# Patient Record
Sex: Female | Born: 1965
Health system: Southern US, Community
[De-identification: ages and names within clinical notes are randomized; demographics above are authoritative.]

## PROBLEM LIST (undated history)

## (undated) DIAGNOSIS — D649 Anemia, unspecified: Secondary | ICD-10-CM

## (undated) DIAGNOSIS — F419 Anxiety disorder, unspecified: Secondary | ICD-10-CM

## (undated) DIAGNOSIS — M1990 Unspecified osteoarthritis, unspecified site: Secondary | ICD-10-CM

## (undated) DIAGNOSIS — J189 Pneumonia, unspecified organism: Secondary | ICD-10-CM

## (undated) DIAGNOSIS — T7840XA Allergy, unspecified, initial encounter: Secondary | ICD-10-CM

## (undated) DIAGNOSIS — E039 Hypothyroidism, unspecified: Secondary | ICD-10-CM

## (undated) DIAGNOSIS — Z9889 Other specified postprocedural states: Secondary | ICD-10-CM

## (undated) DIAGNOSIS — F329 Major depressive disorder, single episode, unspecified: Secondary | ICD-10-CM

## (undated) DIAGNOSIS — I10 Essential (primary) hypertension: Secondary | ICD-10-CM

## (undated) DIAGNOSIS — R7303 Prediabetes: Secondary | ICD-10-CM

## (undated) DIAGNOSIS — R112 Nausea with vomiting, unspecified: Secondary | ICD-10-CM

## (undated) DIAGNOSIS — O24419 Gestational diabetes mellitus in pregnancy, unspecified control: Secondary | ICD-10-CM

## (undated) DIAGNOSIS — F32A Depression, unspecified: Secondary | ICD-10-CM

## (undated) HISTORY — DX: Gestational diabetes mellitus in pregnancy, unspecified control: O24.419

## (undated) HISTORY — DX: Allergy, unspecified, initial encounter: T78.40XA

## (undated) HISTORY — DX: Anemia, unspecified: D64.9

---

## 2013-02-06 ENCOUNTER — Encounter: Payer: Self-pay | Admitting: Internal Medicine

## 2013-02-12 ENCOUNTER — Ambulatory Visit (INDEPENDENT_AMBULATORY_CARE_PROVIDER_SITE_OTHER): Payer: 59 | Admitting: Internal Medicine

## 2013-02-12 ENCOUNTER — Encounter: Payer: Self-pay | Admitting: Internal Medicine

## 2013-02-12 VITALS — BP 150/90 | HR 83 | Temp 98.2°F | Ht 65.5 in | Wt 193.5 lb

## 2013-02-12 DIAGNOSIS — D649 Anemia, unspecified: Secondary | ICD-10-CM

## 2013-02-12 DIAGNOSIS — R030 Elevated blood-pressure reading, without diagnosis of hypertension: Secondary | ICD-10-CM

## 2013-02-12 DIAGNOSIS — E7800 Pure hypercholesterolemia, unspecified: Secondary | ICD-10-CM

## 2013-02-12 DIAGNOSIS — O24419 Gestational diabetes mellitus in pregnancy, unspecified control: Secondary | ICD-10-CM

## 2013-02-12 DIAGNOSIS — O99810 Abnormal glucose complicating pregnancy: Secondary | ICD-10-CM

## 2013-02-12 DIAGNOSIS — Z1239 Encounter for other screening for malignant neoplasm of breast: Secondary | ICD-10-CM

## 2013-02-15 ENCOUNTER — Telehealth: Payer: Self-pay | Admitting: Internal Medicine

## 2013-02-15 ENCOUNTER — Encounter: Payer: Self-pay | Admitting: Internal Medicine

## 2013-02-15 DIAGNOSIS — D649 Anemia, unspecified: Secondary | ICD-10-CM | POA: Insufficient documentation

## 2013-02-15 DIAGNOSIS — I10 Essential (primary) hypertension: Secondary | ICD-10-CM | POA: Insufficient documentation

## 2013-02-15 DIAGNOSIS — Z8632 Personal history of gestational diabetes: Secondary | ICD-10-CM | POA: Insufficient documentation

## 2013-02-15 NOTE — Assessment & Plan Note (Signed)
Elevated today.  Had issues during pregnancy.  Has not seen a doctor regularly.  Have her spot check her pressures.  Get her back in soon to reassess.  If persistent elevation, will require medication.  Check metabolic panel.

## 2013-02-15 NOTE — Assessment & Plan Note (Signed)
Noted as a child.  Check cbc.

## 2013-02-15 NOTE — Progress Notes (Signed)
Subjective:    Patient ID: Kristen Aguilar, female    DOB: [birth year], 47 y.o.   MRN: [id number]  HPI 47 year old female with past history of allergy problems as well as borderline blood pressure and sugar issues with pregnancy.  She comes in today to follow up on these issues as well as to establish care.  Daughter of Kristen Aguilar and Kristen Aguilar.  States her last physical was 10 years ago.  She has had one previous abnormal mammogram and was told she was drinking too much caffeine.  Has not had mammogram since.  Had borderline blood pressure and sugar during her pregnancy.  Also was told she was anemic - as a child.  Tries to stay active.  No cardiac symptoms with increased activity or exertion.  Breathing stable.  Bowels stable.  She is still having periods.  States she will have spotting approximately every three months.  Has been doing this for two years.  Has had a tubal ligation.  Some increased stress with her sons school issues.  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  Doing better now.  She feels she is handling stress relatively well now.     Past Medical History  Diagnosis Date  . Allergy   . Gestational diabetes   . Anemia     as a child    Outpatient Encounter Prescriptions as of 02/12/2013  Medication Sig Dispense Refill  . fish oil-omega-3 fatty acids 1000 MG capsule Take 2 g by mouth daily.      . Multiple Vitamin (MULTIVITAMIN WITH MINERALS) TABS Take 1 tablet by mouth daily.       No facility-administered encounter medications on file as of 02/12/2013.    Review of Systems Patient denies any headache, lightheadedness or dizziness.  No fever.  No significant sinus or allergy symptoms currently.  No chest pain, tightness or palpitations.  No increased shortness of breath, cough or congestion.  No nausea or vomiting.  No acid reflux. No abdominal pain or cramping.  No bowel change, such as diarrhea, constipation, BRBPR or melana.  No urine change.  No joint aches or rash.  Periods as outlined.   No vaginal problems.  Feels she is handling stress relatively well.       Objective:   Physical Exam Filed Vitals:   02/12/13 0913  BP: 150/90  Pulse: 83  Temp: 98.2 F (36.8 C)   Blood pressure recheck:  148/90, pulse 34  47 year old female in no acute distress.   HEENT:  Nares- clear.  Oropharynx - without lesions. NECK:  Supple.  Nontender.  No audible bruit.  HEART:  Appears to be regular. LUNGS:  No crackles or wheezing audible.  Respirations even and unlabored.  RADIAL PULSE:  Equal bilaterally.  ABDOMEN:  Soft, nontender.  Bowel sounds present and normal.  No audible abdominal bruit.    EXTREMITIES:  No increased edema present.  DP pulses palpable and equal bilaterally.      SKIN:  Tick present posterior right shoulder.  Minimal surrounding erythema.      Assessment & Plan:  GYN.  Periods as outlined.  Overdue pelvic/pap.  Schedule her for a physical next visit.   INCREASED PSYCHOSOCIAL STRESSORS.  Feels she is handling things relatively well.  Follow.   TICK BITE.  Attempted tick removal here in the office. Was able to remove all of the tick except for the head.  Discussed with surgery.  They will see her today for complete removal.  She tolerated procedure well.  Will monitor for fever, headaches, rash, joint aches, etc.    HEALTH MAINTENANCE.  Schedule a physical for next visit.  Schedule a mammogram.  Check labs including cholesterol.

## 2013-02-15 NOTE — Assessment & Plan Note (Signed)
Discussed importance of diet and weight loss.  Check metabolic panel and a1c.

## 2013-02-15 NOTE — Telephone Encounter (Signed)
Please schedule her for a fasting lab appt within the next 1-2 weeks and call her with an appt date and time.  Thanks.

## 2013-02-17 NOTE — Telephone Encounter (Signed)
Patient called back and is scheduled for 02/26/13 at 10:30.

## 2013-02-17 NOTE — Telephone Encounter (Signed)
Left message for pt to call office

## 2013-02-23 ENCOUNTER — Encounter: Payer: Self-pay | Admitting: Internal Medicine

## 2013-02-26 ENCOUNTER — Other Ambulatory Visit (INDEPENDENT_AMBULATORY_CARE_PROVIDER_SITE_OTHER): Payer: 59

## 2013-02-26 DIAGNOSIS — E7800 Pure hypercholesterolemia, unspecified: Secondary | ICD-10-CM

## 2013-02-26 DIAGNOSIS — R030 Elevated blood-pressure reading, without diagnosis of hypertension: Secondary | ICD-10-CM

## 2013-02-26 LAB — COMPREHENSIVE METABOLIC PANEL
ALT: 16 U/L (ref 0–35)
AST: 19 U/L (ref 0–37)
CO2: 26 mEq/L (ref 19–32)
Calcium: 9.3 mg/dL (ref 8.4–10.5)
Chloride: 103 mEq/L (ref 96–112)
Creatinine, Ser: 0.9 mg/dL (ref 0.4–1.2)
Sodium: 138 mEq/L (ref 135–145)
Total Protein: 7.4 g/dL (ref 6.0–8.3)

## 2013-02-26 LAB — LIPID PANEL
HDL: 51.6 mg/dL (ref >39.00)
Total CHOL/HDL Ratio: 4
Triglycerides: 120 mg/dL (ref 0.0–149.0)

## 2013-02-26 LAB — CBC WITH DIFFERENTIAL/PLATELET
Basophils Absolute: 0 10*3/uL (ref 0.0–0.1)
Eosinophils Absolute: 0.3 10*3/uL (ref 0.0–0.7)
Hemoglobin: 14.2 g/dL (ref 12.0–15.0)
Lymphocytes Relative: 26.4 % (ref 12.0–46.0)
MCHC: 34 g/dL (ref 30.0–36.0)
Monocytes Relative: 6 % (ref 3.0–12.0)
Neutro Abs: 6 10*3/uL (ref 1.4–7.7)
Neutrophils Relative %: 64.4 % (ref 43.0–77.0)
Platelets: 306 10*3/uL (ref 150.0–400.0)
RDW: 13.4 % (ref 11.5–14.6)

## 2013-03-02 ENCOUNTER — Encounter: Payer: Self-pay | Admitting: *Deleted

## 2013-03-12 ENCOUNTER — Encounter: Payer: Self-pay | Admitting: Internal Medicine

## 2013-04-02 ENCOUNTER — Ambulatory Visit (INDEPENDENT_AMBULATORY_CARE_PROVIDER_SITE_OTHER): Payer: 59 | Admitting: Adult Health

## 2013-04-02 ENCOUNTER — Encounter: Payer: Self-pay | Admitting: Adult Health

## 2013-04-02 VITALS — BP 158/78 | HR 78 | Temp 98.1°F | Resp 12 | Wt 192.5 lb

## 2013-04-02 DIAGNOSIS — W268XXA Contact with other sharp object(s), not elsewhere classified, initial encounter: Secondary | ICD-10-CM

## 2013-04-02 DIAGNOSIS — Z23 Encounter for immunization: Secondary | ICD-10-CM

## 2013-04-02 DIAGNOSIS — S91309A Unspecified open wound, unspecified foot, initial encounter: Secondary | ICD-10-CM

## 2013-04-02 DIAGNOSIS — S91331A Puncture wound without foreign body, right foot, initial encounter: Secondary | ICD-10-CM

## 2013-04-02 DIAGNOSIS — S91339A Puncture wound without foreign body, unspecified foot, initial encounter: Secondary | ICD-10-CM | POA: Insufficient documentation

## 2013-04-02 NOTE — Patient Instructions (Signed)
  Tdap vaccine given today.  You were given a separate handout related to this vaccine.

## 2013-04-02 NOTE — Assessment & Plan Note (Signed)
Occurred while gardening. Pt stepped on rusty nail and punctured the right heel. Continue with topical antibacterial ointment. Keep area clean. Tdap given. Report any s/s of infection such as fever, redness or swelling around the area, drainage.

## 2013-04-02 NOTE — Progress Notes (Signed)
  Subjective:    Patient ID: Kristen Aguilar, female    DOB: 1966/05/03, 47 y.o.   MRN: [id number]  HPI  Patient is a pleasant 47 year old female who presents to clinic after stepping on a rusty nail while gardening. She had shoes on. The nail went through the shoe and broke the skin on her right heel. She removed the nail and noticed the area was bleeding. She cleaned it up and apply topical antibacterial ointment and covered it with a Band-Aid. Patient has no other complaints. Reports last tetanus shot > 10 years ago.   Current Outpatient Prescriptions on File Prior to Visit  Medication Sig Dispense Refill  . fish oil-omega-3 fatty acids 1000 MG capsule Take 2 g by mouth daily.      . Multiple Vitamin (MULTIVITAMIN WITH MINERALS) TABS Take 1 tablet by mouth daily.       No current facility-administered medications on file prior to visit.     Review of Systems Skin - right heel injury after stepping on a rusty nail yesterday. Area was bleeding at the time. Denies bleeding now. Denies any other drainage.  BP 158/78  Pulse 78  Temp(Src) 98.1 F (36.7 C) (Oral)  Resp 12  Wt 192 lb 8 oz (87.317 kg)  BMI 31.53 kg/m2  SpO2 98%     Objective:   Physical Exam  Skin: Right heel puncture wound w/o evidence of infection. There is no erythema surrounding the area. There is no edema. Pt is afebrile.     Assessment & Plan:

## 2013-04-03 ENCOUNTER — Encounter: Payer: 59 | Admitting: Internal Medicine

## 2013-04-30 ENCOUNTER — Encounter: Payer: Self-pay | Admitting: Internal Medicine

## 2013-05-15 ENCOUNTER — Encounter: Payer: 59 | Admitting: Internal Medicine

## 2013-06-18 ENCOUNTER — Encounter: Payer: 59 | Admitting: Internal Medicine

## 2013-07-07 ENCOUNTER — Encounter: Payer: Self-pay | Admitting: Internal Medicine

## 2013-07-07 ENCOUNTER — Other Ambulatory Visit (HOSPITAL_COMMUNITY)
Admission: RE | Admit: 2013-07-07 | Discharge: 2013-07-07 | Disposition: A | Discharge status: Home / Self Care | Payer: 59 | Source: Ambulatory Visit | Attending: Internal Medicine | Admitting: Internal Medicine

## 2013-07-07 ENCOUNTER — Ambulatory Visit (INDEPENDENT_AMBULATORY_CARE_PROVIDER_SITE_OTHER): Payer: 59 | Admitting: Internal Medicine

## 2013-07-07 VITALS — BP 130/100 | HR 76 | Temp 98.2°F | Ht 65.25 in | Wt 201.5 lb

## 2013-07-07 DIAGNOSIS — Z124 Encounter for screening for malignant neoplasm of cervix: Secondary | ICD-10-CM

## 2013-07-07 DIAGNOSIS — Z1151 Encounter for screening for human papillomavirus (HPV): Secondary | ICD-10-CM | POA: Insufficient documentation

## 2013-07-07 DIAGNOSIS — D649 Anemia, unspecified: Secondary | ICD-10-CM

## 2013-07-07 DIAGNOSIS — O99810 Abnormal glucose complicating pregnancy: Secondary | ICD-10-CM

## 2013-07-07 DIAGNOSIS — Z01419 Encounter for gynecological examination (general) (routine) without abnormal findings: Secondary | ICD-10-CM | POA: Insufficient documentation

## 2013-07-07 DIAGNOSIS — O24419 Gestational diabetes mellitus in pregnancy, unspecified control: Secondary | ICD-10-CM

## 2013-07-07 DIAGNOSIS — I10 Essential (primary) hypertension: Secondary | ICD-10-CM

## 2013-07-07 MED ORDER — LISINOPRIL 10 MG PO TABS: 10.0000 mg | ORAL_TABLET | Freq: Every day | ORAL | Status: DC

## 2013-07-07 NOTE — Progress Notes (Signed)
  Subjective:    Patient ID: Kristen Aguilar, female    DOB: [birth year], 47 y.o.   MRN: [id number]  HPI 47 year old female with past history of allergy problems as well as borderline blood pressure and sugar issues with pregnancy.  She comes in today to follow up on these issues as well as for a complete physical exam.  Had borderline blood pressure and sugar during her pregnancy.  Also was told she was anemic - as a child.  Tries to stay active.  No cardiac symptoms with increased activity or exertion.  Breathing stable.  Bowels stable.  She is still having periods.  LMP 6/14.  Has periods every two to three months.   Has had a tubal ligation.   She feels she is handling stress relatively well now.  States her blood pressure is averaging 120-130/90-95.  LMP 6/14.     Past Medical History  Diagnosis Date  . Allergy   . Gestational diabetes   . Anemia     as a child    Outpatient Encounter Prescriptions as of 07/07/2013  Medication Sig Dispense Refill  . fish oil-omega-3 fatty acids 1000 MG capsule Take 2 g by mouth daily.      . Multiple Vitamin (MULTIVITAMIN WITH MINERALS) TABS Take 1 tablet by mouth daily.       No facility-administered encounter medications on file as of 07/07/2013.    Review of Systems Patient denies any headache, lightheadedness or dizziness.  No fever.  No significant sinus or allergy symptoms currently.  No chest pain, tightness or palpitations.  No increased shortness of breath, cough or congestion.  No nausea or vomiting.  No acid reflux. No abdominal pain or cramping.  No bowel change, such as diarrhea, constipation, BRBPR or melana.  No urine change.  No joint aches or rash.  Periods as outlined.  No vaginal problems.  Feels she is handling stress relatively well.       Objective:   Physical Exam  Filed Vitals:   07/07/13 0950  BP: 130/100  Pulse: 76  Temp: 98.2 F (36.8 C)   Blood pressure recheck:  146/84, pulse 37  46 year old female in no acute  distress.   HEENT:  Nares- clear.  Oropharynx - without lesions. NECK:  Supple.  Nontender.  No audible bruit.  HEART:  Appears to be regular. LUNGS:  No crackles or wheezing audible.  Respirations even and unlabored.  RADIAL PULSE:  Equal bilaterally.    BREASTS:  No nipple discharge or nipple retraction present.  Could not appreciate any distinct nodules or axillary adenopathy.  ABDOMEN:  Soft, nontender.  Bowel sounds present and normal.  No audible abdominal bruit.  GU:  Normal external genitalia.  Vaginal vault without lesions.  Cervix identified.  Pap performed. Could not appreciate any adnexal masses or tenderness.   EXTREMITIES:  No increased edema present.  DP pulses palpable and equal bilaterally.          Assessment & Plan:  GYN.  Periods as outlined.  Pelvic/ pap today.   INCREASED PSYCHOSOCIAL STRESSORS.  Feels she is handling things relatively well.  Follow.     HEALTH MAINTENANCE.  Physical today.  Pap performed.  Mammogram 03/11/13 - Bir[facility] I.

## 2013-07-09 ENCOUNTER — Encounter: Payer: Self-pay | Admitting: *Deleted

## 2013-07-11 ENCOUNTER — Encounter: Payer: Self-pay | Admitting: Internal Medicine

## 2013-07-11 NOTE — Assessment & Plan Note (Signed)
Blood pressure elevated.  Start lisinopril 10mg  q day.  Follow pressures.  Get her back in soon to reassess.  Check met b in 10-14 days.

## 2013-07-11 NOTE — Assessment & Plan Note (Signed)
Discussed importance of diet and weight loss.  Follow metabolic panel and a1c.

## 2013-07-11 NOTE — Assessment & Plan Note (Addendum)
Noted as a child.  Recent hgb 14 (5/14).

## 2013-08-07 ENCOUNTER — Encounter: Payer: Self-pay | Admitting: Internal Medicine

## 2013-08-07 ENCOUNTER — Ambulatory Visit (INDEPENDENT_AMBULATORY_CARE_PROVIDER_SITE_OTHER): Payer: 59 | Admitting: Internal Medicine

## 2013-08-07 VITALS — BP 130/80 | HR 75 | Temp 98.0°F | Ht 65.25 in | Wt 198.0 lb

## 2013-08-07 DIAGNOSIS — D649 Anemia, unspecified: Secondary | ICD-10-CM

## 2013-08-07 DIAGNOSIS — I10 Essential (primary) hypertension: Secondary | ICD-10-CM

## 2013-08-07 DIAGNOSIS — O24419 Gestational diabetes mellitus in pregnancy, unspecified control: Secondary | ICD-10-CM

## 2013-08-07 DIAGNOSIS — O99810 Abnormal glucose complicating pregnancy: Secondary | ICD-10-CM

## 2013-08-07 LAB — BASIC METABOLIC PANEL
BUN: 13 mg/dL (ref 6–23)
Calcium: 9.5 mg/dL (ref 8.4–10.5)
Creatinine, Ser: 0.9 mg/dL (ref 0.4–1.2)
GFR: 74.67 mL/min (ref >60.00)
Glucose, Bld: 100 mg/dL — ABNORMAL HIGH (ref 70–99)

## 2013-08-08 ENCOUNTER — Encounter: Payer: Self-pay | Admitting: Internal Medicine

## 2013-08-10 ENCOUNTER — Encounter: Payer: Self-pay | Admitting: Internal Medicine

## 2013-08-10 NOTE — Telephone Encounter (Signed)
Mailed unread message to pt  

## 2013-08-10 NOTE — Assessment & Plan Note (Signed)
Noted as a child.  Recent hgb 14 (5/14).

## 2013-08-10 NOTE — Progress Notes (Signed)
  Subjective:    Patient ID: Kristen Aguilar, female    DOB: Feb 25, 1966, 47 y.o.   MRN: [id number]  HPI 47 year old female with past history of allergy problems and gestational diabetes.  She was just recently diagnosed with hypertension and started on Lisinopril.  Had borderline blood pressure and sugar during her pregnancy.  Tries to stay active.  No cardiac symptoms with increased activity or exertion.  Breathing stable.  Bowels stable.  She is still having periods.  Has periods every two to three months.   Has had a tubal ligation.   She feels she is handling stress relatively well now.  States her blood pressure is averaging 113-120s/70s.  Tolerating Lisinopril.       Past Medical History  Diagnosis Date  . Allergy   . Gestational diabetes   . Anemia     as a child    Outpatient Encounter Prescriptions as of 08/07/2013  Medication Sig Dispense Refill  . fish oil-omega-3 fatty acids 1000 MG capsule Take 2 g by mouth daily.      Marland Kitchen lisinopril (PRINIVIL,ZESTRIL) 10 MG tablet Take 1 tablet (10 mg total) by mouth daily.  30 tablet  1  . Multiple Vitamin (MULTIVITAMIN WITH MINERALS) TABS Take 1 tablet by mouth daily.       No facility-administered encounter medications on file as of 08/07/2013.    Review of Systems Patient denies any headache, lightheadedness or dizziness.  No fever.  No significant sinus or allergy symptoms currently.  No chest pain, tightness or palpitations.  No increased shortness of breath, cough or congestion.  No nausea or vomiting.  No acid reflux. No abdominal pain or cramping.  No bowel change, such as diarrhea, constipation, BRBPR or melana.  No urine change.  Feels she is handling stress relatively well.       Objective:   Physical Exam  Filed Vitals:   08/07/13 1003  BP: 130/80  Pulse: 75  Temp: 98 F (36.7 C)   Blood pressure recheck:  70/18  47 year old female in no acute distress.   HEENT:  Nares- clear.  Oropharynx - without lesions. NECK:   Supple.  Nontender.  No audible bruit.  HEART:  Appears to be regular. LUNGS:  No crackles or wheezing audible.  Respirations even and unlabored.  RADIAL PULSE:  Equal bilaterally.    ABDOMEN:  Soft, nontender.  Bowel sounds present and normal.  No audible abdominal bruit.   EXTREMITIES:  No increased edema present.  DP pulses palpable and equal bilaterally.          Assessment & Plan:  GYN.  Periods as outlined.  Pap 07/07/13 - negative with negative HPV.    INCREASED PSYCHOSOCIAL STRESSORS.  Feels she is handling things relatively well.  Follow.     HEALTH MAINTENANCE.  Physical 07/07/13.   Mammogram 03/11/13 - Birads I.

## 2013-08-10 NOTE — Assessment & Plan Note (Signed)
Have discussed importance of diet and weight loss.  Follow metabolic panel and a1c.  

## 2013-08-10 NOTE — Assessment & Plan Note (Signed)
On lisinopril 10mg q day.  Doing well.  Pressures look good.  Check metabolic panel today.    

## 2013-09-04 ENCOUNTER — Other Ambulatory Visit: Payer: Self-pay | Admitting: *Deleted

## 2013-09-04 MED ORDER — LISINOPRIL 10 MG PO TABS: 10.0000 mg | ORAL_TABLET | Freq: Every day | ORAL | Status: DC

## 2013-11-13 ENCOUNTER — Encounter: Payer: Self-pay | Admitting: Internal Medicine

## 2013-11-13 ENCOUNTER — Ambulatory Visit (INDEPENDENT_AMBULATORY_CARE_PROVIDER_SITE_OTHER): Payer: 59 | Admitting: Internal Medicine

## 2013-11-13 ENCOUNTER — Other Ambulatory Visit: Payer: Self-pay | Admitting: Internal Medicine

## 2013-11-13 VITALS — BP 130/90 | HR 73 | Temp 98.4°F | Ht 65.25 in | Wt 198.5 lb

## 2013-11-13 DIAGNOSIS — D649 Anemia, unspecified: Secondary | ICD-10-CM

## 2013-11-13 DIAGNOSIS — I10 Essential (primary) hypertension: Secondary | ICD-10-CM

## 2013-11-13 DIAGNOSIS — O99810 Abnormal glucose complicating pregnancy: Secondary | ICD-10-CM

## 2013-11-13 DIAGNOSIS — O24419 Gestational diabetes mellitus in pregnancy, unspecified control: Secondary | ICD-10-CM

## 2013-11-13 NOTE — Assessment & Plan Note (Addendum)
On lisinopril 10mg  q day.  Doing well.  Pressures look good.  Check metabolic panel today.

## 2013-11-13 NOTE — Progress Notes (Signed)
Order placed for labs.

## 2013-11-13 NOTE — Progress Notes (Signed)
Pre-visit discussion using our clinic review tool. No additional management support is needed unless otherwise documented below in the visit note.  

## 2013-11-15 ENCOUNTER — Encounter: Payer: Self-pay | Admitting: Internal Medicine

## 2013-11-15 NOTE — Assessment & Plan Note (Signed)
Noted as a child.  Recent hgb 14 (5/14).  Follow.

## 2013-11-15 NOTE — Progress Notes (Signed)
  Subjective:    Patient ID: Kristen Aguilar, female    DOB: Nov 27, 1965, 48 y.o.   MRN: [id number]  HPI [age] year old female with past history of allergy problems and gestational diabetes.  She was just recently diagnosed with hypertension and started on Lisinopril.  Comes in today for a scheduled follow up.  Had borderline blood pressure and sugar during her pregnancy.  Tries to stay active.  No cardiac symptoms with increased activity or exertion.  Breathing stable.  Bowels stable.   Has had a tubal ligation.   She feels she is handling stress relatively well now.  States her blood pressure is averaging 118-120s/70s.  Tolerating Lisinopril.    Overall she feels she is doing well.     Past Medical History  Diagnosis Date  . Allergy   . Gestational diabetes   . Anemia     as a child    Outpatient Encounter Prescriptions as of 11/13/2013  Medication Sig  . fish oil-omega-3 fatty acids 1000 MG capsule Take 2 g by mouth daily.  Marland Kitchen. lisinopril (PRINIVIL,ZESTRIL) 10 MG tablet Take 1 tablet (10 mg total) by mouth daily.  . Multiple Vitamin (MULTIVITAMIN WITH MINERALS) TABS Take 1 tablet by mouth daily.    Review of Systems Patient denies any headache, lightheadedness or dizziness.  No fever.  No significant sinus or allergy symptoms currently.  No chest pain, tightness or palpitations.  No increased shortness of breath, cough or congestion.  No nausea or vomiting.  No acid reflux. No abdominal pain or cramping.  No bowel change, such as diarrhea, constipation, BRBPR or melana.  No urine change.  Feels she is handling stress relatively well.       Objective:   Physical Exam  Filed Vitals:   11/13/13 0929  BP: 130/90  Pulse: 73  Temp: 98.4 F (36.9 C)   Blood pressure recheck:  73134/3682  [age] year old female in no acute distress.   HEENT:  Nares- clear.  Oropharynx - without lesions. NECK:  Supple.  Nontender.  No audible bruit.  HEART:  Appears to be regular. LUNGS:  No crackles or wheezing  audible.  Respirations even and unlabored.  RADIAL PULSE:  Equal bilaterally.    ABDOMEN:  Soft, nontender.  Bowel sounds present and normal.  No audible abdominal bruit.   EXTREMITIES:  No increased edema present.  DP pulses palpable and equal bilaterally.          Assessment & Plan:  GYN.  Periods as outlined.  Pap 07/07/13 - negative with negative HPV.    INCREASED PSYCHOSOCIAL STRESSORS.  Feels she is handling things relatively well.  Follow.     HEALTH MAINTENANCE.  Physical 07/07/13.   Mammogram 03/11/13 - Birads I.

## 2013-11-15 NOTE — Assessment & Plan Note (Signed)
Have discussed importance of diet and weight loss.  Follow metabolic panel and a1c.

## 2013-11-20 ENCOUNTER — Other Ambulatory Visit (INDEPENDENT_AMBULATORY_CARE_PROVIDER_SITE_OTHER): Payer: 59

## 2013-11-20 DIAGNOSIS — I10 Essential (primary) hypertension: Secondary | ICD-10-CM

## 2013-11-23 LAB — BASIC METABOLIC PANEL
BUN: 13 mg/dL (ref 6–23)
CHLORIDE: 103 meq/L (ref 96–112)
CO2: 25 meq/L (ref 19–32)
Calcium: 9.8 mg/dL (ref 8.4–10.5)
Creatinine, Ser: 0.9 mg/dL (ref 0.4–1.2)
GFR: 71.68 mL/min (ref >60.00)
GLUCOSE: 80 mg/dL (ref 70–99)
POTASSIUM: 4.2 meq/L (ref 3.5–5.1)
SODIUM: 138 meq/L (ref 135–145)

## 2013-11-23 LAB — LIPID PANEL
Cholesterol: 247 mg/dL — ABNORMAL HIGH (ref 0–200)
HDL: 51.7 mg/dL (ref >39.00)
TRIGLYCERIDES: 147 mg/dL (ref 0.0–149.0)
Total CHOL/HDL Ratio: 5
VLDL: 29.4 mg/dL (ref 0.0–40.0)

## 2013-11-24 LAB — LDL CHOLESTEROL, DIRECT: LDL DIRECT: 182.3 mg/dL

## 2013-11-25 ENCOUNTER — Encounter: Payer: Self-pay | Admitting: Internal Medicine

## 2013-11-30 NOTE — Telephone Encounter (Signed)
Mailed a copy of MyChart email to pt, and requested for pt to contact office or reply to [employer] email message

## 2014-01-05 ENCOUNTER — Ambulatory Visit (INDEPENDENT_AMBULATORY_CARE_PROVIDER_SITE_OTHER): Payer: 59 | Admitting: Internal Medicine

## 2014-01-05 ENCOUNTER — Encounter: Payer: Self-pay | Admitting: Internal Medicine

## 2014-01-05 VITALS — BP 130/80 | HR 93 | Temp 98.8°F | Ht 65.25 in | Wt 196.8 lb

## 2014-01-05 DIAGNOSIS — T7840XA Allergy, unspecified, initial encounter: Secondary | ICD-10-CM

## 2014-01-05 DIAGNOSIS — R21 Rash and other nonspecific skin eruption: Secondary | ICD-10-CM

## 2014-01-05 NOTE — Patient Instructions (Signed)
Take zyrtec - one daily. Steroid taper as directed.

## 2014-01-05 NOTE — Progress Notes (Signed)
Pre-visit discussion using our clinic review tool. No additional management support is needed unless otherwise documented below in the visit note.  

## 2014-01-09 ENCOUNTER — Encounter: Payer: Self-pay | Admitting: Internal Medicine

## 2014-01-09 DIAGNOSIS — R21 Rash and other nonspecific skin eruption: Secondary | ICD-10-CM | POA: Insufficient documentation

## 2014-01-09 NOTE — Assessment & Plan Note (Signed)
Rash as outlined.  Appears to be allergic.  Unclear as to the trigger.  Medrol dose pack as directed.  Zyrtec as directed.  Follow.  Needs allergy testing.  This has been a reoccurring issue for her - just not as severe.

## 2014-01-09 NOTE — Progress Notes (Signed)
  Subjective:    Patient ID: Kristen Aguilar, female    DOB: [birth year], 48 y.o.   MRN: [id number]  Rash  48 year old female with past history of allergy problems and gestational diabetes.  She was just recently diagnosed with hypertension and started on Lisinopril.  Comes in today as a work in with concerns regarding a rash.  States she noticed some irritation on her back and eck.  Some itching.  Then spread to her upper legs and upper abdomen.  Rash persist.  Now involves multiple areas of her body.  Had some diarrhea this am.  Not persistent.  Eating and drinking well.  No headache.  No new exposures.  Has not ingested anything different.     Past Medical History  Diagnosis Date  . Allergy   . Gestational diabetes   . Anemia     as a child    Outpatient Encounter Prescriptions as of 01/05/2014  Medication Sig  . fish oil-omega-3 fatty acids 1000 MG capsule Take 2 g by mouth daily.  Marland Kitchen. lisinopril (PRINIVIL,ZESTRIL) 10 MG tablet Take 1 tablet (10 mg total) by mouth daily.  . Multiple Vitamin (MULTIVITAMIN WITH MINERALS) TABS Take 1 tablet by mouth daily.  Marland Kitchen. OVER THE COUNTER MEDICATION [employer]- Heart health supplement    Review of Systems  Skin: Positive for rash.  Patient denies any headache.   No fever.  No significant sinus or allergy symptoms currently.  No chest pain, tightness or palpitations.  No increased shortness of breath or congestion.  Some cough previously.   No nausea or vomiting.  No acid reflux. No abdominal pain or cramping.  No bowel change, such as constipation, BRBPR or melana.  Some diarrhea this am.   Rash as outlined.        Objective:   Physical Exam  Filed Vitals:   01/05/14 1458  BP: 130/80  Pulse: 93  Temp: 98.8 F (7037.681 C)   48 year old female in no acute distress.   HEENT:  Nares- clear.  Oropharynx - without lesions. NECK:  Supple.  Nontender.  No audible bruit.  HEART:  Appears to be regular. LUNGS:  No crackles or wheezing audible.   Respirations even and unlabored.  RADIAL PULSE:  Equal bilaterally.    ABDOMEN:  Soft, nontender.  Bowel sounds present and normal.  No audible abdominal bruit.   EXTREMITIES:  No increased edema present.  DP pulses palpable and equal bilaterally.   SKIN:  Appears to have hives multiple areas of her body.  No facial involement.          Assessment & Plan:  HEALTH MAINTENANCE.  Physical 07/07/13.   Mammogram 03/11/13 - Birads I.

## 2014-01-18 ENCOUNTER — Ambulatory Visit (INDEPENDENT_AMBULATORY_CARE_PROVIDER_SITE_OTHER): Payer: 59 | Admitting: Internal Medicine

## 2014-01-18 ENCOUNTER — Encounter: Payer: Self-pay | Admitting: Internal Medicine

## 2014-01-18 VITALS — BP 140/80 | HR 84 | Temp 98.3°F | Ht 65.25 in | Wt 198.0 lb

## 2014-01-18 DIAGNOSIS — O24419 Gestational diabetes mellitus in pregnancy, unspecified control: Secondary | ICD-10-CM

## 2014-01-18 DIAGNOSIS — O99810 Abnormal glucose complicating pregnancy: Secondary | ICD-10-CM

## 2014-01-18 DIAGNOSIS — D649 Anemia, unspecified: Secondary | ICD-10-CM

## 2014-01-18 DIAGNOSIS — R21 Rash and other nonspecific skin eruption: Secondary | ICD-10-CM

## 2014-01-18 DIAGNOSIS — E7800 Pure hypercholesterolemia, unspecified: Secondary | ICD-10-CM

## 2014-01-18 DIAGNOSIS — I10 Essential (primary) hypertension: Secondary | ICD-10-CM

## 2014-01-18 NOTE — Progress Notes (Signed)
Pre-visit discussion using our clinic review tool. No additional management support is needed unless otherwise documented below in the visit note.  

## 2014-01-20 ENCOUNTER — Encounter: Payer: Self-pay | Admitting: Internal Medicine

## 2014-01-20 NOTE — Assessment & Plan Note (Signed)
Have discussed importance of diet and weight loss.  Follow metabolic panel and a1c.

## 2014-01-20 NOTE — Assessment & Plan Note (Signed)
On lisinopril 10mg  q day.  Doing well.  Pressures look good.  Follow metabolic panel.

## 2014-01-20 NOTE — Assessment & Plan Note (Signed)
Noted as a child.  Recent hgb 14 (5/14).  Follow.

## 2014-01-20 NOTE — Assessment & Plan Note (Signed)
Rash resolved.  Appears to be allergy trigger.  Unclear as to the trigger.  Taking Zyrtec daily.  Follow.  Needs allergy testing.  This has been a reoccurring issue for her - just not as severe.  Has and appt with an allergist.

## 2014-01-20 NOTE — Progress Notes (Signed)
  Subjective:    Patient ID: Janeece AgeeChristine Kos, female    DOB: Jan 01, 1966, 48 y.o.   MRN: [id number]  HPI 48 year old female with past history of allergy problems and gestational diabetes.  She was just recently diagnosed with hypertension and started on Lisinopril.  Comes in today for a scheduled follow up.  Had borderline blood pressure and sugar during her pregnancy.  Tries to stay active.  No cardiac symptoms with increased activity or exertion.  Breathing stable.  Bowels stable.   Has had a tubal ligation.   She feels she is handling stress relatively well now.  States her blood pressure is averaging in the 120s systolic readings.  Tolerating Lisinopril.    Overall she feels she is doing well.  Previous rash resolved.  Planning to see the allergist for evaluation.     Past Medical History  Diagnosis Date  . Allergy   . Gestational diabetes   . Anemia     as a child    Outpatient Encounter Prescriptions as of 01/18/2014  Medication Sig  . cetirizine (ZYRTEC) 10 MG tablet Take 10 mg by mouth daily.  . fish oil-omega-3 fatty acids 1000 MG capsule Take 2 g by mouth daily.  Marland Kitchen. lisinopril (PRINIVIL,ZESTRIL) 10 MG tablet Take 1 tablet (10 mg total) by mouth daily.  . Multiple Vitamin (MULTIVITAMIN WITH MINERALS) TABS Take 1 tablet by mouth daily.  Marland Kitchen. OVER THE COUNTER MEDICATION [employer]- Heart health supplement    Review of Systems Patient denies any headache, lightheadedness or dizziness.  No significant sinus or allergy symptoms currently.  No chest pain, tightness or palpitations.  No increased shortness of breath, cough or congestion.  No nausea or vomiting.  No acid reflux. No abdominal pain or cramping.  No bowel change, such as diarrhea, constipation, BRBPR or melana.  No urine change.  Feels she is handling stress relatively well.       Objective:   Physical Exam  Filed Vitals:   01/18/14 1101  BP: 140/80  Pulse: 84  Temp: 98.3 F (36.8 C)   Blood pressure recheck:   43134/3478  48 year old female in no acute distress.   HEENT:  Nares- clear.  Oropharynx - without lesions. NECK:  Supple.  Nontender.  No audible bruit.  HEART:  Appears to be regular. LUNGS:  No crackles or wheezing audible.  Respirations even and unlabored.  RADIAL PULSE:  Equal bilaterally.    ABDOMEN:  Soft, nontender.  Bowel sounds present and normal.  No audible abdominal bruit.   EXTREMITIES:  No increased edema present.  DP pulses palpable and equal bilaterally.          Assessment & Plan:  GYN.  Periods as outlined.  Pap 07/07/13 - negative with negative HPV.    INCREASED PSYCHOSOCIAL STRESSORS.  Feels she is handling things relatively well.  Follow.     HEALTH MAINTENANCE.  Physical 07/07/13.   Mammogram 03/11/13 - Birads I.

## 2014-02-27 ENCOUNTER — Encounter: Payer: Self-pay | Admitting: Internal Medicine

## 2014-03-01 MED ORDER — LISINOPRIL 10 MG PO TABS: 10.0000 mg | ORAL_TABLET | Freq: Every day | ORAL | Status: DC

## 2014-04-20 ENCOUNTER — Other Ambulatory Visit (INDEPENDENT_AMBULATORY_CARE_PROVIDER_SITE_OTHER): Payer: 59

## 2014-04-20 DIAGNOSIS — O24419 Gestational diabetes mellitus in pregnancy, unspecified control: Secondary | ICD-10-CM

## 2014-04-20 DIAGNOSIS — O99810 Abnormal glucose complicating pregnancy: Secondary | ICD-10-CM

## 2014-04-20 DIAGNOSIS — I10 Essential (primary) hypertension: Secondary | ICD-10-CM

## 2014-04-20 DIAGNOSIS — D649 Anemia, unspecified: Secondary | ICD-10-CM

## 2014-04-20 DIAGNOSIS — E7800 Pure hypercholesterolemia, unspecified: Secondary | ICD-10-CM

## 2014-04-20 LAB — LIPID PANEL
Cholesterol: 222 mg/dL — ABNORMAL HIGH (ref 0–200)
HDL: 47.7 mg/dL (ref >39.00)
LDL CALC: 131 mg/dL — AB (ref 0–99)
NONHDL: 174.3
Total CHOL/HDL Ratio: 5
Triglycerides: 215 mg/dL — ABNORMAL HIGH (ref 0.0–149.0)
VLDL: 43 mg/dL — AB (ref 0.0–40.0)

## 2014-04-20 LAB — COMPREHENSIVE METABOLIC PANEL
ALBUMIN: 4.1 g/dL (ref 3.5–5.2)
ALK PHOS: 72 U/L (ref 39–117)
ALT: 19 U/L (ref 0–35)
AST: 17 U/L (ref 0–37)
BUN: 13 mg/dL (ref 6–23)
CALCIUM: 9.8 mg/dL (ref 8.4–10.5)
CHLORIDE: 101 meq/L (ref 96–112)
CO2: 26 mEq/L (ref 19–32)
Creatinine, Ser: 0.9 mg/dL (ref 0.4–1.2)
GFR: 72.5 mL/min (ref >60.00)
Glucose, Bld: 90 mg/dL (ref 70–99)
POTASSIUM: 5 meq/L (ref 3.5–5.1)
SODIUM: 138 meq/L (ref 135–145)
TOTAL PROTEIN: 7.6 g/dL (ref 6.0–8.3)
Total Bilirubin: 0.5 mg/dL (ref 0.2–1.2)

## 2014-04-20 LAB — CBC WITH DIFFERENTIAL/PLATELET
Basophils Absolute: 0.1 10*3/uL (ref 0.0–0.1)
Basophils Relative: 0.6 % (ref 0.0–3.0)
EOS PCT: 2.8 % (ref 0.0–5.0)
Eosinophils Absolute: 0.2 10*3/uL (ref 0.0–0.7)
HCT: 42.8 % (ref 36.0–46.0)
HEMOGLOBIN: 14.1 g/dL (ref 12.0–15.0)
Lymphocytes Relative: 30.4 % (ref 12.0–46.0)
Lymphs Abs: 2.6 10*3/uL (ref 0.7–4.0)
MCHC: 33 g/dL (ref 30.0–36.0)
MCV: 90.1 fl (ref 78.0–100.0)
MONOS PCT: 6.6 % (ref 3.0–12.0)
Monocytes Absolute: 0.6 10*3/uL (ref 0.1–1.0)
NEUTROS ABS: 5.1 10*3/uL (ref 1.4–7.7)
Neutrophils Relative %: 59.6 % (ref 43.0–77.0)
Platelets: 315 10*3/uL (ref 150.0–400.0)
RBC: 4.75 Mil/uL (ref 3.87–5.11)
RDW: 13.3 % (ref 11.5–15.5)
WBC: 8.6 10*3/uL (ref 4.0–10.5)

## 2014-04-21 LAB — TSH: TSH: 4.21 u[IU]/mL (ref 0.35–4.50)

## 2014-04-22 ENCOUNTER — Encounter: Payer: Self-pay | Admitting: Internal Medicine

## 2014-04-22 ENCOUNTER — Ambulatory Visit (INDEPENDENT_AMBULATORY_CARE_PROVIDER_SITE_OTHER): Payer: 59 | Admitting: Internal Medicine

## 2014-04-22 VITALS — BP 130/78 | HR 78 | Temp 98.3°F | Ht 65.25 in | Wt 198.0 lb

## 2014-04-22 DIAGNOSIS — Z1239 Encounter for other screening for malignant neoplasm of breast: Secondary | ICD-10-CM

## 2014-04-22 DIAGNOSIS — E7800 Pure hypercholesterolemia, unspecified: Secondary | ICD-10-CM

## 2014-04-22 DIAGNOSIS — R21 Rash and other nonspecific skin eruption: Secondary | ICD-10-CM

## 2014-04-22 DIAGNOSIS — I10 Essential (primary) hypertension: Secondary | ICD-10-CM

## 2014-04-22 DIAGNOSIS — D649 Anemia, unspecified: Secondary | ICD-10-CM

## 2014-04-22 NOTE — Progress Notes (Signed)
Pre visit review using our clinic review tool, if applicable. No additional management support is needed unless otherwise documented below in the visit note. 

## 2014-04-25 ENCOUNTER — Encounter: Payer: Self-pay | Admitting: Internal Medicine

## 2014-04-25 DIAGNOSIS — E7849 Other hyperlipidemia: Secondary | ICD-10-CM | POA: Insufficient documentation

## 2014-04-25 DIAGNOSIS — E7800 Pure hypercholesterolemia, unspecified: Secondary | ICD-10-CM | POA: Insufficient documentation

## 2014-04-25 HISTORY — DX: Other hyperlipidemia: E78.49

## 2014-04-25 NOTE — Assessment & Plan Note (Signed)
Resolved.  Seeing an allergist.

## 2014-04-25 NOTE — Assessment & Plan Note (Signed)
On lisinopril 10mg  q day.  Doing well.  Pressures look good.  Recheck pressure wnl.  Follow metabolic panel.

## 2014-04-25 NOTE — Assessment & Plan Note (Signed)
Low cholesterol diet and exercise.  Follow.   

## 2014-04-25 NOTE — Progress Notes (Signed)
  Subjective:    Patient ID: Kristen Aguilar, female    DOB: 1966/07/14, 48 y.o.   MRN: [id number]  HPI 48 year old female with past history of allergy problems and gestational diabetes.  She was just recently diagnosed with hypertension and started on Lisinopril.  Comes in today for a scheduled follow up.  Had borderline blood pressure and sugar during her pregnancy.  Tries to stay active.  No cardiac symptoms with increased activity or exertion.  Breathing stable.  Bowels stable.   Has had a tubal ligation.   She feels she is handling stress relatively well now.  Overall she feels she is doing well.  Previous rash resolved.  Saw an allergist.  Overall she feels she is doing well.     Past Medical History  Diagnosis Date  . Allergy   . Gestational diabetes   . Anemia     as a child    Outpatient Encounter Prescriptions as of 04/22/2014  Medication Sig  . cetirizine (ZYRTEC) 10 MG tablet Take 10 mg by mouth daily.  . fish oil-omega-3 fatty acids 1000 MG capsule Take 2 g by mouth daily.  . fluticasone (FLONASE) 50 MCG/ACT nasal spray Place 2 sprays into both nostrils daily.  Marland Kitchen. lisinopril (PRINIVIL,ZESTRIL) 10 MG tablet Take 1 tablet (10 mg total) by mouth daily.  . Multiple Vitamin (MULTIVITAMIN WITH MINERALS) TABS Take 1 tablet by mouth daily.  Marland Kitchen. OVER THE COUNTER MEDICATION [employer]- Heart health supplement    Review of Systems Patient denies any headache, lightheadedness or dizziness.  No significant sinus or allergy symptoms currently.  No chest pain, tightness or palpitations.  No increased shortness of breath, cough or congestion.  No nausea or vomiting.  No acid reflux. No abdominal pain or cramping.  No bowel change, such as diarrhea, constipation, BRBPR or melana.  No urine change.  Feels she is handling stress relatively well.       Objective:   Physical Exam  Filed Vitals:   04/25/14 2132  BP: 130/78  Pulse:   Temp:    Blood pressure recheck:  23130/2378  48 year old  female in no acute distress.   HEENT:  Nares- clear.  Oropharynx - without lesions. NECK:  Supple.  Nontender.  No audible bruit.  HEART:  Appears to be regular. LUNGS:  No crackles or wheezing audible.  Respirations even and unlabored.  RADIAL PULSE:  Equal bilaterally.    ABDOMEN:  Soft, nontender.  Bowel sounds present and normal.  No audible abdominal bruit.   EXTREMITIES:  No increased edema present.  DP pulses palpable and equal bilaterally.          Assessment & Plan:  GYN.  Periods as outlined.  Pap 07/07/13 - negative with negative HPV.    INCREASED PSYCHOSOCIAL STRESSORS.  Feels she is handling things relatively well.  Follow.     HEALTH MAINTENANCE.  Physical 07/07/13.   Mammogram 03/11/13 - Birads I.  Schedule f/u mammogram.  PAP 07/07/13 negative with negative HPV.

## 2014-04-25 NOTE — Assessment & Plan Note (Signed)
Noted as a child.  Follow hgb.

## 2014-04-25 NOTE — Assessment & Plan Note (Signed)
Have discussed importance of diet and weight loss.  Follow metabolic panel and a1c.

## 2014-07-20 ENCOUNTER — Other Ambulatory Visit (INDEPENDENT_AMBULATORY_CARE_PROVIDER_SITE_OTHER): Payer: 59

## 2014-07-20 DIAGNOSIS — E7800 Pure hypercholesterolemia, unspecified: Secondary | ICD-10-CM

## 2014-07-20 DIAGNOSIS — I10 Essential (primary) hypertension: Secondary | ICD-10-CM

## 2014-07-20 LAB — LIPID PANEL
CHOLESTEROL: 225 mg/dL — AB (ref 0–200)
HDL: 44.9 mg/dL (ref >39.00)
NonHDL: 180.1
Total CHOL/HDL Ratio: 5
Triglycerides: 259 mg/dL — ABNORMAL HIGH (ref 0.0–149.0)
VLDL: 51.8 mg/dL — AB (ref 0.0–40.0)

## 2014-07-20 LAB — COMPREHENSIVE METABOLIC PANEL
ALT: 23 U/L (ref 0–35)
AST: 23 U/L (ref 0–37)
Albumin: 4.3 g/dL (ref 3.5–5.2)
Alkaline Phosphatase: 77 U/L (ref 39–117)
BILIRUBIN TOTAL: 0.9 mg/dL (ref 0.2–1.2)
BUN: 13 mg/dL (ref 6–23)
CO2: 30 mEq/L (ref 19–32)
Calcium: 9.5 mg/dL (ref 8.4–10.5)
Chloride: 101 mEq/L (ref 96–112)
Creatinine, Ser: 0.9 mg/dL (ref 0.4–1.2)
GFR: 70.57 mL/min (ref >60.00)
Glucose, Bld: 94 mg/dL (ref 70–99)
POTASSIUM: 4.1 meq/L (ref 3.5–5.1)
SODIUM: 137 meq/L (ref 135–145)
TOTAL PROTEIN: 8.1 g/dL (ref 6.0–8.3)

## 2014-07-20 LAB — LDL CHOLESTEROL, DIRECT: LDL DIRECT: 135.3 mg/dL

## 2014-07-21 ENCOUNTER — Encounter: Payer: Self-pay | Admitting: Internal Medicine

## 2014-07-23 ENCOUNTER — Ambulatory Visit (INDEPENDENT_AMBULATORY_CARE_PROVIDER_SITE_OTHER): Payer: 59 | Admitting: Internal Medicine

## 2014-07-23 ENCOUNTER — Encounter: Payer: Self-pay | Admitting: Internal Medicine

## 2014-07-23 VITALS — BP 138/80 | HR 78 | Temp 98.2°F | Ht 65.0 in | Wt 202.5 lb

## 2014-07-23 DIAGNOSIS — Z8632 Personal history of gestational diabetes: Secondary | ICD-10-CM

## 2014-07-23 DIAGNOSIS — I10 Essential (primary) hypertension: Secondary | ICD-10-CM

## 2014-07-23 DIAGNOSIS — E7800 Pure hypercholesterolemia, unspecified: Secondary | ICD-10-CM

## 2014-07-23 DIAGNOSIS — D649 Anemia, unspecified: Secondary | ICD-10-CM

## 2014-07-23 DIAGNOSIS — N939 Abnormal uterine and vaginal bleeding, unspecified: Secondary | ICD-10-CM

## 2014-07-23 DIAGNOSIS — R946 Abnormal results of thyroid function studies: Secondary | ICD-10-CM

## 2014-07-23 DIAGNOSIS — R7989 Other specified abnormal findings of blood chemistry: Secondary | ICD-10-CM

## 2014-07-23 DIAGNOSIS — N898 Other specified noninflammatory disorders of vagina: Secondary | ICD-10-CM

## 2014-07-23 DIAGNOSIS — E669 Obesity, unspecified: Secondary | ICD-10-CM

## 2014-07-23 NOTE — Progress Notes (Signed)
Pre visit review using our clinic review tool, if applicable. No additional management support is needed unless otherwise documented below in the visit note. 

## 2014-07-25 ENCOUNTER — Encounter: Payer: Self-pay | Admitting: Internal Medicine

## 2014-07-25 DIAGNOSIS — Z6837 Body mass index (BMI) 37.0-37.9, adult: Secondary | ICD-10-CM | POA: Insufficient documentation

## 2014-07-25 DIAGNOSIS — N939 Abnormal uterine and vaginal bleeding, unspecified: Secondary | ICD-10-CM | POA: Insufficient documentation

## 2014-07-25 NOTE — Progress Notes (Signed)
Subjective:    Patient ID: Kristen Aguilar, female    DOB: May 05, 1966, 48 y.o.   MRN: [id number]  HPI 48 year old female with past history of allergy problems and gestational diabetes.  She was just recently diagnosed with hypertension and started on Lisinopril.  Comes in today to follow up on these issues as well as for a complete physical exam.   Had borderline blood pressure and sugar during her pregnancy.  Tries to stay active.  No cardiac symptoms with increased activity or exertion.  Breathing stable.  Bowels stable.   Has had a tubal ligation.   States stopped having regular periods three years ago.  Since that time she states that 2-3x/year, she will notice some spotting for a day or two and then it stops.  She has never gone a full year without the spotting.  States used to be more days.  Now is down to 1-2 days.   No abdominal pain or cramping.  No abdominal fullness.  No vaginal symptoms.  She feels she is handling stress relatively well.  Discussed her weight.  Discussed diet and exercise.  Want to avoid diet pills.     Past Medical History  Diagnosis Date  . Allergy   . Gestational diabetes   . Anemia     as a child    Outpatient Encounter Prescriptions as of 07/23/2014  Medication Sig  . cetirizine (ZYRTEC) 10 MG tablet Take 10 mg by mouth daily.  . fish oil-omega-3 fatty acids 1000 MG capsule Take 2 g by mouth daily.  . fluticasone (FLONASE) 50 MCG/ACT nasal spray Place 2 sprays into both nostrils daily.  Marland Kitchen. lisinopril (PRINIVIL,ZESTRIL) 10 MG tablet Take 1 tablet (10 mg total) by mouth daily.  . Multiple Vitamin (MULTIVITAMIN WITH MINERALS) TABS Take 1 tablet by mouth daily.  Marland Kitchen. OVER THE COUNTER MEDICATION [employer]- Heart health supplement    Review of Systems Patient denies any headache, lightheadedness or dizziness.  No significant sinus or allergy symptoms currently.  No chest pain, tightness or palpitations.  No increased shortness of breath, cough or congestion.  No  nausea or vomiting.  No acid reflux. No abdominal pain or cramping.  No bowel change, such as diarrhea, constipation, BRBPR or melana.  No urine change.  Feels she is handling stress relatively well.  Discussed diet and exercise.  Discussed weight loss.  Spotting as outlined.       Objective:   Physical Exam  Filed Vitals:   07/23/14 0829  BP: 138/80  Pulse: 78  Temp: 98.2 F (36.8 C)   Blood pressure recheck:  31132/1380  48 year old female in no acute distress.   HEENT:  Nares- clear.  Oropharynx - without lesions. NECK:  Supple.  Nontender.  No audible bruit.  HEART:  Appears to be regular. LUNGS:  No crackles or wheezing audible.  Respirations even and unlabored.  RADIAL PULSE:  Equal bilaterally.    BREASTS:  No nipple discharge or nipple retraction present.  Could not appreciate any distinct nodules or axillary adenopathy.  ABDOMEN:  Soft, nontender.  Bowel sounds present and normal.  No audible abdominal bruit.  GU:  Not performed.     EXTREMITIES:  No increased edema present.  DP pulses palpable and equal bilaterally.      FEET:  No lesions.        Assessment & Plan:  GYN.  Pap 07/07/13 - negative with negative HPV.  Spotting as outlined.  Discussed my  desire for further w/up including pelvic ultrasound and/or gyn referral.  She declines.    INCREASED PSYCHOSOCIAL STRESSORS.  Feels she is handling things relatively well.  Follow.   Anemia, unspecified anemia type Noted as a child.  Follow hgb.    Essential hypertension, benign Blood pressure doing well on lisinopril.  Follow.  Follow metabolic panel.    History of gestational diabetes Will follow fasting glucose.    Hypercholesterolemia Last cholesterol panel revealed total cholesterol 225, triglycerides 259, HDL 45 and LDL 135.  Declines cholesterol medication.  Follow.  Low cholesterol diet and exercise.    Vaginal spotting Discussed with her today.  She declines further w/up.  See above.  Follow.    Obesity (BMI  30.0-34.9) Discussed diet, exercise and weight loss.  Plan formed.  Form completed.      HEALTH MAINTENANCE.  Physical today.   Mammogram 03/11/13 - Birads I.  Follow up mammogram ordered.  PAP 07/07/13 negative with negative HPV.

## 2014-08-30 ENCOUNTER — Other Ambulatory Visit: Payer: Self-pay | Admitting: *Deleted

## 2014-08-30 MED ORDER — LISINOPRIL 10 MG PO TABS: 10.0000 mg | ORAL_TABLET | Freq: Every day | ORAL | Status: DC

## 2014-11-23 ENCOUNTER — Other Ambulatory Visit: Payer: 59

## 2014-11-25 ENCOUNTER — Ambulatory Visit: Payer: 59 | Admitting: Internal Medicine

## 2015-01-14 ENCOUNTER — Other Ambulatory Visit (INDEPENDENT_AMBULATORY_CARE_PROVIDER_SITE_OTHER): Payer: 59

## 2015-01-14 DIAGNOSIS — R946 Abnormal results of thyroid function studies: Secondary | ICD-10-CM | POA: Diagnosis not present

## 2015-01-14 DIAGNOSIS — E7800 Pure hypercholesterolemia, unspecified: Secondary | ICD-10-CM

## 2015-01-14 DIAGNOSIS — I10 Essential (primary) hypertension: Secondary | ICD-10-CM | POA: Diagnosis not present

## 2015-01-14 DIAGNOSIS — R7989 Other specified abnormal findings of blood chemistry: Secondary | ICD-10-CM

## 2015-01-14 LAB — LIPID PANEL
Cholesterol: 234 mg/dL — ABNORMAL HIGH (ref 0–200)
HDL: 47.7 mg/dL (ref >39.00)
LDL CALC: 148 mg/dL — AB (ref 0–99)
NONHDL: 186.3
Total CHOL/HDL Ratio: 5
Triglycerides: 190 mg/dL — ABNORMAL HIGH (ref 0.0–149.0)
VLDL: 38 mg/dL (ref 0.0–40.0)

## 2015-01-14 LAB — COMPREHENSIVE METABOLIC PANEL
ALK PHOS: 84 U/L (ref 39–117)
ALT: 19 U/L (ref 0–35)
AST: 19 U/L (ref 0–37)
Albumin: 4.2 g/dL (ref 3.5–5.2)
BUN: 14 mg/dL (ref 6–23)
CO2: 29 mEq/L (ref 19–32)
Calcium: 9.6 mg/dL (ref 8.4–10.5)
Chloride: 102 mEq/L (ref 96–112)
Creatinine, Ser: 0.88 mg/dL (ref 0.40–1.20)
GFR: 72.28 mL/min (ref >60.00)
GLUCOSE: 101 mg/dL — AB (ref 70–99)
Potassium: 4.1 mEq/L (ref 3.5–5.1)
SODIUM: 137 meq/L (ref 135–145)
TOTAL PROTEIN: 7.3 g/dL (ref 6.0–8.3)
Total Bilirubin: 0.6 mg/dL (ref 0.2–1.2)

## 2015-01-14 LAB — TSH: TSH: 5.48 u[IU]/mL — ABNORMAL HIGH (ref 0.35–4.50)

## 2015-01-15 ENCOUNTER — Encounter: Payer: Self-pay | Admitting: Internal Medicine

## 2015-01-17 ENCOUNTER — Encounter: Payer: Self-pay | Admitting: Internal Medicine

## 2015-01-17 ENCOUNTER — Ambulatory Visit (INDEPENDENT_AMBULATORY_CARE_PROVIDER_SITE_OTHER): Payer: 59 | Admitting: Internal Medicine

## 2015-01-17 VITALS — BP 130/80 | HR 73 | Temp 98.2°F | Ht 65.0 in | Wt 203.4 lb

## 2015-01-17 DIAGNOSIS — E669 Obesity, unspecified: Secondary | ICD-10-CM

## 2015-01-17 DIAGNOSIS — R946 Abnormal results of thyroid function studies: Secondary | ICD-10-CM | POA: Diagnosis not present

## 2015-01-17 DIAGNOSIS — Z0000 Encounter for general adult medical examination without abnormal findings: Secondary | ICD-10-CM

## 2015-01-17 DIAGNOSIS — I10 Essential (primary) hypertension: Secondary | ICD-10-CM | POA: Diagnosis not present

## 2015-01-17 DIAGNOSIS — N898 Other specified noninflammatory disorders of vagina: Secondary | ICD-10-CM

## 2015-01-17 DIAGNOSIS — N939 Abnormal uterine and vaginal bleeding, unspecified: Secondary | ICD-10-CM

## 2015-01-17 DIAGNOSIS — E7800 Pure hypercholesterolemia, unspecified: Secondary | ICD-10-CM

## 2015-01-17 DIAGNOSIS — R7989 Other specified abnormal findings of blood chemistry: Secondary | ICD-10-CM

## 2015-01-17 MED ORDER — LISINOPRIL 10 MG PO TABS: 10.0000 mg | ORAL_TABLET | Freq: Every day | ORAL | Status: DC

## 2015-01-17 MED ORDER — PRAVASTATIN SODIUM 10 MG PO TABS: 10.0000 mg | ORAL_TABLET | Freq: Every day | ORAL | Status: DC

## 2015-01-17 NOTE — Telephone Encounter (Signed)
Unread mychart message mailed to patient 

## 2015-01-17 NOTE — Progress Notes (Signed)
Patient ID: Kristen Aguilar, female   DOB: 08-23-1966, 49 y.o.   MRN: [id number]   Subjective:    Patient ID: Kristen Aguilar, female    DOB: [birth year], 49 y.o.   MRN: [id number]  HPI  Patient here for a scheduled follow up.  She states she is doing relatively well.  Has been exercising - 20 minutes 6 days/week.  No cardiac symptoms with increased activity or exertion.  Breathing stable.  No increased cough or congestion.  Bowels stable.  Discussed her cholesterol.  Still elevated above goal.  Discussed starting cholesterol medication.  She is in agreement.     Past Medical History  Diagnosis Date  . Allergy   . Gestational diabetes   . Anemia     as a child    Current Outpatient Prescriptions on File Prior to Visit  Medication Sig Dispense Refill  . cetirizine (ZYRTEC) 10 MG tablet Take 10 mg by mouth daily.    . fish oil-omega-3 fatty acids 1000 MG capsule Take 2 g by mouth daily.    . fluticasone (FLONASE) 50 MCG/ACT nasal spray Place 2 sprays into both nostrils daily.    . Multiple Vitamin (MULTIVITAMIN WITH MINERALS) TABS Take 1 tablet by mouth daily.    Marland Kitchen OVER THE COUNTER MEDICATION [employer]- Heart health supplement     No current facility-administered medications on file prior to visit.    Review of Systems  Constitutional: Negative for appetite change and unexpected weight change.  HENT: Negative for congestion and sinus pressure.   Respiratory: Negative for cough, chest tightness and shortness of breath.   Cardiovascular: Negative for chest pain, palpitations and leg swelling.  Gastrointestinal: Negative for nausea, vomiting, abdominal pain and diarrhea.  Neurological: Negative for dizziness, light-headedness and headaches.       Objective:     Blood pressure recheck:  134/78-80  Physical Exam  HENT:  Nose: Nose normal.  Mouth/Throat: Oropharynx is clear and moist.  Neck: Neck supple. No thyromegaly present.  Cardiovascular: Normal rate and regular  rhythm.   Pulmonary/Chest: Breath sounds normal. No respiratory distress. She has no wheezes.  Abdominal: Soft. Bowel sounds are normal. There is no tenderness.  Musculoskeletal: She exhibits no edema or tenderness.  Lymphadenopathy:    She has no cervical adenopathy.  Psychiatric: She has a normal mood and affect. Her behavior is normal.    BP 130/80 mmHg  Pulse 73  Temp(Src) 98.2 F (36.8 C) (Oral)  Ht  (1.651 m)  Wt 203 lb 6 oz (92.25 kg)  BMI 33.84 kg/m2  SpO2 97% Wt Readings from Last 3 Encounters:  01/17/15 203 lb 6 oz (92.25 kg)  07/23/14 202 lb 8 oz (91.853 kg)  04/22/14 198 lb (89.812 kg)     Lab Results  Component Value Date   WBC 8.6 04/20/2014   HGB 14.1 04/20/2014   HCT 42.8 04/20/2014   PLT 315.0 04/20/2014   GLUCOSE 101* 01/14/2015   CHOL 234* 01/14/2015   TRIG 190.0* 01/14/2015   HDL 47.70 01/14/2015   LDLDIRECT 135.3 07/20/2014   LDLCALC 148* 01/14/2015   ALT 19 01/14/2015   AST 19 01/14/2015   NA 137 01/14/2015   K 4.1 01/14/2015   CL 102 01/14/2015   CREATININE 0.88 01/14/2015   BUN 14 01/14/2015   CO2 29 01/14/2015   TSH 5.48* 01/14/2015       Assessment & Plan:   Problem List Items Addressed This Visit    Essential hypertension, benign  Blood pressure doing well.  Same medication regimen.  Follow pressures.  Follow metabolic panel.       Relevant Medications   lisinopril (PRINIVIL,ZESTRIL) tablet   pravastatin (PRAVACHOL) tablet   Health care maintenance    Physical 07/23/14.  PAP 07/07/13 negative with negative HPV.        Hypercholesterolemia    Low cholesterol diet and exercise.  Start pravastatin 10mg  q day.  Check liver panel in 6 weeks.        Relevant Medications   lisinopril (PRINIVIL,ZESTRIL) tablet   pravastatin (PRAVACHOL) tablet   Other Relevant Orders   Hepatic function panel   Obesity (BMI 30.0-34.9)    Discussed diet and exercise.  Follow.       Vaginal spotting    States she has had no spotting or  bleeding.  Previously declined gyn evaluation.         Other Visit Diagnoses    Elevated TSH    -  Primary    Relevant Orders    TSH      I spent 25 minutes with the patient and more than 50% of the time was spent in consultation regarding the above.     Dale DurhamSCOTT, Pate Aylward, MD

## 2015-01-17 NOTE — Progress Notes (Signed)
Pre visit review using our clinic review tool, if applicable. No additional management support is needed unless otherwise documented below in the visit note. 

## 2015-01-22 ENCOUNTER — Encounter: Payer: Self-pay | Admitting: Internal Medicine

## 2015-01-22 DIAGNOSIS — Z0000 Encounter for general adult medical examination without abnormal findings: Secondary | ICD-10-CM | POA: Insufficient documentation

## 2015-01-22 NOTE — Assessment & Plan Note (Signed)
Discussed diet and exercise.  Follow.  

## 2015-01-22 NOTE — Assessment & Plan Note (Signed)
States she has had no spotting or bleeding.  Previously declined gyn evaluation.

## 2015-01-22 NOTE — Assessment & Plan Note (Signed)
Physical 07/23/14.  PAP 07/07/13 negative with negative HPV.

## 2015-01-22 NOTE — Assessment & Plan Note (Signed)
Blood pressure doing well.  Same medication regimen.  Follow pressures.  Follow metabolic panel.   

## 2015-01-22 NOTE — Assessment & Plan Note (Signed)
Low cholesterol diet and exercise.  Start pravastatin 10mg  q day.  Check liver panel in 6 weeks.

## 2015-03-02 ENCOUNTER — Other Ambulatory Visit (INDEPENDENT_AMBULATORY_CARE_PROVIDER_SITE_OTHER): Payer: 59

## 2015-03-02 ENCOUNTER — Encounter: Payer: Self-pay | Admitting: Internal Medicine

## 2015-03-02 DIAGNOSIS — E7800 Pure hypercholesterolemia, unspecified: Secondary | ICD-10-CM

## 2015-03-02 DIAGNOSIS — R7989 Other specified abnormal findings of blood chemistry: Secondary | ICD-10-CM

## 2015-03-02 DIAGNOSIS — R946 Abnormal results of thyroid function studies: Secondary | ICD-10-CM

## 2015-03-02 LAB — HEPATIC FUNCTION PANEL
ALK PHOS: 82 U/L (ref 39–117)
ALT: 16 U/L (ref 0–35)
AST: 17 U/L (ref 0–37)
Albumin: 4.3 g/dL (ref 3.5–5.2)
BILIRUBIN DIRECT: 0.1 mg/dL (ref 0.0–0.3)
Total Bilirubin: 0.4 mg/dL (ref 0.2–1.2)
Total Protein: 7.5 g/dL (ref 6.0–8.3)

## 2015-03-02 LAB — TSH: TSH: 5.23 u[IU]/mL — ABNORMAL HIGH (ref 0.35–4.50)

## 2015-03-02 NOTE — Telephone Encounter (Signed)
Pt was notified of labs via my chart.  Needs a f/u non fasting lab appt in 4-6 weeks.  Please notify the patient of the appointment date and time.  Thanks.

## 2015-03-03 NOTE — Telephone Encounter (Signed)
Unread mychart message & lab appt mailed to patient 

## 2015-04-14 ENCOUNTER — Encounter: Payer: Self-pay | Admitting: Internal Medicine

## 2015-04-14 ENCOUNTER — Other Ambulatory Visit (INDEPENDENT_AMBULATORY_CARE_PROVIDER_SITE_OTHER): Payer: 59

## 2015-04-14 DIAGNOSIS — R7989 Other specified abnormal findings of blood chemistry: Secondary | ICD-10-CM

## 2015-04-14 DIAGNOSIS — R946 Abnormal results of thyroid function studies: Secondary | ICD-10-CM | POA: Diagnosis not present

## 2015-04-14 LAB — TSH: TSH: 4.13 u[IU]/mL (ref 0.35–4.50)

## 2015-04-26 ENCOUNTER — Encounter: Payer: Self-pay | Admitting: Internal Medicine

## 2015-04-27 ENCOUNTER — Other Ambulatory Visit: Payer: Self-pay | Admitting: *Deleted

## 2015-04-27 MED ORDER — PRAVASTATIN SODIUM 10 MG PO TABS: 10.0000 mg | ORAL_TABLET | Freq: Every day | ORAL | Status: DC

## 2015-05-24 ENCOUNTER — Ambulatory Visit: Payer: 59 | Admitting: Internal Medicine

## 2015-05-24 LAB — LIPID PANEL
CHOLESTEROL: 187 mg/dL (ref 0–200)
HDL: 49 mg/dL (ref 35–70)
LDL Cholesterol: 102 mg/dL
Triglycerides: 178 mg/dL — AB (ref 40–160)

## 2015-05-24 LAB — BASIC METABOLIC PANEL: Glucose: 93 mg/dL

## 2015-05-24 LAB — HEMOGLOBIN A1C: Hgb A1c MFr Bld: 6.2 % — AB (ref 4.0–6.0)

## 2015-05-26 ENCOUNTER — Encounter: Payer: Self-pay | Admitting: Internal Medicine

## 2015-05-26 ENCOUNTER — Ambulatory Visit (INDEPENDENT_AMBULATORY_CARE_PROVIDER_SITE_OTHER): Payer: 59 | Admitting: Internal Medicine

## 2015-05-26 VITALS — BP 126/74 | HR 74 | Temp 98.2°F | Ht 65.0 in | Wt 201.6 lb

## 2015-05-26 DIAGNOSIS — E7800 Pure hypercholesterolemia, unspecified: Secondary | ICD-10-CM

## 2015-05-26 DIAGNOSIS — D649 Anemia, unspecified: Secondary | ICD-10-CM

## 2015-05-26 DIAGNOSIS — Z1239 Encounter for other screening for malignant neoplasm of breast: Secondary | ICD-10-CM

## 2015-05-26 DIAGNOSIS — I10 Essential (primary) hypertension: Secondary | ICD-10-CM | POA: Diagnosis not present

## 2015-05-26 DIAGNOSIS — E669 Obesity, unspecified: Secondary | ICD-10-CM

## 2015-05-26 MED ORDER — FLUTICASONE PROPIONATE 50 MCG/ACT NA SUSP: 2.0000 | Freq: Every day | NASAL | Status: DC

## 2015-05-26 NOTE — Progress Notes (Signed)
Pre visit review using our clinic review tool, if applicable. No additional management support is needed unless otherwise documented below in the visit note. 

## 2015-05-26 NOTE — Progress Notes (Signed)
Patient ID: Kristen Aguilar, female   DOB: 01-Mar-1966, 49 y.o.   MRN: [id number]   Subjective:    Patient ID: Kristen Aguilar, female    DOB: 06/06/1966, 49 y.o.   MRN: [id number]  HPI  Patient here for a scheduled follow up.  Staying active.  Is exercising.  No cardiac symptoms with increased activity or exertion.  No sob.  Occasional diarrhea, but nothing persistent.  Just an occasional flare.  No abdominal pain or cramping.  Stress controlled.     Past Medical History  Diagnosis Date  . Allergy   . Gestational diabetes   . Anemia     as a child    Family history and social history reviewed.     Outpatient Encounter Prescriptions as of 05/26/2015  Medication Sig  . cetirizine (ZYRTEC) 10 MG tablet Take 10 mg by mouth daily.  . fish oil-omega-3 fatty acids 1000 MG capsule Take 2 g by mouth daily.  . fluticasone (FLONASE) 50 MCG/ACT nasal spray Place 2 sprays into both nostrils daily.  Marland Kitchen lisinopril (PRINIVIL,ZESTRIL) 10 MG tablet Take 1 tablet (10 mg total) by mouth daily.  . Multiple Vitamin (MULTIVITAMIN WITH MINERALS) TABS Take 1 tablet by mouth daily.  Marland Kitchen OVER THE COUNTER MEDICATION [employer]- Heart health supplement  . pravastatin (PRAVACHOL) 10 MG tablet Take 1 tablet (10 mg total) by mouth daily.  . [DISCONTINUED] fluticasone (FLONASE) 50 MCG/ACT nasal spray Place 2 sprays into both nostrils daily.   No facility-administered encounter medications on file as of 05/26/2015.    Review of Systems  Constitutional: Negative for appetite change and unexpected weight change.  HENT: Negative for congestion and sinus pressure.   Respiratory: Negative for cough, chest tightness and shortness of breath.   Cardiovascular: Negative for chest pain, palpitations and leg swelling.  Gastrointestinal: Negative for nausea, vomiting and abdominal pain.       Occasional diarrhea.  Only occasional flare.    Genitourinary: Negative for dysuria and difficulty urinating.  Skin: Negative for  color change and rash.  Neurological: Negative for dizziness, light-headedness and headaches.  Psychiatric/Behavioral: Negative for dysphoric mood and agitation.       Objective:    Physical Exam  Constitutional: She appears well-developed and well-nourished. No distress.  HENT:  Nose: Nose normal.  Mouth/Throat: Oropharynx is clear and moist.  Neck: Neck supple. No thyromegaly present.  Cardiovascular: Normal rate and regular rhythm.   Pulmonary/Chest: Breath sounds normal. No respiratory distress. She has no wheezes.  Abdominal: Soft. Bowel sounds are normal. There is no tenderness.  Musculoskeletal: She exhibits no edema or tenderness.  Lymphadenopathy:    She has no cervical adenopathy.  Skin: No rash noted. No erythema.  Psychiatric: She has a normal mood and affect. Her behavior is normal.    BP 126/74 mmHg  Pulse 74  Temp(Src) 98.2 F (36.8 C) (Oral)  Ht 5\' 5"  (1.651 m)  Wt 201 lb 9.6 oz (91.445 kg)  BMI 33.55 kg/m2  SpO2 97% Wt Readings from Last 3 Encounters:  05/26/15 201 lb 9.6 oz (91.445 kg)  01/17/15 203 lb 6 oz (92.25 kg)  07/23/14 202 lb 8 oz (91.853 kg)     Lab Results  Component Value Date   WBC 8.6 04/20/2014   HGB 14.1 04/20/2014   HCT 42.8 04/20/2014   PLT 315.0 04/20/2014   GLUCOSE 101* 01/14/2015   CHOL 234* 01/14/2015   TRIG 190.0* 01/14/2015   HDL 47.70 01/14/2015   LDLDIRECT 135.3 07/20/2014  LDLCALC 148* 01/14/2015   ALT 16 03/02/2015   AST 17 03/02/2015   NA 137 01/14/2015   K 4.1 01/14/2015   CL 102 01/14/2015   CREATININE 0.88 01/14/2015   BUN 14 01/14/2015   CO2 29 01/14/2015   TSH 4.13 04/14/2015       Assessment & Plan:   Problem List Items Addressed This Visit    Anemia    Previous history of anemia.  Follow cbc.        Essential hypertension, benign    Blood pressure under good control.  Continue same medication regimen.  Follow pressures.  Follow metabolic panel.        Hypercholesterolemia    On  pravastatin.  Low cholesterol diet and exercise.  Follow lipid panel and liver function tests.        Obesity (BMI 30.0-34.9)    Diet and exercise.  Follow.        Other Visit Diagnoses    Screening breast examination    -  Primary    Relevant Orders    MM DIGITAL SCREENING BILATERAL        Dale [place], MD

## 2015-05-28 ENCOUNTER — Encounter: Payer: Self-pay | Admitting: Internal Medicine

## 2015-05-28 NOTE — Assessment & Plan Note (Signed)
Blood pressure under good control.  Continue same medication regimen.  Follow pressures.  Follow metabolic panel.   

## 2015-05-28 NOTE — Assessment & Plan Note (Signed)
Previous history of anemia.  Follow cbc.

## 2015-05-28 NOTE — Assessment & Plan Note (Signed)
Diet and exercise.  Follow.  

## 2015-05-28 NOTE — Assessment & Plan Note (Signed)
On pravastatin.  Low cholesterol diet and exercise.  Follow lipid panel and liver function tests.   

## 2015-06-02 LAB — HM MAMMOGRAPHY: HM Mammogram: NEGATIVE

## 2015-06-08 ENCOUNTER — Encounter: Payer: Self-pay | Admitting: Internal Medicine

## 2015-07-13 ENCOUNTER — Telehealth: Payer: Self-pay | Admitting: Internal Medicine

## 2015-07-13 NOTE — Telephone Encounter (Signed)
Patient dropped off envelope with labs from [facility] . The envelope has been placed in the physician's box.

## 2015-07-13 NOTE — Telephone Encounter (Signed)
Form completed.  In your basket.  Hold labs for f/u appt in 08/2015.

## 2015-07-13 NOTE — Telephone Encounter (Signed)
Placed in red folder  

## 2015-07-13 NOTE — Telephone Encounter (Signed)
Pt notified that form has been completed & placed up front for pickup. 

## 2015-07-25 ENCOUNTER — Encounter: Payer: Self-pay | Admitting: Internal Medicine

## 2015-07-25 ENCOUNTER — Other Ambulatory Visit: Payer: Self-pay

## 2015-07-25 MED ORDER — LISINOPRIL 10 MG PO TABS: 10.0000 mg | ORAL_TABLET | Freq: Every day | ORAL | Status: DC

## 2015-08-26 ENCOUNTER — Encounter: Payer: 59 | Admitting: Internal Medicine

## 2015-09-06 ENCOUNTER — Encounter: Payer: Self-pay | Admitting: Internal Medicine

## 2015-09-06 ENCOUNTER — Ambulatory Visit (INDEPENDENT_AMBULATORY_CARE_PROVIDER_SITE_OTHER): Payer: 59 | Admitting: Internal Medicine

## 2015-09-06 VITALS — BP 138/80 | HR 80 | Temp 98.3°F | Resp 18 | Ht 64.0 in | Wt 204.0 lb

## 2015-09-06 DIAGNOSIS — Z0000 Encounter for general adult medical examination without abnormal findings: Secondary | ICD-10-CM

## 2015-09-06 DIAGNOSIS — E7800 Pure hypercholesterolemia, unspecified: Secondary | ICD-10-CM

## 2015-09-06 DIAGNOSIS — D649 Anemia, unspecified: Secondary | ICD-10-CM

## 2015-09-06 DIAGNOSIS — E669 Obesity, unspecified: Secondary | ICD-10-CM

## 2015-09-06 DIAGNOSIS — Z8632 Personal history of gestational diabetes: Secondary | ICD-10-CM

## 2015-09-06 DIAGNOSIS — I10 Essential (primary) hypertension: Secondary | ICD-10-CM

## 2015-09-06 DIAGNOSIS — R7989 Other specified abnormal findings of blood chemistry: Secondary | ICD-10-CM

## 2015-09-06 NOTE — Progress Notes (Signed)
Patient ID: Kristen Aguilar, female   DOB: [birth year], 49 y.o.   MRN: [id number]   Subjective:    Patient ID: Kristen Aguilar, female    DOB: [birth year], 49 y.o.   MRN: [id number]  HPI  Patient with past history of anemia and gestational diabetes and hypercholesterolemia.  She comes in today to follow up on these issues as well as for a complete physical exam.  We discussed diet and exercise.  She states she is walking - 20 minutes when she walks.  Tries to do this several times per week.  No cardiac symptoms with increased activity or exertion.  Breathing stable.  No acid reflux reported.  No abdominal pain or cramping.  Bowels stable.  Discussed diet.  Not watching her diet as well.  Handling stress.  Does not feel needs any further intervention.     Past Medical History  Diagnosis Date  . Allergy   . Gestational diabetes   . Anemia     as a child   Past Surgical History  Procedure Laterality Date  . Cesarean section  2001 & 2004   Family History  Problem Relation Age of Onset  . Hypertension Mother   . Hypertension Father   . Hypertension Maternal Grandmother   . Diabetes Maternal Grandmother   . Breast cancer Paternal Grandmother    Social History   Social History  . Marital Status: Married    Spouse Name: N/A  . Number of Children: 2  . Years of Education: N/A   Social History Main Topics  . Smoking status: Never Smoker   . Smokeless tobacco: Never Used  . Alcohol Use: No  . Drug Use: No  . Sexual Activity: Not Asked   Other Topics Concern  . None   Social History Narrative    Outpatient Encounter Prescriptions as of 09/06/2015  Medication Sig  . cetirizine (ZYRTEC) 10 MG tablet Take 10 mg by mouth daily.  . Cholecalciferol (VITAMIN D-3 PO) Take by mouth.  . Coenzyme Q10 (CO Q 10) 100 MG CAPS Take by mouth.  . fish oil-omega-3 fatty acids 1000 MG capsule Take 2 g by mouth daily.  . fluticasone (FLONASE) 50 MCG/ACT nasal spray Place 2 sprays into both  nostrils daily.  Marland Kitchen lisinopril (PRINIVIL,ZESTRIL) 10 MG tablet Take 1 tablet (10 mg total) by mouth daily.  . Multiple Vitamin (MULTIVITAMIN WITH MINERALS) TABS Take 1 tablet by mouth daily.  . pravastatin (PRAVACHOL) 10 MG tablet Take 1 tablet (10 mg total) by mouth daily.  . [place] [employer]- Heart health supplement   No facility-administered encounter medications on file as of 09/06/2015.    Review of Systems  Constitutional: Negative for appetite change and unexpected weight change.  HENT: Negative for congestion and sinus pressure.   Eyes: Negative for pain and visual disturbance.  Respiratory: Negative for cough, chest tightness and shortness of breath.   Cardiovascular: Negative for chest pain, palpitations and leg swelling.  Gastrointestinal: Negative for nausea, vomiting, abdominal pain and diarrhea.  Genitourinary: Negative for dysuria and difficulty urinating.  Musculoskeletal: Negative for back pain and joint swelling.  Skin: Negative for color change and rash.  Neurological: Negative for dizziness, light-headedness and headaches.  Hematological: Negative for adenopathy. Does not bruise/bleed easily.  Psychiatric/Behavioral: Negative for dysphoric mood and agitation.       Objective:    Physical Exam  Constitutional: She is oriented to person, place, and time. She appears well-developed and well-nourished. No  distress.  HENT:  Nose: Nose normal.  Mouth/Throat: Oropharynx is clear and moist.  Eyes: Right eye exhibits no discharge. Left eye exhibits no discharge. No scleral icterus.  Neck: Neck supple. No thyromegaly present.  Cardiovascular: Normal rate and regular rhythm.   Pulmonary/Chest: Breath sounds normal. No accessory muscle usage. No tachypnea. No respiratory distress. She has no decreased breath sounds. She has no wheezes. She has no rhonchi. Right breast exhibits no inverted nipple, no mass, no nipple discharge and no  tenderness (no axillary adenopathy). Left breast exhibits no inverted nipple, no mass, no nipple discharge and no tenderness (no axilarry adenopathy).  Abdominal: Soft. Bowel sounds are normal. There is no tenderness.  Musculoskeletal: She exhibits no edema or tenderness.  Lymphadenopathy:    She has no cervical adenopathy.  Neurological: She is alert and oriented to person, place, and time.  Skin: Skin is warm. No rash noted. No erythema.  Psychiatric: She has a normal mood and affect. Her behavior is normal.    BP 138/80 mmHg  Pulse 80  Temp(Src) 98.3 F (36.8 C) (Oral)  Resp 18  Ht _0  (1.626 m)  Wt 204 lb (92.534 kg)  BMI 35.00 kg/m2  SpO2 97% Wt Readings from Last 3 Encounters:  09/06/15 204 lb (92.534 kg)  05/26/15 201 lb 9.6 oz (91.445 kg)  01/17/15 203 lb 6 oz (92.25 kg)     Lab Results  Component Value Date   WBC 8.6 04/20/2014   HGB 14.1 04/20/2014   HCT 42.8 04/20/2014   PLT 315.0 04/20/2014   GLUCOSE 101* 01/14/2015   CHOL 187 05/24/2015   TRIG 178* 05/24/2015   HDL 49 05/24/2015   LDLDIRECT 135.3 07/20/2014   LDLCALC 102 05/24/2015   ALT 16 03/02/2015   AST 17 03/02/2015   NA 137 01/14/2015   K 4.1 01/14/2015   CL 102 01/14/2015   CREATININE 0.88 01/14/2015   BUN 14 01/14/2015   CO2 29 01/14/2015   TSH 4.13 04/14/2015   HGBA1C 6.2* 05/24/2015       Assessment & Plan:   Problem List Items Addressed This Visit    Anemia    Has a history of anemia.  Follow cbc.        Relevant Orders   CBC with Differential/Platelet   Essential hypertension, benign    Blood pressure under good control.  Continue same medication regimen.  Follow pressures.  Follow metabolic panel.        Relevant Orders   CBC with Differential/Platelet   Basic metabolic panel   Health care maintenance    Physical today 09/06/15.  PAP 07/07/13 - negative with negative HPV.  Mammogram 06/02/15 - birads I.  Discussed the need for GI referral for colonoscopy.  Will notify me  when agreeable.        History of gestational diabetes    Discussed diet and weight loss.  Follow met b and a1c.        Relevant Orders   Hemoglobin A1c   Hypercholesterolemia    Low cholesterol diet and exercise.  Follow lipid panel and liver function tests.  On pravastatin.        Relevant Orders   Lipid panel   Hepatic function panel   Obesity (BMI 30.0-34.9)    Diet and exercise.  Follow.         Other Visit Diagnoses    Routine general medical examination at a health care facility    -  Primary  Elevated TSH        Relevant Orders    TSH        Einar Pheasant, MD

## 2015-09-06 NOTE — Progress Notes (Signed)
Pre-visit discussion using our clinic review tool. No additional management support is needed unless otherwise documented below in the visit note.  

## 2015-09-10 ENCOUNTER — Encounter: Payer: Self-pay | Admitting: Internal Medicine

## 2015-09-10 NOTE — Assessment & Plan Note (Signed)
Has a history of anemia.  Follow cbc.  

## 2015-09-10 NOTE — Assessment & Plan Note (Signed)
Low cholesterol diet and exercise.  Follow lipid panel and liver function tests.  On pravastatin.   

## 2015-09-10 NOTE — Assessment & Plan Note (Signed)
Blood pressure under good control.  Continue same medication regimen.  Follow pressures.  Follow metabolic panel.   

## 2015-09-10 NOTE — Assessment & Plan Note (Signed)
Diet and exercise.  Follow.  

## 2015-09-10 NOTE — Assessment & Plan Note (Signed)
Physical today 09/06/15.  PAP 07/07/13 - negative with negative HPV.  Mammogram 06/02/15 - birads I.  Discussed the need for GI referral for colonoscopy.  Will notify me when agreeable.

## 2015-09-10 NOTE — Assessment & Plan Note (Signed)
Discussed diet and weight loss.  Follow met b and a1c.

## 2015-10-28 ENCOUNTER — Encounter: Payer: Self-pay | Admitting: Internal Medicine

## 2015-11-02 ENCOUNTER — Other Ambulatory Visit: Payer: Self-pay | Admitting: *Deleted

## 2015-11-02 ENCOUNTER — Encounter: Payer: Self-pay | Admitting: Internal Medicine

## 2015-11-02 MED ORDER — PRAVASTATIN SODIUM 10 MG PO TABS: 10.0000 mg | ORAL_TABLET | Freq: Every day | ORAL | Status: DC

## 2015-12-08 ENCOUNTER — Other Ambulatory Visit (INDEPENDENT_AMBULATORY_CARE_PROVIDER_SITE_OTHER): Payer: 59

## 2015-12-08 DIAGNOSIS — R946 Abnormal results of thyroid function studies: Secondary | ICD-10-CM | POA: Diagnosis not present

## 2015-12-08 DIAGNOSIS — I10 Essential (primary) hypertension: Secondary | ICD-10-CM

## 2015-12-08 DIAGNOSIS — E7800 Pure hypercholesterolemia, unspecified: Secondary | ICD-10-CM | POA: Diagnosis not present

## 2015-12-08 DIAGNOSIS — D649 Anemia, unspecified: Secondary | ICD-10-CM | POA: Diagnosis not present

## 2015-12-08 DIAGNOSIS — Z8632 Personal history of gestational diabetes: Secondary | ICD-10-CM | POA: Diagnosis not present

## 2015-12-08 DIAGNOSIS — R7989 Other specified abnormal findings of blood chemistry: Secondary | ICD-10-CM

## 2015-12-08 LAB — CBC WITH DIFFERENTIAL/PLATELET
BASOS PCT: 0.3 % (ref 0.0–3.0)
Basophils Absolute: 0 10*3/uL (ref 0.0–0.1)
EOS ABS: 0.2 10*3/uL (ref 0.0–0.7)
Eosinophils Relative: 2.7 % (ref 0.0–5.0)
HCT: 41.2 % (ref 36.0–46.0)
HEMOGLOBIN: 14 g/dL (ref 12.0–15.0)
Lymphocytes Relative: 33.6 % (ref 12.0–46.0)
Lymphs Abs: 2.7 10*3/uL (ref 0.7–4.0)
MCHC: 33.9 g/dL (ref 30.0–36.0)
MCV: 86.6 fl (ref 78.0–100.0)
MONO ABS: 0.5 10*3/uL (ref 0.1–1.0)
Monocytes Relative: 5.7 % (ref 3.0–12.0)
NEUTROS PCT: 57.7 % (ref 43.0–77.0)
Neutro Abs: 4.6 10*3/uL (ref 1.4–7.7)
Platelets: 354 10*3/uL (ref 150.0–400.0)
RBC: 4.76 Mil/uL (ref 3.87–5.11)
RDW: 13.1 % (ref 11.5–15.5)
WBC: 7.9 10*3/uL (ref 4.0–10.5)

## 2015-12-08 LAB — LIPID PANEL
CHOL/HDL RATIO: 4
Cholesterol: 225 mg/dL — ABNORMAL HIGH (ref 0–200)
HDL: 53.8 mg/dL (ref >39.00)
LDL CALC: 137 mg/dL — AB (ref 0–99)
NONHDL: 171.45
Triglycerides: 173 mg/dL — ABNORMAL HIGH (ref 0.0–149.0)
VLDL: 34.6 mg/dL (ref 0.0–40.0)

## 2015-12-08 LAB — BASIC METABOLIC PANEL
BUN: 13 mg/dL (ref 6–23)
CHLORIDE: 103 meq/L (ref 96–112)
CO2: 29 meq/L (ref 19–32)
CREATININE: 0.93 mg/dL (ref 0.40–1.20)
Calcium: 9.6 mg/dL (ref 8.4–10.5)
GFR: 67.57 mL/min (ref >60.00)
Glucose, Bld: 109 mg/dL — ABNORMAL HIGH (ref 70–99)
POTASSIUM: 4.3 meq/L (ref 3.5–5.1)
SODIUM: 140 meq/L (ref 135–145)

## 2015-12-08 LAB — HEPATIC FUNCTION PANEL
ALBUMIN: 4.4 g/dL (ref 3.5–5.2)
ALT: 21 U/L (ref 0–35)
AST: 20 U/L (ref 0–37)
Alkaline Phosphatase: 82 U/L (ref 39–117)
BILIRUBIN TOTAL: 0.6 mg/dL (ref 0.2–1.2)
Bilirubin, Direct: 0.1 mg/dL (ref 0.0–0.3)
Total Protein: 7.4 g/dL (ref 6.0–8.3)

## 2015-12-08 LAB — TSH: TSH: 4.57 u[IU]/mL — AB (ref 0.35–4.50)

## 2015-12-08 LAB — HEMOGLOBIN A1C: HEMOGLOBIN A1C: 6.3 % (ref 4.6–6.5)

## 2015-12-09 ENCOUNTER — Other Ambulatory Visit: Payer: Self-pay | Admitting: Internal Medicine

## 2015-12-09 ENCOUNTER — Other Ambulatory Visit: Payer: Self-pay

## 2015-12-09 ENCOUNTER — Telehealth: Payer: Self-pay | Admitting: Internal Medicine

## 2015-12-09 DIAGNOSIS — R7989 Other specified abnormal findings of blood chemistry: Secondary | ICD-10-CM

## 2015-12-09 MED ORDER — PRAVASTATIN SODIUM 20 MG PO TABS: 20.0000 mg | ORAL_TABLET | Freq: Every day | ORAL | Status: DC

## 2015-12-09 NOTE — Telephone Encounter (Signed)
Spoke with the patient, see result note for details.  

## 2015-12-09 NOTE — Progress Notes (Signed)
Order placed for f/u tsh.  

## 2015-12-09 NOTE — Telephone Encounter (Signed)
Pt is returning office phone to get your lab results. Please call her at home, [phone].

## 2016-01-04 ENCOUNTER — Encounter: Payer: Self-pay | Admitting: Internal Medicine

## 2016-01-04 ENCOUNTER — Other Ambulatory Visit (INDEPENDENT_AMBULATORY_CARE_PROVIDER_SITE_OTHER): Payer: 59

## 2016-01-04 DIAGNOSIS — R946 Abnormal results of thyroid function studies: Secondary | ICD-10-CM | POA: Diagnosis not present

## 2016-01-04 DIAGNOSIS — R7989 Other specified abnormal findings of blood chemistry: Secondary | ICD-10-CM

## 2016-01-04 LAB — TSH: TSH: 5.55 u[IU]/mL — ABNORMAL HIGH (ref 0.35–4.50)

## 2016-01-18 ENCOUNTER — Other Ambulatory Visit: Payer: Self-pay | Admitting: Internal Medicine

## 2016-03-05 ENCOUNTER — Ambulatory Visit: Payer: 59 | Admitting: Internal Medicine

## 2016-03-06 ENCOUNTER — Ambulatory Visit (INDEPENDENT_AMBULATORY_CARE_PROVIDER_SITE_OTHER): Payer: 59 | Admitting: Internal Medicine

## 2016-03-06 ENCOUNTER — Encounter: Payer: Self-pay | Admitting: Internal Medicine

## 2016-03-06 VITALS — BP 138/80 | HR 86 | Temp 98.2°F | Resp 18 | Ht 64.0 in | Wt 214.0 lb

## 2016-03-06 DIAGNOSIS — E7800 Pure hypercholesterolemia, unspecified: Secondary | ICD-10-CM

## 2016-03-06 DIAGNOSIS — Z8632 Personal history of gestational diabetes: Secondary | ICD-10-CM

## 2016-03-06 DIAGNOSIS — T148 Other injury of unspecified body region: Secondary | ICD-10-CM

## 2016-03-06 DIAGNOSIS — W57XXXA Bitten or stung by nonvenomous insect and other nonvenomous arthropods, initial encounter: Secondary | ICD-10-CM | POA: Insufficient documentation

## 2016-03-06 DIAGNOSIS — E669 Obesity, unspecified: Secondary | ICD-10-CM

## 2016-03-06 DIAGNOSIS — D649 Anemia, unspecified: Secondary | ICD-10-CM

## 2016-03-06 DIAGNOSIS — I10 Essential (primary) hypertension: Secondary | ICD-10-CM

## 2016-03-06 NOTE — Assessment & Plan Note (Signed)
Low carb diet.  Discussed decreased bread intake.  Follow met b and a1c.

## 2016-03-06 NOTE — Assessment & Plan Note (Signed)
Last cholesterol check, pravastatin was increased to 20mg  q day.  Follow lipid panel and liver function tests.

## 2016-03-06 NOTE — Progress Notes (Signed)
Pre-visit discussion using our clinic review tool. No additional management support is needed unless otherwise documented below in the visit note.  

## 2016-03-06 NOTE — Assessment & Plan Note (Signed)
Blood pressure doing well.  Same medication regimen.  Follow pressures.  Follow metabolic panel.   

## 2016-03-06 NOTE — Assessment & Plan Note (Signed)
Discussed diet and exercise.  Follow.  

## 2016-03-06 NOTE — Progress Notes (Signed)
Patient ID: Kristen Aguilar, female   DOB: 1966/09/13, 50 y.o.   MRN: [id number]   Subjective:    Patient ID: Kristen Aguilar, female    DOB: 1966-10-03, 50 y.o.   MRN: [id number]  HPI  Patient here for a scheduled follow up.  She is doing well.  Has not been watching her diet as well.  Not exercising.  Just started back on her stationary bike.  No chest pain.  No sob.  No abdominal pain or cramping.  Bowels stable.  No vaginal spotting or discharge.  Handling stress.  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     Past Medical History  Diagnosis Date  . Allergy   . Gestational diabetes   . Anemia     as a child   Past Surgical History  Procedure Laterality Date  . Cesarean section  2001 & 2004   Family History  Problem Relation Age of Onset  . Hypertension Mother   . Hypertension Father   . Hypertension Maternal Grandmother   . Diabetes Maternal Grandmother   . Breast cancer Paternal Grandmother    Social History   Social History  . Marital Status: Married    Spouse Name: N/A  . Number of Children: 2  . Years of Education: N/A   Social History Main Topics  . Smoking status: Never Smoker   . Smokeless tobacco: Never Used  . Alcohol Use: No  . Drug Use: No  . Sexual Activity: Not Asked   Other Topics Concern  . None   Social History Narrative    Outpatient Encounter Prescriptions as of 03/06/2016  Medication Sig  . cetirizine (ZYRTEC) 10 MG tablet Take 10 mg by mouth daily.  . Cholecalciferol (VITAMIN D-3 PO) Take by mouth.  . Coenzyme Q10 (CO Q 10) 100 MG CAPS Take 50 mg by mouth daily.   . fish oil-omega-3 fatty acids 1000 MG capsule Take 2 g by mouth daily.  . fluticasone (FLONASE) 50 MCG/ACT nasal spray Place 2 sprays into both nostrils daily.  Marland Kitchen lisinopril (PRINIVIL,ZESTRIL) 10 MG tablet take 1 tablet by mouth once daily  . Multiple Vitamin (MULTIVITAMIN WITH MINERALS) TABS Take 1 tablet by mouth daily.  . pravastatin (PRAVACHOL) 20 MG  tablet Take 1 tablet (20 mg total) by mouth daily.   No facility-administered encounter medications on file as of 03/06/2016.    Review of Systems  Constitutional: Negative for appetite change.       Has gained weight.  Eating more breads.    HENT: Negative for congestion and sinus pressure.   Respiratory: Negative for cough, chest tightness and shortness of breath.   Cardiovascular: Negative for chest pain, palpitations and leg swelling.  Gastrointestinal: Negative for nausea, vomiting, abdominal pain and diarrhea.  Genitourinary: Negative for dysuria and difficulty urinating.  Musculoskeletal: Negative for back pain and joint swelling.  Skin: Negative for color change and rash.  Neurological: Negative for dizziness, light-headedness and headaches.  Psychiatric/Behavioral: Negative for dysphoric mood and agitation.       Objective:     Blood pressure rechecked by me:  130/72  Physical Exam  Constitutional: She appears well-developed and well-nourished. No distress.  HENT:  Nose: Nose normal.  Mouth/Throat: Oropharynx is clear and moist.  Neck: Neck supple. No thyromegaly present.  Cardiovascular: Normal rate and regular rhythm.   Pulmonary/Chest: Breath sounds normal. No respiratory distress. She has no wheezes.  Abdominal: Soft. Bowel sounds are normal. There is  no tenderness.  Musculoskeletal: She exhibits no edema or tenderness.  Lymphadenopathy:    She has no cervical adenopathy.  Skin: No rash noted. No erythema.  Tick removed from right mid abdomen without complications.  Removed intact.    Psychiatric: She has a normal mood and affect. Her behavior is normal.    BP 138/80 mmHg  Pulse 86  Temp(Src) 98.2 F (36.8 C) (Oral)  Resp 18  Ht '5\' 4"'  (1.626 m)  Wt 214 lb (97.07 kg)  BMI 36.72 kg/m2  SpO2 96% Wt Readings from Last 3 Encounters:  03/06/16 214 lb (97.07 kg)  09/06/15 204 lb (92.534 kg)  05/26/15 201 lb 9.6 oz (91.445 kg)     Lab Results  Component  Value Date   WBC 7.9 12/08/2015   HGB 14.0 12/08/2015   HCT 41.2 12/08/2015   PLT 354.0 12/08/2015   GLUCOSE 109* 12/08/2015   CHOL 225* 12/08/2015   TRIG 173.0* 12/08/2015   HDL 53.80 12/08/2015   LDLDIRECT 135.3 07/20/2014   LDLCALC 137* 12/08/2015   ALT 21 12/08/2015   AST 20 12/08/2015   NA 140 12/08/2015   K 4.3 12/08/2015   CL 103 12/08/2015   CREATININE 0.93 12/08/2015   BUN 13 12/08/2015   CO2 29 12/08/2015   TSH 5.55* 01/04/2016   HGBA1C 6.3 12/08/2015       Assessment & Plan:   Problem List Items Addressed This Visit    Anemia   Essential hypertension, benign    Blood pressure doing well.  Same medication regimen.  Follow pressures.  Follow metabolic panel.        Relevant Orders   TSH   Basic metabolic panel   History of gestational diabetes    Low carb diet.  Discussed decreased bread intake.  Follow met b and a1c.        Relevant Orders   Hemoglobin A1c   Hypercholesterolemia    Last cholesterol check, pravastatin was increased to 78m q day.  Follow lipid panel and liver function tests.        Relevant Orders   Lipid panel   Hepatic function panel   Obesity (BMI 30.0-34.9)    Discussed diet and exercise.  Follow.       Tick bite with subsequent removal of tick - Primary    Removed without problems.  Monitor for any symptoms.            SEinar Pheasant MD

## 2016-03-06 NOTE — Assessment & Plan Note (Signed)
Removed without problems.  Monitor for any symptoms.

## 2016-04-10 ENCOUNTER — Other Ambulatory Visit (INDEPENDENT_AMBULATORY_CARE_PROVIDER_SITE_OTHER): Payer: 59

## 2016-04-10 DIAGNOSIS — I10 Essential (primary) hypertension: Secondary | ICD-10-CM | POA: Diagnosis not present

## 2016-04-10 DIAGNOSIS — Z8632 Personal history of gestational diabetes: Secondary | ICD-10-CM

## 2016-04-10 DIAGNOSIS — E7800 Pure hypercholesterolemia, unspecified: Secondary | ICD-10-CM | POA: Diagnosis not present

## 2016-04-10 LAB — LIPID PANEL
CHOLESTEROL: 183 mg/dL (ref 0–200)
HDL: 47.3 mg/dL (ref >39.00)
LDL CALC: 101 mg/dL — AB (ref 0–99)
NonHDL: 135.62
TRIGLYCERIDES: 172 mg/dL — AB (ref 0.0–149.0)
Total CHOL/HDL Ratio: 4
VLDL: 34.4 mg/dL (ref 0.0–40.0)

## 2016-04-10 LAB — HEPATIC FUNCTION PANEL
ALK PHOS: 82 U/L (ref 39–117)
ALT: 15 U/L (ref 0–35)
AST: 15 U/L (ref 0–37)
Albumin: 4.3 g/dL (ref 3.5–5.2)
BILIRUBIN DIRECT: 0.1 mg/dL (ref 0.0–0.3)
BILIRUBIN TOTAL: 0.6 mg/dL (ref 0.2–1.2)
TOTAL PROTEIN: 7.5 g/dL (ref 6.0–8.3)

## 2016-04-10 LAB — BASIC METABOLIC PANEL
BUN: 12 mg/dL (ref 6–23)
CHLORIDE: 103 meq/L (ref 96–112)
CO2: 29 meq/L (ref 19–32)
Calcium: 9.5 mg/dL (ref 8.4–10.5)
Creatinine, Ser: 0.94 mg/dL (ref 0.40–1.20)
GFR: 66.65 mL/min (ref >60.00)
GLUCOSE: 108 mg/dL — AB (ref 70–99)
POTASSIUM: 4.4 meq/L (ref 3.5–5.1)
Sodium: 137 mEq/L (ref 135–145)

## 2016-04-10 LAB — HEMOGLOBIN A1C: Hgb A1c MFr Bld: 6 % (ref 4.6–6.5)

## 2016-04-10 LAB — TSH: TSH: 5.8 u[IU]/mL — AB (ref 0.35–4.50)

## 2016-04-11 ENCOUNTER — Encounter: Payer: Self-pay | Admitting: *Deleted

## 2016-04-11 ENCOUNTER — Other Ambulatory Visit: Payer: Self-pay | Admitting: Internal Medicine

## 2016-04-11 DIAGNOSIS — R7989 Other specified abnormal findings of blood chemistry: Secondary | ICD-10-CM

## 2016-04-11 NOTE — Progress Notes (Signed)
Order placed for f/u lab.   

## 2016-04-25 ENCOUNTER — Other Ambulatory Visit: Payer: Self-pay | Admitting: Internal Medicine

## 2016-06-06 ENCOUNTER — Other Ambulatory Visit (INDEPENDENT_AMBULATORY_CARE_PROVIDER_SITE_OTHER): Payer: 59

## 2016-06-06 DIAGNOSIS — R946 Abnormal results of thyroid function studies: Secondary | ICD-10-CM

## 2016-06-06 DIAGNOSIS — R7989 Other specified abnormal findings of blood chemistry: Secondary | ICD-10-CM

## 2016-06-06 LAB — TSH: TSH: 4.13 u[IU]/mL (ref 0.35–4.50)

## 2016-06-07 ENCOUNTER — Encounter: Payer: Self-pay | Admitting: Internal Medicine

## 2016-07-05 ENCOUNTER — Other Ambulatory Visit: Payer: Self-pay | Admitting: Internal Medicine

## 2016-07-20 ENCOUNTER — Telehealth: Payer: Self-pay | Admitting: Internal Medicine

## 2016-07-20 NOTE — Telephone Encounter (Signed)
appt scheduled

## 2016-07-20 NOTE — Telephone Encounter (Signed)
Pt dropped off insurance appeal form and labwork from [facility]. Papers are in a large white envelope located in Dr. Roby LoftsScott's color folder up front.

## 2016-07-20 NOTE — Telephone Encounter (Signed)
Patient needs appointment - please schedule

## 2016-07-24 ENCOUNTER — Encounter: Payer: Self-pay | Admitting: Internal Medicine

## 2016-07-24 ENCOUNTER — Ambulatory Visit (INDEPENDENT_AMBULATORY_CARE_PROVIDER_SITE_OTHER): Payer: 59 | Admitting: Internal Medicine

## 2016-07-24 VITALS — BP 160/98 | HR 81 | Temp 98.6°F | Ht 64.0 in | Wt 203.4 lb

## 2016-07-24 DIAGNOSIS — E7800 Pure hypercholesterolemia, unspecified: Secondary | ICD-10-CM | POA: Diagnosis not present

## 2016-07-24 DIAGNOSIS — Z8632 Personal history of gestational diabetes: Secondary | ICD-10-CM | POA: Diagnosis not present

## 2016-07-24 DIAGNOSIS — E669 Obesity, unspecified: Secondary | ICD-10-CM | POA: Diagnosis not present

## 2016-07-24 DIAGNOSIS — D649 Anemia, unspecified: Secondary | ICD-10-CM

## 2016-07-24 DIAGNOSIS — I10 Essential (primary) hypertension: Secondary | ICD-10-CM

## 2016-07-24 DIAGNOSIS — E66811 Obesity, class 1: Secondary | ICD-10-CM

## 2016-07-24 LAB — BASIC METABOLIC PANEL
BUN: 16 mg/dL (ref 6–23)
CALCIUM: 9.7 mg/dL (ref 8.4–10.5)
CHLORIDE: 103 meq/L (ref 96–112)
CO2: 29 meq/L (ref 19–32)
Creatinine, Ser: 1.02 mg/dL (ref 0.40–1.20)
GFR: 60.59 mL/min (ref >60.00)
Glucose, Bld: 120 mg/dL — ABNORMAL HIGH (ref 70–99)
Potassium: 4.2 mEq/L (ref 3.5–5.1)
SODIUM: 140 meq/L (ref 135–145)

## 2016-07-24 LAB — HEPATIC FUNCTION PANEL
ALBUMIN: 4.2 g/dL (ref 3.5–5.2)
ALT: 18 U/L (ref 0–35)
AST: 16 U/L (ref 0–37)
Alkaline Phosphatase: 74 U/L (ref 39–117)
Bilirubin, Direct: 0 mg/dL (ref 0.0–0.3)
Total Bilirubin: 0.4 mg/dL (ref 0.2–1.2)
Total Protein: 7.5 g/dL (ref 6.0–8.3)

## 2016-07-24 NOTE — Progress Notes (Signed)
Patient ID: Kristen Aguilar, female   DOB: [birth year], 50 y.o.   MRN: [id number]   Subjective:    Patient ID: Kristen Aguilar, female    DOB: [birth year], 50 y.o.   MRN: [id number]  HPI  Patient here for a scheduled follow up.  Also needs her health screening form completed.  She is drinking protein shakes for breakfast.  She is watching her portions.  Trying to adjust her diet.  Has lost weight.  Was noted to have elevated blood pressure on the screening.  Has been well controlled here.  Overall she feels she is doing well.  Some increased stress recently. She feels she is handling things relatively well.  Allergies controlled on zyrtec.  No chest pain.  No sob.  No acid reflux.  No abdominal pain or cramping.  Bowels stable.    Past Medical History:  Diagnosis Date  . Allergy   . Anemia    as a child  . Gestational diabetes    Past Surgical History:  Procedure Laterality Date  . CESAREAN SECTION  2001 & 2004   Family History  Problem Relation Age of Onset  . Hypertension Mother   . Hypertension Father   . Hypertension Maternal Grandmother   . Diabetes Maternal Grandmother   . Breast cancer Paternal Grandmother    Social History   Social History  . Marital status: Married    Spouse name: N/A  . Number of children: 2  . Years of education: N/A   Social History Main Topics  . Smoking status: Never Smoker  . Smokeless tobacco: Never Used  . Alcohol use No  . Drug use: No  . Sexual activity: Not Asked   Other Topics Concern  . None   Social History Narrative  . None    Outpatient Encounter Prescriptions as of 07/24/2016  Medication Sig  . cetirizine (ZYRTEC) 10 MG tablet Take 10 mg by mouth daily.  . Cholecalciferol (VITAMIN D-3 PO) Take by mouth.  . Coenzyme Q10 (CO Q 10) 100 MG CAPS Take 50 mg by mouth daily.   . fish oil-omega-3 fatty acids 1000 MG capsule Take 2 g by mouth daily.  . fluticasone (FLONASE) 50 MCG/ACT nasal spray instill 2 sprays into each  nostril once daily  . lisinopril (PRINIVIL,ZESTRIL) 10 MG tablet take 1 tablet by mouth once daily  . Multiple Vitamin (MULTIVITAMIN WITH MINERALS) TABS Take 1 tablet by mouth daily.  . pravastatin (PRAVACHOL) 20 MG tablet take 1 tablet by mouth once daily   No facility-administered encounter medications on file as of 07/24/2016.     Review of Systems  Constitutional: Negative for appetite change and unexpected weight change.       Has adjusted her diet.  Losing weight.   HENT: Negative for congestion and sinus pressure.   Respiratory: Negative for cough, chest tightness and shortness of breath.   Cardiovascular: Negative for chest pain, palpitations and leg swelling.  Gastrointestinal: Negative for abdominal pain, diarrhea, nausea and vomiting.  Genitourinary: Negative for difficulty urinating and dysuria.  Musculoskeletal: Negative for back pain and joint swelling.  Skin: Negative for color change and rash.  Neurological: Negative for dizziness, light-headedness and headaches.  Psychiatric/Behavioral: Negative for agitation and dysphoric mood.       Objective:     Blood pressure rechecked by me:  134/82  Physical Exam  Constitutional: She appears well-developed and well-nourished. No distress.  HENT:  Nose: Nose normal.  Mouth/Throat: Oropharynx is clear and moist.  Neck: Neck supple. No thyromegaly present.  Cardiovascular: Normal rate and regular rhythm.   Pulmonary/Chest: Breath sounds normal. No respiratory distress. She has no wheezes.  Abdominal: Soft. Bowel sounds are normal. There is no tenderness.  Musculoskeletal: She exhibits no edema or tenderness.  Lymphadenopathy:    She has no cervical adenopathy.  Skin: No rash noted. No erythema.  Psychiatric: She has a normal mood and affect. Her behavior is normal.    BP (!) 160/98   Pulse 81   Temp 98.6 F (37 C) (Oral)   Ht 5\' 4"  (1.626 m)   Wt 203 lb 6.4 oz (92.3 kg)   SpO2 98%   BMI 34.91 kg/m  Wt Readings  from Last 3 Encounters:  07/24/16 203 lb 6.4 oz (92.3 kg)  03/06/16 214 lb (97.1 kg)  09/06/15 204 lb (92.5 kg)     Lab Results  Component Value Date   WBC 7.9 12/08/2015   HGB 14.0 12/08/2015   HCT 41.2 12/08/2015   PLT 354.0 12/08/2015   GLUCOSE 120 (H) 07/24/2016   CHOL 183 04/10/2016   TRIG 172.0 (H) 04/10/2016   HDL 47.30 04/10/2016   LDLDIRECT 135.3 07/20/2014   LDLCALC 101 (H) 04/10/2016   ALT 18 07/24/2016   AST 16 07/24/2016   NA 140 07/24/2016   K 4.2 07/24/2016   CL 103 07/24/2016   CREATININE 1.02 07/24/2016   BUN 16 07/24/2016   CO2 29 07/24/2016   TSH 4.13 06/06/2016   HGBA1C 6.0 04/10/2016       Assessment & Plan:   Problem List Items Addressed This Visit    Anemia    Follow cbc.       Essential hypertension, benign    Blood pressure elevated on check today.  Recheck ok.  Follow pressures.  Follow metabolic panel.  Continue lisinopril.        Relevant Orders   Basic metabolic panel (Completed)   History of gestational diabetes    Low carb diet.  Follow sugar.       Hypercholesterolemia - Primary    Low cholesterol diet and exercise.  Follow lipid panel and liver function tests.  On pravastatin.        Relevant Orders   Hepatic function panel (Completed)   Obesity (BMI 30.0-34.9)    Discussed diet and exercise.  She has adjusted her diet.  Has lost some weight.  Follow.         Other Visit Diagnoses   None.      Dale DurhamSCOTT, Kristen Fohl, MD

## 2016-07-24 NOTE — Progress Notes (Signed)
Pre visit review using our clinic review tool, if applicable. No additional management support is needed unless otherwise documented below in the visit note. 

## 2016-07-29 ENCOUNTER — Encounter: Payer: Self-pay | Admitting: Internal Medicine

## 2016-07-29 NOTE — Assessment & Plan Note (Signed)
Low carb diet.  Follow sugar.

## 2016-07-29 NOTE — Assessment & Plan Note (Signed)
Blood pressure elevated on check today.  Recheck ok.  Follow pressures.  Follow metabolic panel.  Continue lisinopril.

## 2016-07-29 NOTE — Assessment & Plan Note (Signed)
Follow cbc.  

## 2016-07-29 NOTE — Assessment & Plan Note (Signed)
Discussed diet and exercise.  She has adjusted her diet.  Has lost some weight.  Follow.

## 2016-07-29 NOTE — Assessment & Plan Note (Signed)
Low cholesterol diet and exercise.  Follow lipid panel and liver function tests.  On pravastatin.   

## 2016-09-11 ENCOUNTER — Encounter: Payer: Self-pay | Admitting: Internal Medicine

## 2016-09-11 ENCOUNTER — Ambulatory Visit (INDEPENDENT_AMBULATORY_CARE_PROVIDER_SITE_OTHER): Payer: 59 | Admitting: Internal Medicine

## 2016-09-11 ENCOUNTER — Other Ambulatory Visit (HOSPITAL_COMMUNITY)
Admission: RE | Admit: 2016-09-11 | Discharge: 2016-09-11 | Disposition: A | Discharge status: Home / Self Care | Payer: 59 | Source: Ambulatory Visit | Attending: Internal Medicine | Admitting: Internal Medicine

## 2016-09-11 VITALS — BP 162/88 | HR 92 | Temp 98.1°F | Ht 64.0 in | Wt 200.4 lb

## 2016-09-11 DIAGNOSIS — Z124 Encounter for screening for malignant neoplasm of cervix: Secondary | ICD-10-CM

## 2016-09-11 DIAGNOSIS — Z01419 Encounter for gynecological examination (general) (routine) without abnormal findings: Secondary | ICD-10-CM | POA: Diagnosis present

## 2016-09-11 DIAGNOSIS — E7800 Pure hypercholesterolemia, unspecified: Secondary | ICD-10-CM | POA: Diagnosis not present

## 2016-09-11 DIAGNOSIS — Z8632 Personal history of gestational diabetes: Secondary | ICD-10-CM

## 2016-09-11 DIAGNOSIS — Z1239 Encounter for other screening for malignant neoplasm of breast: Secondary | ICD-10-CM

## 2016-09-11 DIAGNOSIS — I10 Essential (primary) hypertension: Secondary | ICD-10-CM

## 2016-09-11 DIAGNOSIS — D649 Anemia, unspecified: Secondary | ICD-10-CM

## 2016-09-11 DIAGNOSIS — Z0000 Encounter for general adult medical examination without abnormal findings: Secondary | ICD-10-CM | POA: Diagnosis not present

## 2016-09-11 DIAGNOSIS — E669 Obesity, unspecified: Secondary | ICD-10-CM

## 2016-09-11 DIAGNOSIS — Z1151 Encounter for screening for human papillomavirus (HPV): Secondary | ICD-10-CM | POA: Insufficient documentation

## 2016-09-11 DIAGNOSIS — Z1231 Encounter for screening mammogram for malignant neoplasm of breast: Secondary | ICD-10-CM

## 2016-09-11 MED ORDER — LISINOPRIL 20 MG PO TABS: 20.0000 mg | ORAL_TABLET | Freq: Every day | ORAL | 2 refills | Status: DC

## 2016-09-11 NOTE — Progress Notes (Signed)
Patient ID: Kristen Aguilar, female   DOB: 09-19-1966, 50 y.o.   MRN: [id number]   Subjective:    Patient ID: Kristen Aguilar, female    DOB: 11-02-1965, 50 y.o.   MRN: [id number]  HPI  Patient here for her physical exam.   She states she is doing well.  Feels good.  Tries to stay active.  No chest pain.  No sob.  No acid reflux.  No abdominal pain or cramping.  Bowels stable.  Increased stress with her relationship, but feels she is handling stress well.  Stress is some better.  Discussed diet and exercise.     Past Medical History:  Diagnosis Date  . Allergy   . Anemia    as a child  . Gestational diabetes    Past Surgical History:  Procedure Laterality Date  . CESAREAN SECTION  2001 & 2004   Family History  Problem Relation Age of Onset  . Hypertension Mother   . Hypertension Father   . Hypertension Maternal Grandmother   . Diabetes Maternal Grandmother   . Breast cancer Paternal Grandmother    Social History   Social History  . Marital status: Married    Spouse name: N/A  . Number of children: 2  . Years of education: N/A   Social History Main Topics  . Smoking status: Never Smoker  . Smokeless tobacco: Never Used  . Alcohol use No  . Drug use: No  . Sexual activity: Not Asked   Other Topics Concern  . None   Social History Narrative  . None    Outpatient Encounter Prescriptions as of 09/11/2016  Medication Sig  . cetirizine (ZYRTEC) 10 MG tablet Take 10 mg by mouth daily.  . Cholecalciferol (VITAMIN D-3 PO) Take by mouth.  . Coenzyme Q10 (CO Q 10) 100 MG CAPS Take 50 mg by mouth daily.   . fish oil-omega-3 fatty acids 1000 MG capsule Take 2 g by mouth daily.  . fluticasone (FLONASE) 50 MCG/ACT nasal spray instill 2 sprays into each nostril once daily  . Multiple Vitamin (MULTIVITAMIN WITH MINERALS) TABS Take 1 tablet by mouth daily.  . pravastatin (PRAVACHOL) 20 MG tablet take 1 tablet by mouth once daily  . [DISCONTINUED] lisinopril  (PRINIVIL,ZESTRIL) 10 MG tablet take 1 tablet by mouth once daily  . lisinopril (PRINIVIL,ZESTRIL) 20 MG tablet Take 1 tablet (20 mg total) by mouth daily.   No facility-administered encounter medications on file as of 09/11/2016.     Review of Systems  Constitutional: Negative for appetite change and unexpected weight change.  HENT: Negative for congestion and sinus pressure.   Eyes: Negative for pain and visual disturbance.  Respiratory: Negative for cough, chest tightness and shortness of breath.   Cardiovascular: Negative for chest pain, palpitations and leg swelling.  Gastrointestinal: Negative for abdominal pain, diarrhea, nausea and vomiting.  Genitourinary: Negative for difficulty urinating and dysuria.  Musculoskeletal: Negative for back pain and joint swelling.  Skin: Negative for color change and rash.  Neurological: Negative for dizziness, light-headedness and headaches.  Hematological: Negative for adenopathy. Does not bruise/bleed easily.  Psychiatric/Behavioral: Negative for agitation and dysphoric mood.       Objective:     Blood pressure rechecked by me:  148/84  Physical Exam  Constitutional: She is oriented to person, place, and time. She appears well-developed and well-nourished. No distress.  HENT:  Nose: Nose normal.  Mouth/Throat: Oropharynx is clear and moist.  Eyes: Right eye exhibits no discharge. Left  eye exhibits no discharge. No scleral icterus.  Neck: Neck supple. No thyromegaly present.  Cardiovascular: Normal rate and regular rhythm.   Pulmonary/Chest: Breath sounds normal. No accessory muscle usage. No tachypnea. No respiratory distress. She has no decreased breath sounds. She has no wheezes. She has no rhonchi. Right breast exhibits no inverted nipple, no mass, no nipple discharge and no tenderness (no axillary adenopathy). Left breast exhibits no inverted nipple, no mass, no nipple discharge and no tenderness (no axilarry adenopathy).  Abdominal:  Soft. Bowel sounds are normal. There is no tenderness.  Genitourinary:  Genitourinary Comments: Normal external genitalia.  Vaginal vault without lesions.  Cervix identified.  Pap smear performed.  Could not appreciate any adnexal masses or tenderness.    Musculoskeletal: She exhibits no edema or tenderness.  Lymphadenopathy:    She has no cervical adenopathy.  Neurological: She is alert and oriented to person, place, and time.  Skin: Skin is warm. No rash noted. No erythema.  Psychiatric: She has a normal mood and affect. Her behavior is normal.    BP (!) 162/88   Pulse 92   Temp 98.1 F (36.7 C) (Oral)   Ht 5\' 4"  (1.626 m)   Wt 200 lb 6.4 oz (90.9 kg)   SpO2 98%   BMI 34.40 kg/m  Wt Readings from Last 3 Encounters:  09/11/16 200 lb 6.4 oz (90.9 kg)  07/24/16 203 lb 6.4 oz (92.3 kg)  03/06/16 214 lb (97.1 kg)     Lab Results  Component Value Date   WBC 7.9 12/08/2015   HGB 14.0 12/08/2015   HCT 41.2 12/08/2015   PLT 354.0 12/08/2015   GLUCOSE 120 (H) 07/24/2016   CHOL 183 04/10/2016   TRIG 172.0 (H) 04/10/2016   HDL 47.30 04/10/2016   LDLDIRECT 135.3 07/20/2014   LDLCALC 101 (H) 04/10/2016   ALT 18 07/24/2016   AST 16 07/24/2016   NA 140 07/24/2016   K 4.2 07/24/2016   CL 103 07/24/2016   CREATININE 1.02 07/24/2016   BUN 16 07/24/2016   CO2 29 07/24/2016   TSH 4.13 06/06/2016   HGBA1C 6.0 04/10/2016       Assessment & Plan:   Problem List Items Addressed This Visit    Anemia    Follow cbc.       Essential hypertension, benign    Blood pressure rechecked and improved, but still elevated.  Will increase lisinopril to 20mg  q day.  Follow pressures.  Follow metabolic panel.        Relevant Medications   lisinopril (PRINIVIL,ZESTRIL) 20 MG tablet   Health care maintenance    Physical today 09/11/16.  PAP 09/11/16.  Mammogram last 05/2015.  Schedule f/u mammogram.  Discussed GI evaluation - colonoscopy.  She declines.  Discussed cologuard.  She will check  with her insurance.        History of gestational diabetes    Low carb diet and exercise.  Follow fasting glucose.        Hypercholesterolemia    Low cholesterol diet and exercise.  On pravastatin.  Follow lipid panel and liver function tests.        Relevant Medications   lisinopril (PRINIVIL,ZESTRIL) 20 MG tablet   Obesity (BMI 30.0-34.9)    Diet and exercise.  Follow.         Other Visit Diagnoses    Breast cancer screening    -  Primary   Relevant Orders   MM DIGITAL SCREENING BILATERAL   Routine cervical  smear       Relevant Orders   Cytology - PAP (Completed)       Dale DurhamSCOTT, Meshach Perry, MD

## 2016-09-11 NOTE — Progress Notes (Signed)
Pre visit review using our clinic review tool, if applicable. No additional management support is needed unless otherwise documented below in the visit note. 

## 2016-09-11 NOTE — Assessment & Plan Note (Signed)
Physical today 09/11/16.  PAP 09/11/16.  Mammogram last 05/2015.  Schedule f/u mammogram.  Discussed GI evaluation - colonoscopy.  She declines.  Discussed cologuard.  She will check with her insurance.

## 2016-09-13 LAB — CYTOLOGY - PAP
Diagnosis: NEGATIVE
HPV: NOT DETECTED

## 2016-09-14 ENCOUNTER — Encounter: Payer: Self-pay | Admitting: Internal Medicine

## 2016-09-16 ENCOUNTER — Encounter: Payer: Self-pay | Admitting: Internal Medicine

## 2016-09-16 NOTE — Assessment & Plan Note (Signed)
Low cholesterol diet and exercise.  On pravastatin.  Follow lipid panel and liver function tests.   

## 2016-09-16 NOTE — Assessment & Plan Note (Signed)
Diet and exercise.  Follow.  

## 2016-09-16 NOTE — Assessment & Plan Note (Signed)
Low carb diet and exercise.  Follow fasting glucose.

## 2016-09-16 NOTE — Assessment & Plan Note (Signed)
Blood pressure rechecked and improved, but still elevated.  Will increase lisinopril to 20mg  q day.  Follow pressures.  Follow metabolic panel.

## 2016-09-16 NOTE — Assessment & Plan Note (Signed)
Follow cbc.  

## 2016-10-17 ENCOUNTER — Ambulatory Visit
Admission: RE | Admit: 2016-10-17 | Discharge: 2016-10-17 | Disposition: A | Discharge status: Home / Self Care | Payer: 59 | Source: Ambulatory Visit | Attending: Internal Medicine | Admitting: Internal Medicine

## 2016-10-17 DIAGNOSIS — Z1239 Encounter for other screening for malignant neoplasm of breast: Secondary | ICD-10-CM

## 2016-10-19 ENCOUNTER — Telehealth: Payer: Self-pay

## 2016-10-19 DIAGNOSIS — E7800 Pure hypercholesterolemia, unspecified: Secondary | ICD-10-CM

## 2016-10-19 DIAGNOSIS — D649 Anemia, unspecified: Secondary | ICD-10-CM

## 2016-10-19 DIAGNOSIS — I10 Essential (primary) hypertension: Secondary | ICD-10-CM

## 2016-10-19 DIAGNOSIS — Z8632 Personal history of gestational diabetes: Secondary | ICD-10-CM

## 2016-10-19 NOTE — Telephone Encounter (Signed)
Pt coming for fasting labs 10/22/16. Please place future orders. Thank you.

## 2016-10-19 NOTE — Telephone Encounter (Signed)
Orders placed for labs

## 2016-10-22 ENCOUNTER — Other Ambulatory Visit (INDEPENDENT_AMBULATORY_CARE_PROVIDER_SITE_OTHER): Payer: 59

## 2016-10-22 DIAGNOSIS — Z8632 Personal history of gestational diabetes: Secondary | ICD-10-CM | POA: Diagnosis not present

## 2016-10-22 DIAGNOSIS — D649 Anemia, unspecified: Secondary | ICD-10-CM

## 2016-10-22 DIAGNOSIS — E7800 Pure hypercholesterolemia, unspecified: Secondary | ICD-10-CM | POA: Diagnosis not present

## 2016-10-22 DIAGNOSIS — I10 Essential (primary) hypertension: Secondary | ICD-10-CM | POA: Diagnosis not present

## 2016-10-22 LAB — BASIC METABOLIC PANEL
BUN: 16 mg/dL (ref 6–23)
CALCIUM: 9.8 mg/dL (ref 8.4–10.5)
CO2: 27 mEq/L (ref 19–32)
Chloride: 103 mEq/L (ref 96–112)
Creatinine, Ser: 0.86 mg/dL (ref 0.40–1.20)
GFR: 73.7 mL/min (ref >60.00)
GLUCOSE: 102 mg/dL — AB (ref 70–99)
POTASSIUM: 5.4 meq/L — AB (ref 3.5–5.1)
Sodium: 141 mEq/L (ref 135–145)

## 2016-10-22 LAB — HEPATIC FUNCTION PANEL
ALBUMIN: 4.4 g/dL (ref 3.5–5.2)
ALK PHOS: 83 U/L (ref 39–117)
ALT: 16 U/L (ref 0–35)
AST: 25 U/L (ref 0–37)
BILIRUBIN DIRECT: 0.1 mg/dL (ref 0.0–0.3)
Total Bilirubin: 0.6 mg/dL (ref 0.2–1.2)
Total Protein: 7.6 g/dL (ref 6.0–8.3)

## 2016-10-22 LAB — HEMOGLOBIN A1C: Hgb A1c MFr Bld: 6.1 % (ref 4.6–6.5)

## 2016-10-22 LAB — CBC WITH DIFFERENTIAL/PLATELET
BASOS ABS: 0.2 10*3/uL — AB (ref 0.0–0.1)
Basophils Relative: 2.5 % (ref 0.0–3.0)
EOS ABS: 0.1 10*3/uL (ref 0.0–0.7)
Eosinophils Relative: 1.4 % (ref 0.0–5.0)
HEMATOCRIT: 42.6 % (ref 36.0–46.0)
HEMOGLOBIN: 14.3 g/dL (ref 12.0–15.0)
LYMPHS PCT: 27.2 % (ref 12.0–46.0)
Lymphs Abs: 2.6 10*3/uL (ref 0.7–4.0)
MCHC: 33.7 g/dL (ref 30.0–36.0)
MCV: 87.5 fl (ref 78.0–100.0)
MONOS PCT: 4.9 % (ref 3.0–12.0)
Monocytes Absolute: 0.5 10*3/uL (ref 0.1–1.0)
NEUTROS ABS: 6.1 10*3/uL (ref 1.4–7.7)
Neutrophils Relative %: 64 % (ref 43.0–77.0)
Platelets: 330 10*3/uL (ref 150.0–400.0)
RBC: 4.87 Mil/uL (ref 3.87–5.11)
RDW: 13.1 % (ref 11.5–15.5)
WBC: 9.6 10*3/uL (ref 4.0–10.5)

## 2016-10-22 LAB — LIPID PANEL
CHOL/HDL RATIO: 4
Cholesterol: 222 mg/dL — ABNORMAL HIGH (ref 0–200)
HDL: 54.5 mg/dL (ref >39.00)
LDL CALC: 136 mg/dL — AB (ref 0–99)
NONHDL: 167.22
Triglycerides: 157 mg/dL — ABNORMAL HIGH (ref 0.0–149.0)
VLDL: 31.4 mg/dL (ref 0.0–40.0)

## 2016-10-22 LAB — TSH: TSH: 3.57 u[IU]/mL (ref 0.35–4.50)

## 2016-10-23 ENCOUNTER — Other Ambulatory Visit (INDEPENDENT_AMBULATORY_CARE_PROVIDER_SITE_OTHER): Payer: 59

## 2016-10-23 ENCOUNTER — Other Ambulatory Visit: Payer: Self-pay | Admitting: Internal Medicine

## 2016-10-23 DIAGNOSIS — E875 Hyperkalemia: Secondary | ICD-10-CM

## 2016-10-23 LAB — POTASSIUM: POTASSIUM: 4.3 meq/L (ref 3.5–5.1)

## 2016-10-23 NOTE — Progress Notes (Signed)
Order placed for f/u potassium.  

## 2016-10-24 ENCOUNTER — Encounter: Payer: Self-pay | Admitting: Internal Medicine

## 2016-10-26 ENCOUNTER — Ambulatory Visit (INDEPENDENT_AMBULATORY_CARE_PROVIDER_SITE_OTHER): Payer: 59 | Admitting: Internal Medicine

## 2016-10-26 ENCOUNTER — Encounter: Payer: Self-pay | Admitting: Internal Medicine

## 2016-10-26 VITALS — BP 138/76 | HR 83 | Temp 98.2°F | Ht 64.0 in | Wt 200.8 lb

## 2016-10-26 DIAGNOSIS — E669 Obesity, unspecified: Secondary | ICD-10-CM

## 2016-10-26 DIAGNOSIS — Z1239 Encounter for other screening for malignant neoplasm of breast: Secondary | ICD-10-CM

## 2016-10-26 DIAGNOSIS — I10 Essential (primary) hypertension: Secondary | ICD-10-CM

## 2016-10-26 DIAGNOSIS — E7800 Pure hypercholesterolemia, unspecified: Secondary | ICD-10-CM | POA: Diagnosis not present

## 2016-10-26 DIAGNOSIS — Z1231 Encounter for screening mammogram for malignant neoplasm of breast: Secondary | ICD-10-CM | POA: Diagnosis not present

## 2016-10-26 DIAGNOSIS — Z8632 Personal history of gestational diabetes: Secondary | ICD-10-CM | POA: Diagnosis not present

## 2016-10-26 NOTE — Progress Notes (Signed)
Patient ID: Kristen Aguilar, female   DOB: [birth year], 51 y.o.   MRN: [id number]   Subjective:    Patient ID: Kristen Aguilar, female    DOB: 05-19-1966, 51 y.o.   MRN: [id number]  HPI  Patient here for a scheduled follow up.  States she is doing relatively well.  Still has the relationship stress.  Overall she feels she is handling things relatively well.  Does not feel needs anything more at this time.  No chest pain.  No sob.  No acid reflux.  No abdominal pain or cramping.  Discussed diet and exercise.     Past Medical History:  Diagnosis Date  . Allergy   . Anemia    as a child  . Gestational diabetes    Past Surgical History:  Procedure Laterality Date  . CESAREAN SECTION  2001 & 2004   Family History  Problem Relation Age of Onset  . Hypertension Mother   . Hypertension Father   . Hypertension Maternal Grandmother   . Diabetes Maternal Grandmother   . Breast cancer Paternal Grandmother    Social History   Social History  . Marital status: Married    Spouse name: N/A  . Number of children: 2  . Years of education: N/A   Social History Main Topics  . Smoking status: Never Smoker  . Smokeless tobacco: Never Used  . Alcohol use No  . Drug use: No  . Sexual activity: Not Asked   Other Topics Concern  . None   Social History Narrative  . None    Outpatient Encounter Prescriptions as of 10/26/2016  Medication Sig  . cetirizine (ZYRTEC) 10 MG tablet Take 10 mg by mouth daily.  . Cholecalciferol (VITAMIN D-3 PO) Take by mouth.  . Coenzyme Q10 (CO Q 10) 100 MG CAPS Take 50 mg by mouth daily.   . fish oil-omega-3 fatty acids 1000 MG capsule Take 2 g by mouth daily.  . fluticasone (FLONASE) 50 MCG/ACT nasal spray instill 2 sprays into each nostril once daily  . lisinopril (PRINIVIL,ZESTRIL) 20 MG tablet Take 1 tablet (20 mg total) by mouth daily.  . Multiple Vitamin (MULTIVITAMIN WITH MINERALS) TABS Take 1 tablet by mouth daily.  . pravastatin (PRAVACHOL) 20 MG  tablet take 1 tablet by mouth once daily   No facility-administered encounter medications on file as of 10/26/2016.     Review of Systems  Constitutional: Negative for appetite change and unexpected weight change.  HENT: Negative for congestion and sinus pressure.   Respiratory: Negative for cough, chest tightness and shortness of breath.   Cardiovascular: Negative for chest pain, palpitations and leg swelling.  Gastrointestinal: Negative for abdominal pain, diarrhea, nausea and vomiting.  Genitourinary: Negative for difficulty urinating and dysuria.  Musculoskeletal: Negative for back pain and joint swelling.  Skin: Negative for color change and rash.  Neurological: Negative for dizziness, light-headedness and headaches.  Psychiatric/Behavioral: Negative for agitation and dysphoric mood.       Objective:    Physical Exam  Constitutional: She appears well-developed and well-nourished. No distress.  HENT:  Nose: Nose normal.  Mouth/Throat: Oropharynx is clear and moist.  Neck: Neck supple. No thyromegaly present.  Cardiovascular: Normal rate and regular rhythm.   Pulmonary/Chest: Breath sounds normal. No respiratory distress. She has no wheezes.  Abdominal: Soft. Bowel sounds are normal. There is no tenderness.  Musculoskeletal: She exhibits no edema or tenderness.  Lymphadenopathy:    She has no cervical adenopathy.  Skin: No rash  noted. No erythema.  Psychiatric: She has a normal mood and affect. Her behavior is normal.    BP 138/76   Pulse 83   Temp 98.2 F (36.8 C) (Oral)   Ht '5\' 4"'  (1.626 m)   Wt 200 lb 12.8 oz (91.1 kg)   SpO2 98%   BMI 34.47 kg/m  Wt Readings from Last 3 Encounters:  10/26/16 200 lb 12.8 oz (91.1 kg)  09/11/16 200 lb 6.4 oz (90.9 kg)  07/24/16 203 lb 6.4 oz (92.3 kg)     Lab Results  Component Value Date   WBC 9.6 10/22/2016   HGB 14.3 10/22/2016   HCT 42.6 10/22/2016   PLT 330.0 10/22/2016   GLUCOSE 102 (H) 10/22/2016   CHOL 222 (H)  10/22/2016   TRIG 157.0 (H) 10/22/2016   HDL 54.50 10/22/2016   LDLDIRECT 135.3 07/20/2014   LDLCALC 136 (H) 10/22/2016   ALT 16 10/22/2016   AST 25 10/22/2016   NA 141 10/22/2016   K 4.3 10/23/2016   CL 103 10/22/2016   CREATININE 0.86 10/22/2016   BUN 16 10/22/2016   CO2 27 10/22/2016   TSH 3.57 10/22/2016   HGBA1C 6.1 10/22/2016       Assessment & Plan:   Problem List Items Addressed This Visit    Essential hypertension, benign    Blood pressure under good control.  Continue same medication regimen.  Follow pressures.  Follow metabolic panel.        Relevant Orders   Basic metabolic panel   History of gestational diabetes    Low carb diet and exercise.  Follow met b and a1c.        Relevant Orders   Hemoglobin A1c   Hypercholesterolemia    On pravastatin.  Low cholesterol diet and exercise.  Follow lipid panel and liver function tests.        Relevant Orders   Hepatic function panel   Lipid panel   Obesity (BMI 30.0-34.9)    Diet and exercise.  Follow.         Other Visit Diagnoses    Breast cancer screening    -  Primary   Relevant Orders   MM DIGITAL SCREENING BILATERAL       Einar Pheasant, MD

## 2016-10-26 NOTE — Progress Notes (Signed)
Pre visit review using our clinic review tool, if applicable. No additional management support is needed unless otherwise documented below in the visit note. 

## 2016-10-28 ENCOUNTER — Encounter: Payer: Self-pay | Admitting: Internal Medicine

## 2016-10-28 NOTE — Assessment & Plan Note (Signed)
Diet and exercise.  Follow.  

## 2016-10-28 NOTE — Assessment & Plan Note (Signed)
Low carb diet and exercise.  Follow met b and a1c.   

## 2016-10-28 NOTE — Assessment & Plan Note (Signed)
Blood pressure under good control.  Continue same medication regimen.  Follow pressures.  Follow metabolic panel.   

## 2016-10-28 NOTE — Assessment & Plan Note (Signed)
On pravastatin.  Low cholesterol diet and exercise.  Follow lipid panel and liver function tests.   

## 2016-12-18 ENCOUNTER — Other Ambulatory Visit: Payer: Self-pay | Admitting: Internal Medicine

## 2016-12-26 DIAGNOSIS — Z9289 Personal history of other medical treatment: Secondary | ICD-10-CM | POA: Diagnosis not present

## 2016-12-26 DIAGNOSIS — Z1231 Encounter for screening mammogram for malignant neoplasm of breast: Secondary | ICD-10-CM | POA: Diagnosis not present

## 2016-12-26 LAB — HM MAMMOGRAPHY

## 2017-01-02 ENCOUNTER — Other Ambulatory Visit: Payer: Self-pay | Admitting: Internal Medicine

## 2017-01-23 ENCOUNTER — Other Ambulatory Visit: Payer: Self-pay

## 2017-01-23 MED ORDER — LISINOPRIL 20 MG PO TABS: 20.0000 mg | ORAL_TABLET | Freq: Every day | ORAL | 0 refills | Status: DC

## 2017-02-26 ENCOUNTER — Other Ambulatory Visit (INDEPENDENT_AMBULATORY_CARE_PROVIDER_SITE_OTHER): Payer: 59

## 2017-02-26 DIAGNOSIS — Z8632 Personal history of gestational diabetes: Secondary | ICD-10-CM

## 2017-02-26 DIAGNOSIS — E7800 Pure hypercholesterolemia, unspecified: Secondary | ICD-10-CM

## 2017-02-26 DIAGNOSIS — I10 Essential (primary) hypertension: Secondary | ICD-10-CM | POA: Diagnosis not present

## 2017-02-26 LAB — LIPID PANEL
CHOLESTEROL: 186 mg/dL (ref 0–200)
HDL: 54.4 mg/dL (ref >39.00)
LDL Cholesterol: 102 mg/dL — ABNORMAL HIGH (ref 0–99)
NonHDL: 131.99
Total CHOL/HDL Ratio: 3
Triglycerides: 152 mg/dL — ABNORMAL HIGH (ref 0.0–149.0)
VLDL: 30.4 mg/dL (ref 0.0–40.0)

## 2017-02-26 LAB — BASIC METABOLIC PANEL
BUN: 16 mg/dL (ref 6–23)
CALCIUM: 9.8 mg/dL (ref 8.4–10.5)
CHLORIDE: 103 meq/L (ref 96–112)
CO2: 28 meq/L (ref 19–32)
Creatinine, Ser: 0.97 mg/dL (ref 0.40–1.20)
GFR: 64.06 mL/min (ref >60.00)
GLUCOSE: 106 mg/dL — AB (ref 70–99)
POTASSIUM: 4.5 meq/L (ref 3.5–5.1)
SODIUM: 138 meq/L (ref 135–145)

## 2017-02-26 LAB — HEPATIC FUNCTION PANEL
ALT: 14 U/L (ref 0–35)
AST: 14 U/L (ref 0–37)
Albumin: 4.4 g/dL (ref 3.5–5.2)
Alkaline Phosphatase: 77 U/L (ref 39–117)
Bilirubin, Direct: 0.1 mg/dL (ref 0.0–0.3)
TOTAL PROTEIN: 7.5 g/dL (ref 6.0–8.3)
Total Bilirubin: 0.7 mg/dL (ref 0.2–1.2)

## 2017-02-26 LAB — HEMOGLOBIN A1C: Hgb A1c MFr Bld: 6.3 % (ref 4.6–6.5)

## 2017-02-27 ENCOUNTER — Ambulatory Visit (INDEPENDENT_AMBULATORY_CARE_PROVIDER_SITE_OTHER): Payer: 59 | Admitting: Internal Medicine

## 2017-02-27 ENCOUNTER — Encounter: Payer: Self-pay | Admitting: Internal Medicine

## 2017-02-27 DIAGNOSIS — I10 Essential (primary) hypertension: Secondary | ICD-10-CM

## 2017-02-27 DIAGNOSIS — Z8632 Personal history of gestational diabetes: Secondary | ICD-10-CM

## 2017-02-27 DIAGNOSIS — E669 Obesity, unspecified: Secondary | ICD-10-CM

## 2017-02-27 DIAGNOSIS — E7800 Pure hypercholesterolemia, unspecified: Secondary | ICD-10-CM | POA: Diagnosis not present

## 2017-02-27 DIAGNOSIS — F439 Reaction to severe stress, unspecified: Secondary | ICD-10-CM | POA: Diagnosis not present

## 2017-02-27 MED ORDER — SERTRALINE HCL 50 MG PO TABS: 50.0000 mg | ORAL_TABLET | Freq: Every day | ORAL | 1 refills | Status: DC

## 2017-02-27 MED ORDER — PRAVASTATIN SODIUM 20 MG PO TABS: 20.0000 mg | ORAL_TABLET | Freq: Every day | ORAL | 1 refills | Status: DC

## 2017-02-27 MED ORDER — FLUTICASONE PROPIONATE 50 MCG/ACT NA SUSP: NASAL | 3 refills | Status: DC

## 2017-02-27 NOTE — Progress Notes (Signed)
Patient ID: Kristen DapperChristine W Aguilar, female   DOB: 1966-08-07, 51 y.o.   MRN: [id number]   Subjective:    Patient ID: Kristen Loronhristine W Aguilar, female    DOB: 1966-08-07, 51 y.o.   MRN: [id number]  HPI  Patient here for a scheduled follow up.  She reports she is doing relatively well.  Still with increased stress.  Discussed with her today.  She feels she is handling things relatively well.  Does feel she needs something to help level things out.  Discussed medications.  Discussed counseling.  Tries to stay active.  No chest pain.  No sob.  No acid reflux.  No abdominal pain.  Bowels stable.    Past Medical History:  Diagnosis Date  . Allergy   . Anemia    as a child  . Gestational diabetes    Past Surgical History:  Procedure Laterality Date  . CESAREAN SECTION  2001 & 2004   Family History  Problem Relation Age of Onset  . Hypertension Mother   . Hypertension Father   . Hypertension Maternal Grandmother   . Diabetes Maternal Grandmother   . Breast cancer Paternal Grandmother    Social History   Social History  . Marital status: Married    Spouse name: N/A  . Number of children: 2  . Years of education: N/A   Social History Main Topics  . Smoking status: Never Smoker  . Smokeless tobacco: Never Used  . Alcohol use No  . Drug use: No  . Sexual activity: Not Asked   Other Topics Concern  . None   Social History Narrative  . None    Outpatient Encounter Prescriptions as of 02/27/2017  Medication Sig  . cetirizine (ZYRTEC) 10 MG tablet Take 10 mg by mouth daily.  . Cholecalciferol (VITAMIN D-3 PO) Take by mouth.  . Coenzyme Q10 (CO Q 10) 100 MG CAPS Take 50 mg by mouth daily.   . fish oil-omega-3 fatty acids 1000 MG capsule Take 2 g by mouth daily.  . fluticasone (FLONASE) 50 MCG/ACT nasal spray instill 2 sprays into each nostril once daily  . lisinopril (PRINIVIL,ZESTRIL) 20 MG tablet Take 1 tablet (20 mg total) by mouth daily.  . Multiple Vitamin (MULTIVITAMIN WITH MINERALS)  TABS Take 1 tablet by mouth daily.  . pravastatin (PRAVACHOL) 20 MG tablet Take 1 tablet (20 mg total) by mouth daily.  . [DISCONTINUED] fluticasone (FLONASE) 50 MCG/ACT nasal spray instill 2 sprays into each nostril once daily  . [DISCONTINUED] pravastatin (PRAVACHOL) 20 MG tablet take 1 tablet by mouth once daily  . sertraline (ZOLOFT) 50 MG tablet Take 1 tablet (50 mg total) by mouth daily.   No facility-administered encounter medications on file as of 02/27/2017.     Review of Systems  Constitutional: Negative for appetite change and unexpected weight change.  HENT: Negative for congestion and sinus pressure.   Respiratory: Negative for cough, chest tightness and shortness of breath.   Cardiovascular: Negative for chest pain, palpitations and leg swelling.  Gastrointestinal: Negative for abdominal pain, diarrhea, nausea and vomiting.  Genitourinary: Negative for difficulty urinating and dysuria.  Musculoskeletal: Negative for back pain and joint swelling.  Skin: Negative for color change and rash.  Neurological: Negative for dizziness, light-headedness and headaches.  Psychiatric/Behavioral: Negative for agitation and dysphoric mood.       Objective:    Physical Exam  Constitutional: She appears well-developed and well-nourished. No distress.  HENT:  Nose: Nose normal.  Mouth/Throat: Oropharynx is clear and  moist.  Neck: Neck supple. No thyromegaly present.  Cardiovascular: Normal rate and regular rhythm.   Pulmonary/Chest: Breath sounds normal. No respiratory distress. She has no wheezes.  Abdominal: Soft. Bowel sounds are normal. There is no tenderness.  Musculoskeletal: She exhibits no edema or tenderness.  Lymphadenopathy:    She has no cervical adenopathy.  Skin: No rash noted. No erythema.  Psychiatric: She has a normal mood and affect. Her behavior is normal.    BP 130/74 (BP Location: Left Arm, Patient Position: Sitting, Cuff Size: Large)   Pulse 81   Temp 98.3  F (36.8 C) (Oral)   Resp 12   Ht 5\' 4"  (1.626 m)   Wt 205 lb 6.4 oz (93.2 kg)   SpO2 98%   BMI 35.26 kg/m  Wt Readings from Last 3 Encounters:  02/27/17 205 lb 6.4 oz (93.2 kg)  10/26/16 200 lb 12.8 oz (91.1 kg)  09/11/16 200 lb 6.4 oz (90.9 kg)     Lab Results  Component Value Date   WBC 9.6 10/22/2016   HGB 14.3 10/22/2016   HCT 42.6 10/22/2016   PLT 330.0 10/22/2016   GLUCOSE 106 (H) 02/26/2017   CHOL 186 02/26/2017   TRIG 152.0 (H) 02/26/2017   HDL 54.40 02/26/2017   LDLDIRECT 135.3 07/20/2014   LDLCALC 102 (H) 02/26/2017   ALT 14 02/26/2017   AST 14 02/26/2017   NA 138 02/26/2017   K 4.5 02/26/2017   CL 103 02/26/2017   CREATININE 0.97 02/26/2017   BUN 16 02/26/2017   CO2 28 02/26/2017   TSH 3.57 10/22/2016   HGBA1C 6.3 02/26/2017       Assessment & Plan:   Problem List Items Addressed This Visit    Essential hypertension, benign    Blood pressure under good control.  Continue same medication regimen.  Follow pressures.  Follow metabolic panel.        Relevant Medications   pravastatin (PRAVACHOL) 20 MG tablet   History of gestational diabetes    Low carb diet and exercise.  Follow metb and a1c.        Hypercholesterolemia    On pravastatin.  Low cholesterol diet and exercise.  Follow lipid panel and liver function tests.        Relevant Medications   pravastatin (PRAVACHOL) 20 MG tablet   Obesity (BMI 30.0-34.9)    Diet and exercise.        Stress    Increased stress as outlined.  Discussed counseling and medication.  She declines counseling.  Discussed starting SSRI.  She is agreeable.  Start zoloft as directed.  Follow.  Get her back in soon to reassess.            Dale Kenilworth, MD

## 2017-02-27 NOTE — Progress Notes (Signed)
Pre-visit discussion using our clinic review tool. No additional management support is needed unless otherwise documented below in the visit note.  

## 2017-02-27 NOTE — Patient Instructions (Signed)
zoloft 50mg  - take 1/2 tablet daily for one week and then increase to one tablet daily.

## 2017-03-01 ENCOUNTER — Ambulatory Visit: Payer: 59 | Admitting: Internal Medicine

## 2017-03-11 ENCOUNTER — Encounter: Payer: Self-pay | Admitting: Internal Medicine

## 2017-03-11 DIAGNOSIS — F439 Reaction to severe stress, unspecified: Secondary | ICD-10-CM | POA: Insufficient documentation

## 2017-03-11 NOTE — Assessment & Plan Note (Signed)
On pravastatin.  Low cholesterol diet and exercise.  Follow lipid panel and liver function tests.   

## 2017-03-11 NOTE — Assessment & Plan Note (Signed)
Increased stress as outlined.  Discussed counseling and medication.  She declines counseling.  Discussed starting SSRI.  She is agreeable.  Start zoloft as directed.  Follow.  Get her back in soon to reassess.

## 2017-03-11 NOTE — Assessment & Plan Note (Signed)
Diet and exercise.   

## 2017-03-11 NOTE — Assessment & Plan Note (Signed)
Low carb diet and exercise.  Follow met b and a1c.  

## 2017-03-11 NOTE — Assessment & Plan Note (Signed)
Blood pressure under good control.  Continue same medication regimen.  Follow pressures.  Follow metabolic panel.   

## 2017-04-05 ENCOUNTER — Other Ambulatory Visit: Payer: Self-pay | Admitting: Internal Medicine

## 2017-04-11 ENCOUNTER — Encounter: Payer: Self-pay | Admitting: Internal Medicine

## 2017-04-11 MED ORDER — SERTRALINE HCL 50 MG PO TABS: 50.0000 mg | ORAL_TABLET | Freq: Every day | ORAL | 1 refills | Status: DC

## 2017-04-15 ENCOUNTER — Encounter: Payer: Self-pay | Admitting: Internal Medicine

## 2017-04-15 ENCOUNTER — Ambulatory Visit (INDEPENDENT_AMBULATORY_CARE_PROVIDER_SITE_OTHER): Payer: 59 | Admitting: Internal Medicine

## 2017-04-15 DIAGNOSIS — F439 Reaction to severe stress, unspecified: Secondary | ICD-10-CM

## 2017-04-15 DIAGNOSIS — R739 Hyperglycemia, unspecified: Secondary | ICD-10-CM | POA: Diagnosis not present

## 2017-04-15 DIAGNOSIS — E7800 Pure hypercholesterolemia, unspecified: Secondary | ICD-10-CM

## 2017-04-15 DIAGNOSIS — E669 Obesity, unspecified: Secondary | ICD-10-CM | POA: Diagnosis not present

## 2017-04-15 DIAGNOSIS — I10 Essential (primary) hypertension: Secondary | ICD-10-CM | POA: Diagnosis not present

## 2017-04-15 DIAGNOSIS — E66811 Obesity, class 1: Secondary | ICD-10-CM

## 2017-04-15 NOTE — Progress Notes (Signed)
Pre-visit discussion using our clinic review tool. No additional management support is needed unless otherwise documented below in the visit note.  

## 2017-04-15 NOTE — Progress Notes (Signed)
Patient ID: RAHMAH MCCAMY, female   DOB: 07-31-1966, 51 y.o.   MRN: [id number]   Subjective:    Patient ID: Judi Saa Guice, female    DOB: 1966-08-21, 51 y.o.   MRN: [id number]  HPI  Patient here for a scheduled follow up.  She is doing better.  Increased stress.  Still present, but feels she is handling things better.  Taking 1/2 zoloft daily.  One whole tablet was too much.  Feels better.  Discussed the persistent stress.  She does not feel she needs anything more at this time.  Discussed diet and exercise.  Discussed recently labs.  No acid reflux.  No abdominal pain. Bowels moving.     Past Medical History:  Diagnosis Date  . Allergy   . Anemia    as a child  . Gestational diabetes    Past Surgical History:  Procedure Laterality Date  . CESAREAN SECTION  2001 & 2004   Family History  Problem Relation Age of Onset  . Hypertension Mother   . Hypertension Father   . Hypertension Maternal Grandmother   . Diabetes Maternal Grandmother   . Breast cancer Paternal Grandmother    Social History   Social History  . Marital status: Married    Spouse name: N/A  . Number of children: 2  . Years of education: N/A   Social History Main Topics  . Smoking status: Never Smoker  . Smokeless tobacco: Never Used  . Alcohol use No  . Drug use: No  . Sexual activity: Not Asked   Other Topics Concern  . None   Social History Narrative  . None    Outpatient Encounter Prescriptions as of 04/15/2017  Medication Sig  . cetirizine (ZYRTEC) 10 MG tablet Take 10 mg by mouth daily.  . Cholecalciferol (VITAMIN D-3 PO) Take by mouth.  . Coenzyme Q10 (CO Q 10) 100 MG CAPS Take 50 mg by mouth daily.   . fish oil-omega-3 fatty acids 1000 MG capsule Take 2 g by mouth daily.  . fluticasone (FLONASE) 50 MCG/ACT nasal spray instill 2 sprays into each nostril once daily  . lisinopril (PRINIVIL,ZESTRIL) 20 MG tablet TAKE 1 TABLET BY MOUTH  DAILY  . Multiple Vitamin (MULTIVITAMIN WITH MINERALS)  TABS Take 1 tablet by mouth daily.  . pravastatin (PRAVACHOL) 20 MG tablet Take 1 tablet (20 mg total) by mouth daily.  . sertraline (ZOLOFT) 50 MG tablet Take 1 tablet (50 mg total) by mouth daily.   No facility-administered encounter medications on file as of 04/15/2017.     Review of Systems  Constitutional: Negative for appetite change and unexpected weight change.  HENT: Negative for congestion and sinus pressure.   Respiratory: Negative for cough, chest tightness and shortness of breath.   Cardiovascular: Negative for chest pain, palpitations and leg swelling.  Gastrointestinal: Negative for abdominal pain, diarrhea, nausea and vomiting.  Genitourinary: Negative for difficulty urinating and dysuria.  Musculoskeletal: Negative for back pain and joint swelling.  Skin: Negative for color change and rash.  Neurological: Negative for dizziness, light-headedness and headaches.  Psychiatric/Behavioral: Negative for agitation and dysphoric mood.       Objective:    Physical Exam  Constitutional: She appears well-developed and well-nourished. No distress.  HENT:  Nose: Nose normal.  Mouth/Throat: Oropharynx is clear and moist.  Neck: Neck supple. No thyromegaly present.  Cardiovascular: Normal rate and regular rhythm.   Pulmonary/Chest: Breath sounds normal. No respiratory distress. She has no wheezes.  Abdominal: Soft.  Bowel sounds are normal. There is no tenderness.  Musculoskeletal: She exhibits no edema or tenderness.  Lymphadenopathy:    She has no cervical adenopathy.  Skin: No rash noted. No erythema.  Psychiatric: She has a normal mood and affect. Her behavior is normal.    BP 136/72   Pulse 66   Temp 98.8 F (37.1 C) (Oral)   Resp 16   Ht 5' 4" (1.626 m)   Wt 200 lb 3.2 oz (90.8 kg)   SpO2 98%   BMI 34.36 kg/m  Wt Readings from Last 3 Encounters:  04/15/17 200 lb 3.2 oz (90.8 kg)  02/27/17 205 lb 6.4 oz (93.2 kg)  10/26/16 200 lb 12.8 oz (91.1 kg)     Lab  Results  Component Value Date   WBC 9.6 10/22/2016   HGB 14.3 10/22/2016   HCT 42.6 10/22/2016   PLT 330.0 10/22/2016   GLUCOSE 106 (H) 02/26/2017   CHOL 186 02/26/2017   TRIG 152.0 (H) 02/26/2017   HDL 54.40 02/26/2017   LDLDIRECT 135.3 07/20/2014   LDLCALC 102 (H) 02/26/2017   ALT 14 02/26/2017   AST 14 02/26/2017   NA 138 02/26/2017   K 4.5 02/26/2017   CL 103 02/26/2017   CREATININE 0.97 02/26/2017   BUN 16 02/26/2017   CO2 28 02/26/2017   TSH 3.57 10/22/2016   HGBA1C 6.3 02/26/2017       Assessment & Plan:   Problem List Items Addressed This Visit    Essential hypertension, benign    Blood pressure under good control.  Continue same medication regimen.  Follow pressures.  Follow metabolic panel.        Relevant Orders   Basic metabolic panel   Hypercholesterolemia    Low cholesterol diet and exercise.  Follow lipid panel and liver function tests.  On pravastatin.        Relevant Orders   Hepatic function panel   Lipid panel   Hyperglycemia    Low carb diet and exercise.  Follow met b and a1c.        Relevant Orders   Hemoglobin A1c   Obesity (BMI 30.0-34.9)    Discussed diet and exercise.  Follow.        Stress    Increased stress as outlined.  Discussed with her today.  She is on zoloft.  Doing well on the medication.  Feels better.  Desires no change and no further intervention at this time.  Follow.            SCOTT, CHARLENE, MD  

## 2017-04-17 ENCOUNTER — Encounter: Payer: Self-pay | Admitting: Internal Medicine

## 2017-04-17 DIAGNOSIS — R739 Hyperglycemia, unspecified: Secondary | ICD-10-CM | POA: Insufficient documentation

## 2017-04-17 NOTE — Assessment & Plan Note (Signed)
Increased stress as outlined.  Discussed with her today.  She is on zoloft.  Doing well on the medication.  Feels better.  Desires no change and no further intervention at this time.  Follow.

## 2017-04-17 NOTE — Assessment & Plan Note (Signed)
Low cholesterol diet and exercise.  Follow lipid panel and liver function tests.  On pravastatin.   

## 2017-04-17 NOTE — Assessment & Plan Note (Signed)
Blood pressure under good control.  Continue same medication regimen.  Follow pressures.  Follow metabolic panel.   

## 2017-04-17 NOTE — Assessment & Plan Note (Signed)
Low carb diet and exercise.  Follow met b and a1c.   

## 2017-04-17 NOTE — Assessment & Plan Note (Signed)
Discussed diet and exercise.  Follow.  

## 2017-07-01 ENCOUNTER — Other Ambulatory Visit: Payer: Self-pay | Admitting: Internal Medicine

## 2017-07-22 ENCOUNTER — Other Ambulatory Visit (INDEPENDENT_AMBULATORY_CARE_PROVIDER_SITE_OTHER): Payer: 59

## 2017-07-22 ENCOUNTER — Telehealth: Payer: Self-pay | Admitting: Internal Medicine

## 2017-07-22 DIAGNOSIS — I10 Essential (primary) hypertension: Secondary | ICD-10-CM | POA: Diagnosis not present

## 2017-07-22 DIAGNOSIS — E7800 Pure hypercholesterolemia, unspecified: Secondary | ICD-10-CM

## 2017-07-22 DIAGNOSIS — R739 Hyperglycemia, unspecified: Secondary | ICD-10-CM | POA: Diagnosis not present

## 2017-07-22 LAB — BASIC METABOLIC PANEL
BUN: 18 mg/dL (ref 6–23)
CHLORIDE: 102 meq/L (ref 96–112)
CO2: 27 mEq/L (ref 19–32)
CREATININE: 0.94 mg/dL (ref 0.40–1.20)
Calcium: 9.7 mg/dL (ref 8.4–10.5)
GFR: 66.32 mL/min (ref >60.00)
Glucose, Bld: 108 mg/dL — ABNORMAL HIGH (ref 70–99)
POTASSIUM: 4.4 meq/L (ref 3.5–5.1)
SODIUM: 137 meq/L (ref 135–145)

## 2017-07-22 LAB — LIPID PANEL
CHOL/HDL RATIO: 3
Cholesterol: 187 mg/dL (ref 0–200)
HDL: 55.2 mg/dL (ref >39.00)
LDL Cholesterol: 109 mg/dL — ABNORMAL HIGH (ref 0–99)
NONHDL: 131.94
Triglycerides: 113 mg/dL (ref 0.0–149.0)
VLDL: 22.6 mg/dL (ref 0.0–40.0)

## 2017-07-22 LAB — HEPATIC FUNCTION PANEL
ALK PHOS: 77 U/L (ref 39–117)
ALT: 13 U/L (ref 0–35)
AST: 13 U/L (ref 0–37)
Albumin: 4.3 g/dL (ref 3.5–5.2)
BILIRUBIN DIRECT: 0.1 mg/dL (ref 0.0–0.3)
TOTAL PROTEIN: 7.5 g/dL (ref 6.0–8.3)
Total Bilirubin: 0.5 mg/dL (ref 0.2–1.2)

## 2017-07-22 LAB — HEMOGLOBIN A1C: Hgb A1c MFr Bld: 6.3 % (ref 4.6–6.5)

## 2017-07-22 NOTE — Addendum Note (Signed)
Addended by: Penne Lash on: 07/22/2017 08:30 AM   Modules accepted: Orders

## 2017-07-22 NOTE — Telephone Encounter (Signed)
Pt dropped off Health form to be filled out. Paced in Dr. Lorin Picket folder upfront

## 2017-07-23 ENCOUNTER — Encounter: Payer: Self-pay | Admitting: Internal Medicine

## 2017-07-23 NOTE — Telephone Encounter (Signed)
Holding for app.

## 2017-07-23 NOTE — Telephone Encounter (Signed)
Put in your red folder patient has app on 10/11 with you she would like to pick up then.

## 2017-07-23 NOTE — Telephone Encounter (Signed)
Form placed back in box to hold until her appt.  Will d/w her at appt and then complete.

## 2017-07-25 ENCOUNTER — Encounter: Payer: Self-pay | Admitting: Internal Medicine

## 2017-07-25 ENCOUNTER — Ambulatory Visit (INDEPENDENT_AMBULATORY_CARE_PROVIDER_SITE_OTHER): Payer: 59 | Admitting: Internal Medicine

## 2017-07-25 DIAGNOSIS — E669 Obesity, unspecified: Secondary | ICD-10-CM | POA: Diagnosis not present

## 2017-07-25 DIAGNOSIS — E7800 Pure hypercholesterolemia, unspecified: Secondary | ICD-10-CM

## 2017-07-25 DIAGNOSIS — R739 Hyperglycemia, unspecified: Secondary | ICD-10-CM | POA: Diagnosis not present

## 2017-07-25 DIAGNOSIS — I10 Essential (primary) hypertension: Secondary | ICD-10-CM | POA: Diagnosis not present

## 2017-07-25 DIAGNOSIS — F439 Reaction to severe stress, unspecified: Secondary | ICD-10-CM

## 2017-07-25 MED ORDER — CITALOPRAM HYDROBROMIDE 10 MG PO TABS: 10.0000 mg | ORAL_TABLET | Freq: Every day | ORAL | 1 refills | Status: DC

## 2017-07-25 MED ORDER — NYSTATIN 100000 UNIT/ML MT SUSP: 5.0000 mL | Freq: Three times a day (TID) | OROMUCOSAL | 0 refills | Status: DC | PRN

## 2017-07-25 NOTE — Progress Notes (Signed)
Patient ID: Kristen Aguilar, female   DOB: Sep 24, 1966, 51 y.o.   MRN: [id number]   Subjective:    Patient ID: Kristen Aguilar, female    DOB: 1966-10-14, 51 y.o.   MRN: [id number]  HPI  Patient here for a scheduled follow up.  She reports she is doing relatively well.  Increased stress.  Was on zoloft.  Felt was helping.  With the increased stress, she felt she needed to increase her zoloft.  She increased zoloft to bid.  She reports that after she increased the dose, she noticed an increased sour taste in her mouth. Also noticed thrush.  Would leave a weird taste in her mouth.  She cut back to 1/2 tablet per day.  Sour taste improved.  She still has increased stress.  Feels needs something more to help control things.  States otherwise she is doing relatively well.  No chest pain.  No sob.  No acid reflux.  No abdominal pain.  Bowels moving.     Past Medical History:  Diagnosis Date  . Allergy   . Anemia    as a child  . Gestational diabetes    Past Surgical History:  Procedure Laterality Date  . CESAREAN SECTION  2001 & 2004   Family History  Problem Relation Age of Onset  . Hypertension Mother   . Hypertension Father   . Hypertension Maternal Grandmother   . Diabetes Maternal Grandmother   . Breast cancer Paternal Grandmother    Social History   Social History  . Marital status: Married    Spouse name: N/A  . Number of children: 2  . Years of education: N/A   Social History Main Topics  . Smoking status: Never Smoker  . Smokeless tobacco: Never Used  . Alcohol use No  . Drug use: No  . Sexual activity: Not Asked   Other Topics Concern  . None   Social History Narrative  . None    Outpatient Encounter Prescriptions as of 07/25/2017  Medication Sig  . cetirizine (ZYRTEC) 10 MG tablet Take 10 mg by mouth daily.  . Cholecalciferol (VITAMIN D-3 PO) Take by mouth.  . Coenzyme Q10 (CO Q 10) 100 MG CAPS Take 50 mg by mouth daily.   . fish oil-omega-3 fatty acids  1000 MG capsule Take 2 g by mouth daily.  . fluticasone (FLONASE) 50 MCG/ACT nasal spray instill 2 sprays into each nostril once daily  . lisinopril (PRINIVIL,ZESTRIL) 20 MG tablet TAKE 1 TABLET BY MOUTH  DAILY  . Multiple Vitamin (MULTIVITAMIN WITH MINERALS) TABS Take 1 tablet by mouth daily.  . pravastatin (PRAVACHOL) 20 MG tablet TAKE 1 TABLET BY MOUTH  DAILY  . [DISCONTINUED] sertraline (ZOLOFT) 50 MG tablet Take 1 tablet (50 mg total) by mouth daily.  . citalopram (CELEXA) 10 MG tablet Take 1 tablet (10 mg total) by mouth daily.  Marland Kitchen nystatin (MYCOSTATIN) 100000 UNIT/ML suspension Take 5 mLs (500,000 Units total) by mouth 3 (three) times daily as needed. Swish and spit   No facility-administered encounter medications on file as of 07/25/2017.     Review of Systems  Constitutional: Negative for appetite change and unexpected weight change.  HENT: Negative for congestion and sinus pressure.   Respiratory: Negative for cough, chest tightness and shortness of breath.   Cardiovascular: Negative for chest pain, palpitations and leg swelling.  Gastrointestinal: Negative for abdominal pain, diarrhea, nausea and vomiting.  Genitourinary: Negative for difficulty urinating and dysuria.  Musculoskeletal: Negative for joint  swelling and myalgias.  Skin: Negative for color change and rash.  Neurological: Negative for dizziness, light-headedness and headaches.  Psychiatric/Behavioral: Negative for agitation and dysphoric mood.       Objective:     Blood pressure rechecked by me:  134/78  Physical Exam  Constitutional: She appears well-developed and well-nourished. No distress.  HENT:  Nose: Nose normal.  Mouth/Throat: Oropharynx is clear and moist.  Neck: Neck supple. No thyromegaly present.  Cardiovascular: Normal rate and regular rhythm.   Pulmonary/Chest: Breath sounds normal. No respiratory distress. She has no wheezes.  Abdominal: Soft. Bowel sounds are normal. There is no tenderness.    Musculoskeletal: She exhibits no edema or tenderness.  Lymphadenopathy:    She has no cervical adenopathy.  Skin: No rash noted. No erythema.  Psychiatric: She has a normal mood and affect. Her behavior is normal.    BP 134/78   Pulse 83   Temp 98.5 F (36.9 C) (Oral)   Resp 14   Ht 5' 4" (1.626 m)   Wt 205 lb (93 kg)   SpO2 96%   BMI 35.19 kg/m  Wt Readings from Last 3 Encounters:  07/25/17 205 lb (93 kg)  04/15/17 200 lb 3.2 oz (90.8 kg)  02/27/17 205 lb 6.4 oz (93.2 kg)     Lab Results  Component Value Date   WBC 9.6 10/22/2016   HGB 14.3 10/22/2016   HCT 42.6 10/22/2016   PLT 330.0 10/22/2016   GLUCOSE 108 (H) 07/22/2017   CHOL 187 07/22/2017   TRIG 113.0 07/22/2017   HDL 55.20 07/22/2017   LDLDIRECT 135.3 07/20/2014   LDLCALC 109 (H) 07/22/2017   ALT 13 07/22/2017   AST 13 07/22/2017   NA 137 07/22/2017   K 4.4 07/22/2017   CL 102 07/22/2017   CREATININE 0.94 07/22/2017   BUN 18 07/22/2017   CO2 27 07/22/2017   TSH 3.57 10/22/2016   HGBA1C 6.3 07/22/2017       Assessment & Plan:   Problem List Items Addressed This Visit    Essential hypertension, benign    Blood pressure on recheck improved.  Will have her spot check her pressure.  Continue same medication regimen.  Follow pressures and follow metabolic panel.        Hypercholesterolemia    On pravastatin.  Low cholesterol diet and exercise.  Follow lipid panel and liver function tests.        Hyperglycemia    Low carb diet and exercise.  Follow met b and a1c.        Obesity (BMI 30.0-34.9)    Diet and exercise.  Follow.        Stress    Increased stress as outlined.  On zoloft.  Felt needed to increase the dose as outlined.  Intolerance.  Wants to change medication.  Only taking 1/2 23m q day.  Citalopram 155mq day.  Stop zoloft.  Follow closely.  Get her back in soon to reassess.  Discussed counseling.            SCEinar PheasantMD

## 2017-07-28 ENCOUNTER — Encounter: Payer: Self-pay | Admitting: Internal Medicine

## 2017-07-28 NOTE — Assessment & Plan Note (Signed)
Blood pressure on recheck improved.  Will have her spot check her pressure.  Continue same medication regimen.  Follow pressures and follow metabolic panel.

## 2017-07-28 NOTE — Assessment & Plan Note (Signed)
Low carb diet and exercise.  Follow met b and a1c.

## 2017-07-28 NOTE — Assessment & Plan Note (Signed)
Diet and exercise.  Follow.  

## 2017-07-28 NOTE — Assessment & Plan Note (Signed)
On pravastatin.  Low cholesterol diet and exercise.  Follow lipid panel and liver function tests.   

## 2017-07-28 NOTE — Assessment & Plan Note (Signed)
Increased stress as outlined.  On zoloft.  Felt needed to increase the dose as outlined.  Intolerance.  Wants to change medication.  Only taking 1/2  q day.  Citalopram  q day.  Stop zoloft.  Follow closely.  Get her back in soon to reassess.  Discussed counseling.

## 2017-09-16 ENCOUNTER — Encounter: Payer: Self-pay | Admitting: Internal Medicine

## 2017-09-16 MED ORDER — CITALOPRAM HYDROBROMIDE 20 MG PO TABS: 20.0000 mg | ORAL_TABLET | Freq: Every day | ORAL | 1 refills | Status: DC

## 2017-09-16 NOTE — Telephone Encounter (Signed)
Per my chart message, dose of citalopram was increased to 20mg  q day.  rx sent in for #30 with one refill.

## 2017-09-17 ENCOUNTER — Other Ambulatory Visit: Payer: Self-pay | Admitting: *Deleted

## 2017-09-17 MED ORDER — CITALOPRAM HYDROBROMIDE 20 MG PO TABS: 20.0000 mg | ORAL_TABLET | Freq: Every day | ORAL | 1 refills | Status: DC

## 2017-09-26 ENCOUNTER — Encounter: Payer: 59 | Admitting: Internal Medicine

## 2017-10-03 ENCOUNTER — Other Ambulatory Visit: Payer: Self-pay | Admitting: Internal Medicine

## 2017-11-28 ENCOUNTER — Other Ambulatory Visit: Payer: Self-pay | Admitting: Internal Medicine

## 2017-12-06 ENCOUNTER — Other Ambulatory Visit: Payer: Self-pay | Admitting: Internal Medicine

## 2017-12-30 ENCOUNTER — Other Ambulatory Visit: Payer: Self-pay | Admitting: Internal Medicine

## 2018-01-14 ENCOUNTER — Ambulatory Visit (INDEPENDENT_AMBULATORY_CARE_PROVIDER_SITE_OTHER): Payer: 59 | Admitting: Internal Medicine

## 2018-01-14 ENCOUNTER — Encounter: Payer: Self-pay | Admitting: Internal Medicine

## 2018-01-14 VITALS — BP 134/78 | HR 77 | Temp 97.8°F | Resp 18 | Wt 216.0 lb

## 2018-01-14 DIAGNOSIS — D649 Anemia, unspecified: Secondary | ICD-10-CM | POA: Diagnosis not present

## 2018-01-14 DIAGNOSIS — Z6837 Body mass index (BMI) 37.0-37.9, adult: Secondary | ICD-10-CM

## 2018-01-14 DIAGNOSIS — I10 Essential (primary) hypertension: Secondary | ICD-10-CM

## 2018-01-14 DIAGNOSIS — Z0000 Encounter for general adult medical examination without abnormal findings: Secondary | ICD-10-CM | POA: Diagnosis not present

## 2018-01-14 DIAGNOSIS — Z1231 Encounter for screening mammogram for malignant neoplasm of breast: Secondary | ICD-10-CM

## 2018-01-14 DIAGNOSIS — Z1211 Encounter for screening for malignant neoplasm of colon: Secondary | ICD-10-CM | POA: Diagnosis not present

## 2018-01-14 DIAGNOSIS — R739 Hyperglycemia, unspecified: Secondary | ICD-10-CM

## 2018-01-14 DIAGNOSIS — E7800 Pure hypercholesterolemia, unspecified: Secondary | ICD-10-CM

## 2018-01-14 DIAGNOSIS — Z1239 Encounter for other screening for malignant neoplasm of breast: Secondary | ICD-10-CM

## 2018-01-14 DIAGNOSIS — F439 Reaction to severe stress, unspecified: Secondary | ICD-10-CM | POA: Diagnosis not present

## 2018-01-14 NOTE — Progress Notes (Signed)
Patient ID: Kristen Aguilar, female   DOB: 1966/06/11, 52 y.o.   MRN: [id number]   Subjective:    Patient ID: Kristen Aguilar, female    DOB: 03-19-1966, 52 y.o.   MRN: [id number]  HPI  Patient here for her physical exam.  She reports she is doing well.  Feels good.  Handling stress.  On citalopram.  Doing well on this medication.  Discussed diet and exercise.  No chest pain.  No sob.  No acid reflux.  No abdominal pain.  Bowels moving.  Discussed the need for colonoscopy.     Past Medical History:  Diagnosis Date  . Allergy   . Anemia    as a child  . Gestational diabetes    Past Surgical History:  Procedure Laterality Date  . CESAREAN SECTION  2001 & 2004   Family History  Problem Relation Age of Onset  . Hypertension Mother   . Hypertension Father   . Hypertension Maternal Grandmother   . Diabetes Maternal Grandmother   . Breast cancer Paternal Grandmother    Social History   Socioeconomic History  . Marital status: Married    Spouse name: Not on file  . Number of children: 2  . Years of education: Not on file  . Highest education level: Not on file  Occupational History  . Not on file  Social Needs  . Financial resource strain: Not on file  . Food insecurity:    Worry: Not on file    Inability: Not on file  . Transportation needs:    Medical: Not on file    Non-medical: Not on file  Tobacco Use  . Smoking status: Never Smoker  . Smokeless tobacco: Never Used  Substance and Sexual Activity  . Alcohol use: No    Alcohol/week: 0.0 oz  . Drug use: No  . Sexual activity: Not on file  Lifestyle  . Physical activity:    Days per week: Not on file    Minutes per session: Not on file  . Stress: Not on file  Relationships  . Social connections:    Talks on phone: Not on file    Gets together: Not on file    Attends religious service: Not on file    Active member of club or organization: Not on file    Attends meetings of clubs or organizations: Not on  file    Relationship status: Not on file  Other Topics Concern  . Not on file  Social History Narrative  . Not on file    Outpatient Encounter Medications as of 01/14/2018  Medication Sig  . cetirizine (ZYRTEC) 10 MG tablet Take 10 mg by mouth daily.  . Cholecalciferol (VITAMIN D-3 PO) Take by mouth.  . citalopram (CELEXA) 20 MG tablet TAKE 1 TABLET BY MOUTH  DAILY  . Coenzyme Q10 (CO Q 10) 100 MG CAPS Take 50 mg by mouth daily.   . fish oil-omega-3 fatty acids 1000 MG capsule Take 2 g by mouth daily.  . fluticasone (FLONASE) 50 MCG/ACT nasal spray instill 2 sprays into each nostril once daily  . lisinopril (PRINIVIL,ZESTRIL) 20 MG tablet TAKE 1 TABLET BY MOUTH  DAILY  . Multiple Vitamin (MULTIVITAMIN WITH MINERALS) TABS Take 1 tablet by mouth daily.  Marland Kitchen nystatin (MYCOSTATIN) 100000 UNIT/ML suspension Take 5 mLs (500,000 Units total) by mouth 3 (three) times daily as needed. Swish and spit  . pravastatin (PRAVACHOL) 20 MG tablet TAKE 1 TABLET BY MOUTH  DAILY  No facility-administered encounter medications on file as of 01/14/2018.     Review of Systems  Constitutional: Negative for appetite change and unexpected weight change.  HENT: Negative for congestion and sinus pressure.   Eyes: Negative for pain and visual disturbance.  Respiratory: Negative for cough, chest tightness and shortness of breath.   Cardiovascular: Negative for chest pain, palpitations and leg swelling.  Gastrointestinal: Negative for abdominal pain, diarrhea, nausea and vomiting.  Genitourinary: Negative for difficulty urinating and dysuria.  Musculoskeletal: Negative for joint swelling and myalgias.  Skin: Negative for color change and rash.  Neurological: Negative for dizziness, light-headedness and headaches.  Hematological: Negative for adenopathy. Does not bruise/bleed easily.  Psychiatric/Behavioral: Negative for agitation and dysphoric mood.       Objective:     Blood pressure rechecked by me:   134/78  Physical Exam  Constitutional: She is oriented to person, place, and time. She appears well-developed and well-nourished. No distress.  HENT:  Nose: Nose normal.  Mouth/Throat: Oropharynx is clear and moist.  Eyes: Right eye exhibits no discharge. Left eye exhibits no discharge. No scleral icterus.  Neck: Neck supple. No thyromegaly present.  Cardiovascular: Normal rate and regular rhythm.  Pulmonary/Chest: Breath sounds normal. No accessory muscle usage. No tachypnea. No respiratory distress. She has no decreased breath sounds. She has no wheezes. She has no rhonchi. Right breast exhibits no inverted nipple, no mass, no nipple discharge and no tenderness (no axillary adenopathy). Left breast exhibits no inverted nipple, no mass, no nipple discharge and no tenderness (no axilarry adenopathy).  Abdominal: Soft. Bowel sounds are normal. There is no tenderness.  Musculoskeletal: She exhibits no edema or tenderness.  Lymphadenopathy:    She has no cervical adenopathy.  Neurological: She is alert and oriented to person, place, and time.  Skin: Skin is warm. No rash noted. No erythema.  Psychiatric: She has a normal mood and affect. Her behavior is normal.    BP 134/78   Pulse 77   Temp 97.8 F (36.6 C) (Oral)   Resp 18   Wt 216 lb (98 kg)   SpO2 96%   BMI 37.08 kg/m  Wt Readings from Last 3 Encounters:  01/14/18 216 lb (98 kg)  07/25/17 205 lb (93 kg)  04/15/17 200 lb 3.2 oz (90.8 kg)     Lab Results  Component Value Date   WBC 9.1 01/14/2018   HGB 14.3 01/14/2018   HCT 43.6 01/14/2018   PLT 337 01/14/2018   GLUCOSE 105 (H) 01/14/2018   CHOL 199 01/14/2018   TRIG 131 01/14/2018   HDL 56 01/14/2018   LDLDIRECT 135.3 07/20/2014   LDLCALC 117 (H) 01/14/2018   ALT 17 01/14/2018   AST 22 01/14/2018   NA 143 01/14/2018   K 5.2 01/14/2018   CL 103 01/14/2018   CREATININE 0.99 01/14/2018   BUN 15 01/14/2018   CO2 23 01/14/2018   TSH 5.750 (H) 01/14/2018   HGBA1C  6.3 (H) 01/14/2018       Assessment & Plan:   Problem List Items Addressed This Visit    Anemia    Follow cbc.       Relevant Orders   CBC with Differential/Platelet (Completed)   BMI 37.0-37.9, adult    Discussed diet and exercise.  Follow.       Essential hypertension, benign    Blood pressure under good control.  Continue same medication regimen.  Follow pressures.  Follow metabolic panel.  Relevant Orders   Basic metabolic panel (Completed)   TSH (Completed)   Health care maintenance    Physical today 01/14/18.  PAP 09/11/16 negative with negative HPV.  Mammogram 12/27/16 - I.  Schedule f/u mammogram.  She is now agreeable to colonoscopy.  Place order for referral.       Hypercholesterolemia    On pravastatin.  Low cholesterol diet and exercise.  Follow lipid panel and liver function tests.        Relevant Orders   Lipid panel (Completed)   Hepatic function panel (Completed)   Hyperglycemia    Low carb diet and exercise.  Follow met b and a1c.      Relevant Orders   Hemoglobin A1c (Completed)   Stress    Doing well on citalopram.  Follow.        Other Visit Diagnoses    Breast cancer screening    -  Primary   Relevant Orders   MM DIGITAL SCREENING BILATERAL   Colon cancer screening       Relevant Orders   Ambulatory referral to Gastroenterology       Einar Pheasant, MD

## 2018-01-14 NOTE — Assessment & Plan Note (Addendum)
Physical today 01/14/18.  PAP 09/11/16 negative with negative HPV.  Mammogram 12/27/16 - I.  Schedule f/u mammogram.  She is now agreeable to colonoscopy.  Place order for referral.

## 2018-01-15 LAB — CBC WITH DIFFERENTIAL/PLATELET
Basophils Absolute: 0 10*3/uL (ref 0.0–0.2)
Basos: 0 %
EOS (ABSOLUTE): 0.2 10*3/uL (ref 0.0–0.4)
Eos: 2 %
Hematocrit: 43.6 % (ref 34.0–46.6)
Hemoglobin: 14.3 g/dL (ref 11.1–15.9)
IMMATURE GRANS (ABS): 0 10*3/uL (ref 0.0–0.1)
IMMATURE GRANULOCYTES: 0 %
LYMPHS: 23 %
Lymphocytes Absolute: 2.1 10*3/uL (ref 0.7–3.1)
MCH: 29.8 pg (ref 26.6–33.0)
MCHC: 32.8 g/dL (ref 31.5–35.7)
MCV: 91 fL (ref 79–97)
MONOS ABS: 0.6 10*3/uL (ref 0.1–0.9)
Monocytes: 7 %
NEUTROS PCT: 68 %
Neutrophils Absolute: 6.1 10*3/uL (ref 1.4–7.0)
PLATELETS: 337 10*3/uL (ref 150–379)
RBC: 4.8 x10E6/uL (ref 3.77–5.28)
RDW: 13.3 % (ref 12.3–15.4)
WBC: 9.1 10*3/uL (ref 3.4–10.8)

## 2018-01-15 LAB — HEPATIC FUNCTION PANEL
ALBUMIN: 4.5 g/dL (ref 3.5–5.5)
ALK PHOS: 90 IU/L (ref 39–117)
ALT: 17 IU/L (ref 0–32)
AST: 22 IU/L (ref 0–40)
Bilirubin Total: 0.5 mg/dL (ref 0.0–1.2)
Bilirubin, Direct: 0.13 mg/dL (ref 0.00–0.40)
Total Protein: 7.3 g/dL (ref 6.0–8.5)

## 2018-01-15 LAB — LIPID PANEL
CHOLESTEROL TOTAL: 199 mg/dL (ref 100–199)
Chol/HDL Ratio: 3.6 ratio (ref 0.0–4.4)
HDL: 56 mg/dL (ref >39)
LDL Calculated: 117 mg/dL — ABNORMAL HIGH (ref 0–99)
TRIGLYCERIDES: 131 mg/dL (ref 0–149)
VLDL Cholesterol Cal: 26 mg/dL (ref 5–40)

## 2018-01-15 LAB — BASIC METABOLIC PANEL
BUN/Creatinine Ratio: 15 (ref 9–23)
BUN: 15 mg/dL (ref 6–24)
CALCIUM: 9.9 mg/dL (ref 8.7–10.2)
CO2: 23 mmol/L (ref 20–29)
CREATININE: 0.99 mg/dL (ref 0.57–1.00)
Chloride: 103 mmol/L (ref 96–106)
GFR, EST AFRICAN AMERICAN: 75 mL/min/{1.73_m2} (ref >59)
GFR, EST NON AFRICAN AMERICAN: 65 mL/min/{1.73_m2} (ref >59)
Glucose: 105 mg/dL — ABNORMAL HIGH (ref 65–99)
Potassium: 5.2 mmol/L (ref 3.5–5.2)
Sodium: 143 mmol/L (ref 134–144)

## 2018-01-15 LAB — HEMOGLOBIN A1C
Est. average glucose Bld gHb Est-mCnc: 134 mg/dL
HEMOGLOBIN A1C: 6.3 % — AB (ref 4.8–5.6)

## 2018-01-15 LAB — TSH: TSH: 5.75 u[IU]/mL — ABNORMAL HIGH (ref 0.450–4.500)

## 2018-01-16 ENCOUNTER — Other Ambulatory Visit: Payer: Self-pay | Admitting: Internal Medicine

## 2018-01-16 DIAGNOSIS — R7989 Other specified abnormal findings of blood chemistry: Secondary | ICD-10-CM

## 2018-01-16 NOTE — Progress Notes (Signed)
Order placed for f/u tsh.  

## 2018-01-20 ENCOUNTER — Encounter: Payer: Self-pay | Admitting: Internal Medicine

## 2018-01-20 NOTE — Assessment & Plan Note (Signed)
Low carb diet and exercise.  Follow met b and a1c.

## 2018-01-20 NOTE — Assessment & Plan Note (Signed)
Follow cbc.  

## 2018-01-20 NOTE — Assessment & Plan Note (Signed)
Discussed diet and exercise.  Follow.  

## 2018-01-20 NOTE — Assessment & Plan Note (Signed)
Blood pressure under good control.  Continue same medication regimen.  Follow pressures.  Follow metabolic panel.   

## 2018-01-20 NOTE — Assessment & Plan Note (Signed)
On pravastatin.  Low cholesterol diet and exercise.  Follow lipid panel and liver function tests.   

## 2018-01-20 NOTE — Assessment & Plan Note (Signed)
Doing well on citalopram.  Follow.   

## 2018-01-21 DIAGNOSIS — Z1231 Encounter for screening mammogram for malignant neoplasm of breast: Secondary | ICD-10-CM | POA: Diagnosis not present

## 2018-01-21 LAB — HM MAMMOGRAPHY

## 2018-01-27 ENCOUNTER — Other Ambulatory Visit: Payer: Self-pay | Admitting: Internal Medicine

## 2018-02-28 ENCOUNTER — Other Ambulatory Visit: Payer: Self-pay

## 2018-02-28 DIAGNOSIS — Z1211 Encounter for screening for malignant neoplasm of colon: Secondary | ICD-10-CM

## 2018-02-28 MED ORDER — PEG 3350-KCL-NABCB-NACL-NASULF 236 G PO SOLR: 4000.0000 mL | Freq: Once | ORAL | 0 refills | Status: AC

## 2018-03-04 ENCOUNTER — Other Ambulatory Visit (INDEPENDENT_AMBULATORY_CARE_PROVIDER_SITE_OTHER): Payer: 59

## 2018-03-04 DIAGNOSIS — R7989 Other specified abnormal findings of blood chemistry: Secondary | ICD-10-CM

## 2018-03-04 NOTE — Addendum Note (Signed)
Addended by: Penne Lash on: 03/04/2018 01:41 PM   Modules accepted: Orders

## 2018-03-05 LAB — TSH: TSH: 5.29 u[IU]/mL — ABNORMAL HIGH (ref 0.450–4.500)

## 2018-03-06 ENCOUNTER — Other Ambulatory Visit: Payer: Self-pay | Admitting: Internal Medicine

## 2018-03-06 DIAGNOSIS — E7800 Pure hypercholesterolemia, unspecified: Secondary | ICD-10-CM

## 2018-03-06 DIAGNOSIS — R7989 Other specified abnormal findings of blood chemistry: Secondary | ICD-10-CM

## 2018-03-06 DIAGNOSIS — R739 Hyperglycemia, unspecified: Secondary | ICD-10-CM

## 2018-03-06 DIAGNOSIS — I10 Essential (primary) hypertension: Secondary | ICD-10-CM

## 2018-03-06 NOTE — Progress Notes (Signed)
Orders placed for f/u labs.  

## 2018-04-03 ENCOUNTER — Other Ambulatory Visit: Payer: Self-pay | Admitting: Internal Medicine

## 2018-05-16 ENCOUNTER — Other Ambulatory Visit (INDEPENDENT_AMBULATORY_CARE_PROVIDER_SITE_OTHER): Payer: 59

## 2018-05-16 DIAGNOSIS — I10 Essential (primary) hypertension: Secondary | ICD-10-CM | POA: Diagnosis not present

## 2018-05-16 DIAGNOSIS — R7989 Other specified abnormal findings of blood chemistry: Secondary | ICD-10-CM | POA: Diagnosis not present

## 2018-05-16 DIAGNOSIS — E7800 Pure hypercholesterolemia, unspecified: Secondary | ICD-10-CM | POA: Diagnosis not present

## 2018-05-16 DIAGNOSIS — R739 Hyperglycemia, unspecified: Secondary | ICD-10-CM | POA: Diagnosis not present

## 2018-05-17 ENCOUNTER — Encounter: Payer: Self-pay | Admitting: Internal Medicine

## 2018-05-17 LAB — BASIC METABOLIC PANEL
BUN/Creatinine Ratio: 17 (ref 9–23)
BUN: 14 mg/dL (ref 6–24)
CALCIUM: 9.8 mg/dL (ref 8.7–10.2)
CO2: 23 mmol/L (ref 20–29)
CREATININE: 0.83 mg/dL (ref 0.57–1.00)
Chloride: 101 mmol/L (ref 96–106)
GFR calc Af Amer: 93 mL/min/{1.73_m2} (ref >59)
GFR calc non Af Amer: 81 mL/min/{1.73_m2} (ref >59)
GLUCOSE: 104 mg/dL — AB (ref 65–99)
POTASSIUM: 5.1 mmol/L (ref 3.5–5.2)
Sodium: 140 mmol/L (ref 134–144)

## 2018-05-17 LAB — LIPID PANEL
Chol/HDL Ratio: 3.5 ratio (ref 0.0–4.4)
Cholesterol, Total: 184 mg/dL (ref 100–199)
HDL: 52 mg/dL (ref >39)
LDL CALC: 101 mg/dL — AB (ref 0–99)
Triglycerides: 156 mg/dL — ABNORMAL HIGH (ref 0–149)
VLDL CHOLESTEROL CAL: 31 mg/dL (ref 5–40)

## 2018-05-17 LAB — HEPATIC FUNCTION PANEL
ALT: 22 IU/L (ref 0–32)
AST: 20 IU/L (ref 0–40)
Albumin: 4.4 g/dL (ref 3.5–5.5)
Alkaline Phosphatase: 88 IU/L (ref 39–117)
BILIRUBIN TOTAL: 0.4 mg/dL (ref 0.0–1.2)
BILIRUBIN, DIRECT: 0.12 mg/dL (ref 0.00–0.40)
TOTAL PROTEIN: 7 g/dL (ref 6.0–8.5)

## 2018-05-17 LAB — HEMOGLOBIN A1C
ESTIMATED AVERAGE GLUCOSE: 131 mg/dL
HEMOGLOBIN A1C: 6.2 % — AB (ref 4.8–5.6)

## 2018-05-17 LAB — TSH: TSH: 6.49 u[IU]/mL — AB (ref 0.450–4.500)

## 2018-05-20 ENCOUNTER — Ambulatory Visit (INDEPENDENT_AMBULATORY_CARE_PROVIDER_SITE_OTHER): Payer: 59 | Admitting: Internal Medicine

## 2018-05-20 ENCOUNTER — Encounter: Payer: Self-pay | Admitting: Internal Medicine

## 2018-05-20 VITALS — BP 138/82 | HR 73 | Temp 97.7°F | Resp 18 | Wt 217.0 lb

## 2018-05-20 DIAGNOSIS — E7800 Pure hypercholesterolemia, unspecified: Secondary | ICD-10-CM

## 2018-05-20 DIAGNOSIS — Z8632 Personal history of gestational diabetes: Secondary | ICD-10-CM

## 2018-05-20 DIAGNOSIS — Z1159 Encounter for screening for other viral diseases: Secondary | ICD-10-CM

## 2018-05-20 DIAGNOSIS — Z6837 Body mass index (BMI) 37.0-37.9, adult: Secondary | ICD-10-CM | POA: Diagnosis not present

## 2018-05-20 DIAGNOSIS — I10 Essential (primary) hypertension: Secondary | ICD-10-CM

## 2018-05-20 DIAGNOSIS — D649 Anemia, unspecified: Secondary | ICD-10-CM

## 2018-05-20 DIAGNOSIS — R739 Hyperglycemia, unspecified: Secondary | ICD-10-CM

## 2018-05-20 DIAGNOSIS — F439 Reaction to severe stress, unspecified: Secondary | ICD-10-CM

## 2018-05-20 MED ORDER — PRAVASTATIN SODIUM 40 MG PO TABS: 40.0000 mg | ORAL_TABLET | Freq: Every day | ORAL | 1 refills | Status: DC

## 2018-05-20 MED ORDER — CITALOPRAM HYDROBROMIDE 20 MG PO TABS: ORAL_TABLET | ORAL | 1 refills | Status: DC

## 2018-05-20 MED ORDER — PRAVASTATIN SODIUM 40 MG PO TABS: 40.0000 mg | ORAL_TABLET | Freq: Every day | ORAL | 3 refills | Status: DC

## 2018-05-20 NOTE — Progress Notes (Signed)
mmPatient ID: Kristen Aguilar, female   DOB: 1966-04-12, 52 y.o.   MRN: [id number]   Subjective:    Patient ID: Kristen Aguilar, female    DOB: December 27, 1965, 52 y.o.   MRN: [id number]  HPI  Patient here for a scheduled follow up.  She reports increased stress and feeling overwhelmed.  Discussed with her today.  Family stress. She feels the citalopram has been helping.  For a few days last week, she increased her citalopram dose.  Feels this helped and asks if she can increase the citalopram dose.  Has gained weight.  Discussed diet and exercise.  No chest pain.  Breathing stable.  No acid reflux.  No nausea or vomiting.  Bowels moving.  Discussed recently labs.  Discussed increading pravastatin.  Also discussed referral to lifestyles for weight loss.      Past Medical History:  Diagnosis Date  . Allergy   . Anemia    as a child  . Gestational diabetes    Past Surgical History:  Procedure Laterality Date  . CESAREAN SECTION  2001 & 2004   Family History  Problem Relation Age of Onset  . Hypertension Mother   . Hypertension Father   . Hypertension Maternal Grandmother   . Diabetes Maternal Grandmother   . Breast cancer Paternal Grandmother    Social History   Socioeconomic History  . Marital status: Married    Spouse name: Not on file  . Number of children: 2  . Years of education: Not on file  . Highest education level: Not on file  Occupational History  . Not on file  Social Needs  . Financial resource strain: Not on file  . Food insecurity:    Worry: Not on file    Inability: Not on file  . Transportation needs:    Medical: Not on file    Non-medical: Not on file  Tobacco Use  . Smoking status: Never Smoker  . Smokeless tobacco: Never Used  Substance and Sexual Activity  . Alcohol use: No    Alcohol/week: 0.0 oz  . Drug use: No  . Sexual activity: Not on file  Lifestyle  . Physical activity:    Days per week: Not on file    Minutes per session: Not on file    . Stress: Not on file  Relationships  . Social connections:    Talks on phone: Not on file    Gets together: Not on file    Attends religious service: Not on file    Active member of club or organization: Not on file    Attends meetings of clubs or organizations: Not on file    Relationship status: Not on file  Other Topics Concern  . Not on file  Social History Narrative  . Not on file    Outpatient Encounter Medications as of 05/20/2018  Medication Sig  . cetirizine (ZYRTEC) 10 MG tablet Take 10 mg by mouth daily.  . Cholecalciferol (VITAMIN D-3 PO) Take by mouth.  . citalopram (CELEXA) 20 MG tablet Take 1 1/2 tablet per day  . Coenzyme Q10 (CO Q 10) 100 MG CAPS Take 50 mg by mouth daily.   . fish oil-omega-3 fatty acids 1000 MG capsule Take 2 g by mouth daily.  . fluticasone (FLONASE) 50 MCG/ACT nasal spray instill 2 sprays into each nostril once daily  . lisinopril (PRINIVIL,ZESTRIL) 20 MG tablet TAKE 1 TABLET BY MOUTH  DAILY  . Multiple Vitamin (MULTIVITAMIN WITH MINERALS) TABS Take  1 tablet by mouth daily.  Marland Kitchen nystatin (MYCOSTATIN) 100000 UNIT/ML suspension Take 5 mLs (500,000 Units total) by mouth 3 (three) times daily as needed. Swish and spit  . [DISCONTINUED] citalopram (CELEXA) 20 MG tablet TAKE 1 TABLET BY MOUTH  DAILY  . [DISCONTINUED] pravastatin (PRAVACHOL) 20 MG tablet TAKE 1 TABLET BY MOUTH  DAILY  . pravastatin (PRAVACHOL) 40 MG tablet Take 1 tablet (40 mg total) by mouth daily.  . [DISCONTINUED] pravastatin (PRAVACHOL) 40 MG tablet Take 1 tablet (40 mg total) by mouth daily.   No facility-administered encounter medications on file as of 05/20/2018.     Review of Systems  Constitutional: Negative for appetite change and unexpected weight change.  HENT: Negative for congestion and sinus pressure.   Respiratory: Negative for cough, chest tightness and shortness of breath.   Cardiovascular: Negative for chest pain, palpitations and leg swelling.  Gastrointestinal:  Negative for abdominal pain, diarrhea, nausea and vomiting.  Genitourinary: Negative for difficulty urinating and dysuria.  Musculoskeletal: Negative for joint swelling and myalgias.  Skin: Negative for color change and rash.  Neurological: Negative for dizziness, light-headedness and headaches.  Psychiatric/Behavioral: Negative for agitation and dysphoric mood.       Increased stress as outlined.  Feels overwhelmed.         Objective:     Blood pressure rechecked by me:  138/82  Physical Exam  Constitutional: She appears well-developed and well-nourished. No distress.  HENT:  Nose: Nose normal.  Mouth/Throat: Oropharynx is clear and moist.  Neck: Neck supple. No thyromegaly present.  Cardiovascular: Normal rate and regular rhythm.  Pulmonary/Chest: Breath sounds normal. No respiratory distress. She has no wheezes.  Abdominal: Soft. Bowel sounds are normal. There is no tenderness.  Musculoskeletal: She exhibits no edema or tenderness.  Lymphadenopathy:    She has no cervical adenopathy.  Skin: No rash noted. No erythema.  Psychiatric: She has a normal mood and affect. Her behavior is normal.    BP 138/82   Pulse 73   Temp 97.7 F (36.5 C) (Oral)   Resp 18   Wt 217 lb (98.4 kg)   SpO2 98%   BMI 37.25 kg/m  Wt Readings from Last 3 Encounters:  05/20/18 217 lb (98.4 kg)  01/14/18 216 lb (98 kg)  07/25/17 205 lb (93 kg)     Lab Results  Component Value Date   WBC 9.1 01/14/2018   HGB 14.3 01/14/2018   HCT 43.6 01/14/2018   PLT 337 01/14/2018   GLUCOSE 104 (H) 05/16/2018   CHOL 184 05/16/2018   TRIG 156 (H) 05/16/2018   HDL 52 05/16/2018   LDLDIRECT 135.3 07/20/2014   LDLCALC 101 (H) 05/16/2018   ALT 22 05/16/2018   AST 20 05/16/2018   NA 140 05/16/2018   K 5.1 05/16/2018   CL 101 05/16/2018   CREATININE 0.83 05/16/2018   BUN 14 05/16/2018   CO2 23 05/16/2018   TSH 6.490 (H) 05/16/2018   HGBA1C 6.2 (H) 05/16/2018       Assessment & Plan:   Problem  List Items Addressed This Visit    Anemia    Follow cbc.       BMI 37.0-37.9, adult    Discussed diet and exercise.  Discussed lifestyles referral for nutrition assessment.        Essential hypertension, benign    Blood pressure as outlined.  Have her spot check her pressure.  Get her back in soon to reassess.  Relevant Medications   pravastatin (PRAVACHOL) 40 MG tablet   History of gestational diabetes    Follow met b and a1c.        Hypercholesterolemia    On pravastatin.  Low cholesterol diet and exercise.  Reviewed recent labs.  Will increase pravastatin to 25m q day.        Relevant Medications   pravastatin (PRAVACHOL) 40 MG tablet   Hyperglycemia    Low carb diet and exercise.  Follow met b and a1c.        Stress    On citalopram.  Increased stress.  Discussed with her today.  She feels needs to increase dose.  Tried taking 360mq day last week and feels helped.  Will increase to 3076m day.  Follow.         Other Visit Diagnoses    Measles screening    -  Primary   Relevant Orders   Measles/Mumps/Rubella Immunity (Completed)       SCOEinar PheasantD

## 2018-05-21 ENCOUNTER — Encounter: Payer: Self-pay | Admitting: Internal Medicine

## 2018-05-21 LAB — MEASLES/MUMPS/RUBELLA IMMUNITY
MUMPS ABS, IGG: <9 AU/mL — ABNORMAL LOW (ref >10.9)
RUBELLA: 2.66 {index} (ref >0.99)
RUBEOLA AB, IGG: <25 AU/mL — ABNORMAL LOW (ref >29.9)

## 2018-05-21 NOTE — Assessment & Plan Note (Signed)
On pravastatin.  Low cholesterol diet and exercise.  Reviewed recent labs.  Will increase pravastatin to 40mg  q day.

## 2018-05-21 NOTE — Assessment & Plan Note (Signed)
On citalopram.  Increased stress.  Discussed with her today.  She feels needs to increase dose.  Tried taking $RemoveBefor eDEID_IUSbNPWgzEpYNxiaymEJBkADarPVLIib$30mgto 30mg  q day.  Follow.

## 2018-05-21 NOTE — Assessment & Plan Note (Signed)
Follow met b and a1c.

## 2018-05-21 NOTE — Assessment & Plan Note (Signed)
Low carb diet and exercise.  Follow met b and a1c.   

## 2018-05-21 NOTE — Assessment & Plan Note (Signed)
Blood pressure as outlined.  Have her spot check her pressure.  Get her back in soon to reassess.   

## 2018-05-21 NOTE — Assessment & Plan Note (Signed)
Discussed diet and exercise.  Discussed lifestyles referral for nutrition assessment.

## 2018-05-21 NOTE — Assessment & Plan Note (Signed)
Follow cbc.  

## 2018-05-28 ENCOUNTER — Other Ambulatory Visit: Payer: Self-pay

## 2018-05-28 MED ORDER — NA SULFATE-K SULFATE-MG SULF 17.5-3.13-1.6 GM/177ML PO SOLN: 1.0000 | Freq: Once | ORAL | 0 refills | Status: AC

## 2018-06-09 ENCOUNTER — Ambulatory Visit: Payer: 59 | Admitting: Certified Registered Nurse Anesthetist

## 2018-06-09 ENCOUNTER — Ambulatory Visit
Admission: RE | Admit: 2018-06-09 | Discharge: 2018-06-09 | Disposition: A | Discharge status: Home / Self Care | Payer: 59 | Source: Ambulatory Visit | Attending: Gastroenterology | Admitting: Gastroenterology

## 2018-06-09 ENCOUNTER — Encounter
Admission: RE | Disposition: A | Discharge status: Home / Self Care | Payer: Self-pay | Source: Ambulatory Visit | Attending: Gastroenterology

## 2018-06-09 DIAGNOSIS — F329 Major depressive disorder, single episode, unspecified: Secondary | ICD-10-CM | POA: Diagnosis not present

## 2018-06-09 DIAGNOSIS — Z79899 Other long term (current) drug therapy: Secondary | ICD-10-CM | POA: Insufficient documentation

## 2018-06-09 DIAGNOSIS — Z1211 Encounter for screening for malignant neoplasm of colon: Secondary | ICD-10-CM | POA: Insufficient documentation

## 2018-06-09 DIAGNOSIS — R7303 Prediabetes: Secondary | ICD-10-CM | POA: Diagnosis not present

## 2018-06-09 DIAGNOSIS — I10 Essential (primary) hypertension: Secondary | ICD-10-CM | POA: Insufficient documentation

## 2018-06-09 DIAGNOSIS — Z880 Allergy status to penicillin: Secondary | ICD-10-CM | POA: Insufficient documentation

## 2018-06-09 DIAGNOSIS — Z7951 Long term (current) use of inhaled steroids: Secondary | ICD-10-CM | POA: Insufficient documentation

## 2018-06-09 DIAGNOSIS — F419 Anxiety disorder, unspecified: Secondary | ICD-10-CM | POA: Insufficient documentation

## 2018-06-09 HISTORY — DX: Anxiety disorder, unspecified: F41.9

## 2018-06-09 HISTORY — DX: Essential (primary) hypertension: I10

## 2018-06-09 HISTORY — PX: COLONOSCOPY WITH PROPOFOL: SHX5780

## 2018-06-09 HISTORY — DX: Prediabetes: R73.03

## 2018-06-09 HISTORY — DX: Major depressive disorder, single episode, unspecified: F32.9

## 2018-06-09 HISTORY — DX: Depression, unspecified: F32.A

## 2018-06-09 LAB — POCT PREGNANCY, URINE: PREG TEST UR: NEGATIVE

## 2018-06-09 SURGERY — COLONOSCOPY WITH PROPOFOL
Anesthesia: General

## 2018-06-09 MED ORDER — PROPOFOL 500 MG/50ML IV EMUL
INTRAVENOUS | Status: DC | PRN
  Administered 2018-06-09: 100 ug/kg/min via INTRAVENOUS

## 2018-06-09 MED ORDER — LIDOCAINE HCL (CARDIAC) PF 100 MG/5ML IV SOSY
PREFILLED_SYRINGE | INTRAVENOUS | Status: DC | PRN
  Administered 2018-06-09: 100 mg via INTRAVENOUS

## 2018-06-09 MED ORDER — LIDOCAINE HCL (PF) 2 % IJ SOLN
INTRAMUSCULAR | Status: AC
  Filled 2018-06-09: qty 20

## 2018-06-09 MED ORDER — SODIUM CHLORIDE 0.9 % IV SOLN
INTRAVENOUS | Status: DC
  Administered 2018-06-09: 09:00:00 via INTRAVENOUS

## 2018-06-09 MED ORDER — PROPOFOL 10 MG/ML IV BOLUS
INTRAVENOUS | Status: AC
  Filled 2018-06-09: qty 80

## 2018-06-09 MED ORDER — PROPOFOL 10 MG/ML IV BOLUS
INTRAVENOUS | Status: DC | PRN
  Administered 2018-06-09: 50 mg via INTRAVENOUS
  Administered 2018-06-09: 70 mg via INTRAVENOUS
  Administered 2018-06-09: 50 mg via INTRAVENOUS
  Administered 2018-06-09: 30 mg via INTRAVENOUS

## 2018-06-09 NOTE — Transfer of Care (Signed)
Immediate Anesthesia Transfer of Care Note  Patient: Kristen Aguilar  Procedure(s) Performed: COLONOSCOPY WITH PROPOFOL (N/A )  Patient Location: Endoscopy Unit  Anesthesia Type:General  Level of Consciousness: awake, oriented, drowsy and patient cooperative  Airway & Oxygen Therapy: Patient Spontanous Breathing  Post-op Assessment: Report given to RN, Post -op Vital signs reviewed and stable and Patient moving all extremities  Post vital signs: Reviewed and stable  Last Vitals:  Vitals Value Taken Time  BP 120/68 06/09/2018  9:17 AM  Temp    Pulse 94 06/09/2018  9:17 AM  Resp 16 06/09/2018  9:17 AM  SpO2 97 % 06/09/2018  9:17 AM  Vitals shown include unvalidated device data.  Last Pain:  Vitals:   06/09/18 0840  TempSrc: Tympanic  PainSc: 0-No pain         Complications: No apparent anesthesia complications

## 2018-06-09 NOTE — Anesthesia Preprocedure Evaluation (Signed)
Anesthesia Evaluation  Patient identified by MRN, date of birth, ID band Patient awake    Reviewed: Allergy & Precautions, NPO status , Patient's Chart, lab work & pertinent test results  History of Anesthesia Complications Negative for: history of anesthetic complications  Airway Mallampati: II       Dental   Pulmonary neg sleep apnea, neg COPD,           Cardiovascular hypertension, Pt. on medications (-) Past MI and (-) CHF (-) dysrhythmias (-) Valvular Problems/Murmurs     Neuro/Psych neg Seizures    GI/Hepatic Neg liver ROS, neg GERD  ,  Endo/Other  diabetes (borderline)  Renal/GU negative Renal ROS     Musculoskeletal   Abdominal   Peds  Hematology  (+) anemia ,   Anesthesia Other Findings   Reproductive/Obstetrics                             Anesthesia Physical Anesthesia Plan  ASA: II  Anesthesia Plan: General   Post-op Pain Management:    Induction:   PONV Risk Score and Plan: 3 and Midazolam, TIVA and Propofol infusion  Airway Management Planned: Nasal Cannula  Additional Equipment:   Intra-op Plan:   Post-operative Plan:   Informed Consent: I have reviewed the patients History and Physical, chart, labs and discussed the procedure including the risks, benefits and alternatives for the proposed anesthesia with the patient or authorized representative who has indicated his/her understanding and acceptance.     Plan Discussed with:   Anesthesia Plan Comments:         Anesthesia Quick Evaluation

## 2018-06-09 NOTE — H&P (Signed)
Kristen MoodKiran Dorrene Bently, MD [address], [address], [place], [place], [address] [address], [address], [place], [place], [address] Phone: [phone]  Fax: [phone]  Primary Care Physician:  Dale DurhamScott, Charlene, MD   Pre-Procedure History & Physical: HPI:  Shawnie DapperChristine W Aguilar is a 52 y.o. female is here for an colonoscopy.   Past Medical History:  Diagnosis Date  . Allergy   . Anemia    as a child  . Anxiety   . Depression   . Gestational diabetes   . Hypertension   . Pre-diabetes     Past Surgical History:  Procedure Laterality Date  . CESAREAN SECTION  2001 & 2004    Prior to Admission medications   Medication Sig Start Date End Date Taking? Authorizing Provider  cetirizine (ZYRTEC) 10 MG tablet Take 10 mg by mouth daily.   Yes [provider]  citalopram (CELEXA) 20 MG tablet Take 1 1/2 tablet per day 05/20/18  Yes Dale DurhamScott, Charlene, MD  fluticasone (FLONASE) 50 MCG/ACT nasal spray instill 2 sprays into each nostril once daily 02/27/17  Yes Dale DurhamScott, Charlene, MD  lisinopril (PRINIVIL,ZESTRIL) 20 MG tablet TAKE 1 TABLET BY MOUTH  DAILY 12/30/17  Yes Dale DurhamScott, Charlene, MD  pravastatin (PRAVACHOL) 40 MG tablet Take 1 tablet (40 mg total) by mouth daily. 05/20/18  Yes Dale DurhamScott, Charlene, MD  Cholecalciferol (VITAMIN D-3 PO) Take by mouth.    [provider]  Coenzyme Q10 (CO Q 10) 100 MG CAPS Take 50 mg by mouth daily.     [provider]  fish oil-omega-3 fatty acids 1000 MG capsule Take 2 g by mouth daily.    [provider]  Multiple Vitamin (MULTIVITAMIN WITH MINERALS) TABS Take 1 tablet by mouth daily.    [provider]  nystatin (MYCOSTATIN) 100000 UNIT/ML suspension Take 5 mLs (500,000 Units total) by mouth 3 (three) times daily as needed. Swish and spit 07/25/17   Dale DurhamScott, Charlene, MD    Allergies as of 02/28/2018 - Review Complete 01/20/2018  Allergen Reaction Noted  . Penicillins  02/06/2013    Family History  Problem  Relation Age of Onset  . Hypertension Mother   . Hypertension Father   . Hypertension Maternal Grandmother   . Diabetes Maternal Grandmother   . Breast cancer Paternal Grandmother     Social History   Socioeconomic History  . Marital status: Married    Spouse name: Not on file  . Number of children: 2  . Years of education: Not on file  . Highest education level: Not on file  Occupational History  . Not on file  Social Needs  . Financial resource strain: Not on file  . Food insecurity:    Worry: Not on file    Inability: Not on file  . Transportation needs:    Medical: Not on file    Non-medical: Not on file  Tobacco Use  . Smoking status: Never Smoker  . Smokeless tobacco: Never Used  Substance and Sexual Activity  . Alcohol use: No    Alcohol/week: 0.0 standard drinks  . Drug use: No  . Sexual activity: Not on file  Lifestyle  . Physical activity:    Days per week: Not on file    Minutes per session: Not on file  . Stress: Not on file  Relationships  . Social connections:    Talks on phone: Not on file    Gets together: Not on file  Attends religious service: Not on file    Active member of club or organization: Not on file    Attends meetings of clubs or organizations: Not on file    Relationship status: Not on file  . Intimate partner violence:    Fear of current or ex partner: Not on file    Emotionally abused: Not on file    Physically abused: Not on file    Forced sexual activity: Not on file  Other Topics Concern  . Not on file  Social History Narrative  . Not on file    Review of Systems: See HPI, otherwise negative ROS  Physical Exam: BP (!) 174/86   Pulse 90   Temp (!) 97.1 F (36.2 C) (Tympanic)   Resp 20   Ht 5\' 4"  (1.626 m)   Wt 97.5 kg   LMP 10/09/2014   SpO2 98%   BMI 36.90 kg/m  General:   Alert,  pleasant and cooperative in NAD Head:  Normocephalic and atraumatic. Neck:  Supple; no masses or thyromegaly. Lungs:  Clear  throughout to auscultation, normal respiratory effort.    Heart:  +S1, +S2, Regular rate and rhythm, No edema. Abdomen:  Soft, nontender and nondistended. Normal bowel sounds, without guarding, and without rebound.   Neurologic:  Alert and  oriented x4;  grossly normal neurologically.  Impression/Plan: Kristen Aguilar is here for an colonoscopy to be performed for Screening colonoscopy average risk   Risks, benefits, limitations, and alternatives regarding  colonoscopy have been reviewed with the patient.  Questions have been answered.  All parties agreeable.   Kristen Mood, MD  06/09/2018, 8:47 AM

## 2018-06-09 NOTE — Op Note (Signed)
[facility] Gastroenterology Patient Name: Kristen AgeeChristine Gadomski Procedure Date: 06/09/2018 8:49 AM MRN: [id number] Account #: [id number] Date of Birth: 1966/05/12 Admit Type: Outpatient Age: 52 [facility] ROOM 4 Gender: Female Note Status: Finalized Procedure:            Colonoscopy Indications:          Screening for colorectal malignant neoplasm Providers:            Wyline MoodKiran Sahvanna Mcmanigal MD, MD Referring MD:         Dale Durhamharlene Scott, MD (Referring MD) Medicines:            Monitored Anesthesia Care Complications:        No immediate complications. Procedure:            Pre-Anesthesia Assessment:                       - Prior to the procedure, a History and Physical was                        performed, and patient medications, allergies and                        sensitivities were reviewed. The patient's tolerance of                        previous anesthesia was reviewed.                       - The risks and benefits of the procedure and the                        sedation options and risks were discussed with the                        patient. All questions were answered and informed                        consent was obtained.                       - ASA Grade Assessment: II - A patient with mild                        systemic disease.                       After obtaining informed consent, the colonoscope was                        passed under direct vision. Throughout the procedure,                        the patient's blood pressure, pulse, and oxygen                        saturations were monitored continuously. The                        Colonoscope was introduced through the anus and  advanced to the the cecum, identified by the                        appendiceal orifice, IC valve and transillumination.                        The colonoscopy was performed with ease. The patient                        tolerated the procedure well. The  quality of the bowel                        preparation was good. Findings:      The perianal and digital rectal examinations were normal.      The entire examined colon appeared normal on direct and retroflexion       views. Impression:           - The entire examined colon is normal on direct and                        retroflexion views.                       - No specimens collected. Recommendation:       - Discharge patient to home (with escort).                       - Resume previous diet.                       - Continue present medications.                       - Repeat colonoscopy in 10 years for screening purposes. Procedure Code(s):    --- Professional ---                       [phone], Colonoscopy, flexible; diagnostic, including                        collection of specimen(s) by brushing or washing, when                        performed (separate procedure) Diagnosis Code(s):    --- Professional ---                       Z12.11, Encounter for screening for malignant neoplasm                        of colon CPT copyright 2017 American Medical Association. All rights reserved. The codes documented in this report are preliminary and upon coder review may  be revised to meet current compliance requirements. Wyline Mood, MD Wyline Mood MD, MD 06/09/2018 9:13:32 AM This report has been signed electronically. Number of Addenda: 0 Note Initiated On: 06/09/2018 8:49 AM Scope Withdrawal Time: 0 hours 8 minutes 19 seconds  Total Procedure Duration: 0 hours 11 minutes 21 seconds       [facility]

## 2018-06-09 NOTE — Anesthesia Post-op Follow-up Note (Signed)
Anesthesia QCDR form completed.        

## 2018-06-09 NOTE — Anesthesia Postprocedure Evaluation (Signed)
Anesthesia Post Note  Patient: Kristen Aguilar  Procedure(s) Performed: COLONOSCOPY WITH PROPOFOL (N/A )  Patient location during evaluation: Endoscopy Anesthesia Type: General Level of consciousness: awake and alert Pain management: pain level controlled Vital Signs Assessment: post-procedure vital signs reviewed and stable Respiratory status: spontaneous breathing and respiratory function stable Cardiovascular status: stable Anesthetic complications: no     Last Vitals:  Vitals:   06/09/18 0840 06/09/18 0917  BP: (!) 174/86 120/68  Pulse: 90 96  Resp: 20 18  Temp: (!) 36.2 C (!) 36.1 C  SpO2: 98%     Last Pain:  Vitals:   06/09/18 0917  TempSrc: Tympanic  PainSc: 0-No pain                 KEPHART,WILLIAM K

## 2018-06-10 ENCOUNTER — Encounter: Payer: Self-pay | Admitting: Gastroenterology

## 2018-06-19 ENCOUNTER — Telehealth: Payer: Self-pay

## 2018-06-19 NOTE — Telephone Encounter (Signed)
Copied from CRM [id number]. 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   Fax# [phone] Cb# [phone]

## 2018-06-26 NOTE — Telephone Encounter (Signed)
Patient has picked up a copy of form. Company did not receive fax. Will refax notes and form

## 2018-07-17 ENCOUNTER — Ambulatory Visit: Payer: 59 | Admitting: Internal Medicine

## 2018-07-17 ENCOUNTER — Encounter: Payer: Self-pay | Admitting: Internal Medicine

## 2018-07-17 VITALS — BP 136/80 | HR 74 | Temp 98.5°F | Resp 18 | Wt 217.2 lb

## 2018-07-17 DIAGNOSIS — D649 Anemia, unspecified: Secondary | ICD-10-CM

## 2018-07-17 DIAGNOSIS — R739 Hyperglycemia, unspecified: Secondary | ICD-10-CM

## 2018-07-17 DIAGNOSIS — Z23 Encounter for immunization: Secondary | ICD-10-CM

## 2018-07-17 DIAGNOSIS — R7989 Other specified abnormal findings of blood chemistry: Secondary | ICD-10-CM | POA: Diagnosis not present

## 2018-07-17 DIAGNOSIS — Z6837 Body mass index (BMI) 37.0-37.9, adult: Secondary | ICD-10-CM | POA: Diagnosis not present

## 2018-07-17 DIAGNOSIS — E7800 Pure hypercholesterolemia, unspecified: Secondary | ICD-10-CM | POA: Diagnosis not present

## 2018-07-17 DIAGNOSIS — F439 Reaction to severe stress, unspecified: Secondary | ICD-10-CM

## 2018-07-17 MED ORDER — FLUTICASONE PROPIONATE 50 MCG/ACT NA SUSP: NASAL | 3 refills | Status: DC

## 2018-07-17 NOTE — Progress Notes (Signed)
Patient ID: Kristen Aguilar, female   DOB: [birth year], 52 y.o.   MRN: [id number]   Subjective:    Patient ID: Kristen Aguilar, female    DOB: 21-Jul-1966, 52 y.o.   MRN: [id number]  HPI  Patient here for a scheduled follow up.  She reports she is doing relatively well.  Feels better.  Feels handling stress better.  On celexa.  Doing well on this medication.  She is getting back into the gym.  Walking.  No chest pain.  No sob.  No acid reflux.  No abdominal pain.  Bowels moving.  No urine change.  Needs work form completed.  Discussed diet and exercise.  She does report a horsefly bit her just below her knee.  States she did not feel it bite, just saw it on her leg.  Site of bite healed.  No pain.  No redness.  Overall she feels she is doing well.     Past Medical History:  Diagnosis Date  . Allergy   . Anemia    as a child  . Anxiety   . Depression   . Gestational diabetes   . Hypertension   . Pre-diabetes    Past Surgical History:  Procedure Laterality Date  . CESAREAN SECTION  2001 & 2004  . COLONOSCOPY WITH PROPOFOL N/A 06/09/2018   Procedure: COLONOSCOPY WITH PROPOFOL;  Surgeon: Jonathon Bellows, MD;  [facility] ENDOSCOPY;  Service: Gastroenterology;  Laterality: N/A;   Family History  Problem Relation Age of Onset  . Hypertension Mother   . Hypertension Father   . Hypertension Maternal Grandmother   . Diabetes Maternal Grandmother   . Breast cancer Paternal Grandmother    Social History   Socioeconomic History  . Marital status: Married    Spouse name: Not on file  . Number of children: 2  . Years of education: Not on file  . Highest education level: Not on file  Occupational History  . Not on file  Social Needs  . Financial resource strain: Not on file  . Food insecurity:    Worry: Not on file    Inability: Not on file  . Transportation needs:    Medical: Not on file    Non-medical: Not on file  Tobacco Use  . Smoking status: Never Smoker  . Smokeless  tobacco: Never Used  Substance and Sexual Activity  . Alcohol use: No    Alcohol/week: 0.0 standard drinks  . Drug use: No  . Sexual activity: Not on file  Lifestyle  . Physical activity:    Days per week: Not on file    Minutes per session: Not on file  . Stress: Not on file  Relationships  . Social connections:    Talks on phone: Not on file    Gets together: Not on file    Attends religious service: Not on file    Active member of club or organization: Not on file    Attends meetings of clubs or organizations: Not on file    Relationship status: Not on file  Other Topics Concern  . Not on file  Social History Narrative  . Not on file    Outpatient Encounter Medications as of 07/17/2018  Medication Sig  . GARLIC PO Take by mouth.  . Multiple Vitamin (MULTIVITAMIN) tablet Take 1 tablet by mouth daily.  . cetirizine (ZYRTEC) 10 MG tablet Take 10 mg by mouth daily.  . citalopram (CELEXA) 20 MG tablet Take 1 1/2 tablet per  day  . Coenzyme Q10 (CO Q 10) 100 MG CAPS Take 50 mg by mouth daily.   . fish oil-omega-3 fatty acids 1000 MG capsule Take 2 g by mouth daily.  . fluticasone (FLONASE) 50 MCG/ACT nasal spray instill 2 sprays into each nostril once daily  . lisinopril (PRINIVIL,ZESTRIL) 20 MG tablet TAKE 1 TABLET BY MOUTH  DAILY  . Multiple Vitamin (MULTIVITAMIN WITH MINERALS) TABS Take 1 tablet by mouth daily.  Marland Kitchen nystatin (MYCOSTATIN) 100000 UNIT/ML suspension Take 5 mLs (500,000 Units total) by mouth 3 (three) times daily as needed. Swish and spit  . pravastatin (PRAVACHOL) 40 MG tablet Take 1 tablet (40 mg total) by mouth daily.  . [DISCONTINUED] Cholecalciferol (VITAMIN D-3 PO) Take by mouth.  . [DISCONTINUED] fluticasone (FLONASE) 50 MCG/ACT nasal spray instill 2 sprays into each nostril once daily   No facility-administered encounter medications on file as of 07/17/2018.     Review of Systems  Constitutional: Negative for appetite change and unexpected weight change.    HENT: Negative for congestion and sinus pressure.   Respiratory: Negative for cough, chest tightness and shortness of breath.   Cardiovascular: Negative for chest pain, palpitations and leg swelling.  Gastrointestinal: Negative for abdominal pain, diarrhea, nausea and vomiting.  Genitourinary: Negative for difficulty urinating and dysuria.  Musculoskeletal: Negative for joint swelling and myalgias.  Skin: Negative for color change and rash.  Neurological: Negative for dizziness, light-headedness and headaches.  Psychiatric/Behavioral: Negative for agitation and dysphoric mood.       Objective:    Physical Exam  Constitutional: She appears well-developed and well-nourished. No distress.  HENT:  Nose: Nose normal.  Mouth/Throat: Oropharynx is clear and moist.  Neck: Neck supple. No thyromegaly present.  Cardiovascular: Normal rate and regular rhythm.  Pulmonary/Chest: Breath sounds normal. No respiratory distress. She has no wheezes.  Abdominal: Soft. Bowel sounds are normal. There is no tenderness.  Musculoskeletal: She exhibits no edema or tenderness.  Lymphadenopathy:    She has no cervical adenopathy.  Skin: No rash noted. No erythema.  Psychiatric: She has a normal mood and affect. Her behavior is normal.    BP 136/80 (BP Location: Left Arm, Patient Position: Sitting, Cuff Size: Large)   Pulse 74   Temp 98.5 F (36.9 C) (Oral)   Resp 18   Wt 217 lb 3.2 oz (98.5 kg)   SpO2 96%   BMI 37.28 kg/m  Wt Readings from Last 3 Encounters:  07/17/18 217 lb 3.2 oz (98.5 kg)  06/09/18 215 lb (97.5 kg)  05/20/18 217 lb (98.4 kg)     Lab Results  Component Value Date   WBC 9.1 01/14/2018   HGB 14.3 01/14/2018   HCT 43.6 01/14/2018   PLT 337 01/14/2018   GLUCOSE 104 (H) 05/16/2018   CHOL 184 05/16/2018   TRIG 156 (H) 05/16/2018   HDL 52 05/16/2018   LDLDIRECT 135.3 07/20/2014   LDLCALC 101 (H) 05/16/2018   ALT 22 05/16/2018   AST 20 05/16/2018   NA 140 05/16/2018   K  5.1 05/16/2018   CL 101 05/16/2018   CREATININE 0.83 05/16/2018   BUN 14 05/16/2018   CO2 23 05/16/2018   TSH 5.060 (H) 07/17/2018   HGBA1C 6.2 (H) 05/16/2018       Assessment & Plan:   Problem List Items Addressed This Visit    Anemia    Follow cbc.       BMI 37.0-37.9, adult    Discussed diet and exercise.  Follow.  Hypercholesterolemia    On pravastatin.  Low cholesterol diet and exercise.  Follow lipid panel and liver function tests.        Hyperglycemia    Low carb diet and exercise.  Follow met b and a1c.        Stress    On citalopram.  Doing well on current regimen.  Feels better.  Follow.         Other Visit Diagnoses    Elevated TSH    -  Primary   elevated tsh.  recheck tsh today.     Relevant Orders   TSH (Completed)   Need for immunization against influenza       Relevant Orders   Flu Vaccine QUAD 36+ mos IM (Completed)       Einar Pheasant, MD

## 2018-07-18 ENCOUNTER — Encounter: Payer: Self-pay | Admitting: Internal Medicine

## 2018-07-18 LAB — TSH: TSH: 5.06 u[IU]/mL — ABNORMAL HIGH (ref 0.450–4.500)

## 2018-07-20 ENCOUNTER — Encounter: Payer: Self-pay | Admitting: Internal Medicine

## 2018-07-20 NOTE — Assessment & Plan Note (Signed)
On pravastatin.  Low cholesterol diet and exercise.  Follow lipid panel and liver function tests.   

## 2018-07-20 NOTE — Assessment & Plan Note (Signed)
Discussed diet and exercise.  Follow.  

## 2018-07-20 NOTE — Assessment & Plan Note (Signed)
Follow cbc.  

## 2018-07-20 NOTE — Assessment & Plan Note (Signed)
Low carb diet and exercise.  Follow met b and a1c.   

## 2018-07-20 NOTE — Assessment & Plan Note (Signed)
On citalopram.  Doing well on current regimen.  Feels better.  Follow.

## 2018-08-10 ENCOUNTER — Other Ambulatory Visit: Payer: Self-pay | Admitting: Internal Medicine

## 2018-08-16 ENCOUNTER — Other Ambulatory Visit: Payer: Self-pay | Admitting: Internal Medicine

## 2018-09-18 ENCOUNTER — Ambulatory Visit: Payer: 59 | Admitting: Internal Medicine

## 2018-09-18 DIAGNOSIS — F439 Reaction to severe stress, unspecified: Secondary | ICD-10-CM

## 2018-09-18 DIAGNOSIS — E7800 Pure hypercholesterolemia, unspecified: Secondary | ICD-10-CM

## 2018-09-18 DIAGNOSIS — R739 Hyperglycemia, unspecified: Secondary | ICD-10-CM | POA: Diagnosis not present

## 2018-09-18 DIAGNOSIS — Z6837 Body mass index (BMI) 37.0-37.9, adult: Secondary | ICD-10-CM

## 2018-09-18 DIAGNOSIS — D649 Anemia, unspecified: Secondary | ICD-10-CM

## 2018-09-18 DIAGNOSIS — I10 Essential (primary) hypertension: Secondary | ICD-10-CM

## 2018-09-18 NOTE — Progress Notes (Signed)
Patient ID: Kristen Aguilar, female   DOB: August 19, 1966, 52 y.o.   MRN: [id number]   Subjective:    Patient ID: Kristen Aguilar, female    DOB: [birth year], 52 y.o.   MRN: [id number]  HPI  Patient here for a scheduled follow up.  She reports she is doing better.  Feels better. Feels medication is working well.  Trying to stay active.  No chest pain.  No sob.  No acid reflux.  No abdominal pain.  Bowels moving.  No urine change. Going to the gym.  Overall feels better.     Past Medical History:  Diagnosis Date  . Allergy   . Anemia    as a child  . Anxiety   . Depression   . Gestational diabetes   . Hypertension   . Pre-diabetes    Past Surgical History:  Procedure Laterality Date  . CESAREAN SECTION  2001 & 2004  . COLONOSCOPY WITH PROPOFOL N/A 06/09/2018   Procedure: COLONOSCOPY WITH PROPOFOL;  Surgeon: Jonathon Bellows, MD;  Location: [facility] ENDOSCOPY;  Service: Gastroenterology;  Laterality: N/A;   Family History  Problem Relation Age of Onset  . Hypertension Mother   . Hypertension Father   . Hypertension Maternal Grandmother   . Diabetes Maternal Grandmother   . Breast cancer Paternal Grandmother    Social History   Socioeconomic History  . Marital status: Married    Spouse name: Not on file  . Number of children: 2  . Years of education: Not on file  . Highest education level: Not on file  Occupational History  . Not on file  Social Needs  . Financial resource strain: Not on file  . Food insecurity:    Worry: Not on file    Inability: Not on file  . Transportation needs:    Medical: Not on file    Non-medical: Not on file  Tobacco Use  . Smoking status: Never Smoker  . Smokeless tobacco: Never Used  Substance and Sexual Activity  . Alcohol use: No    Alcohol/week: 0.0 standard drinks  . Drug use: No  . Sexual activity: Not on file  Lifestyle  . Physical activity:    Days per week: Not on file    Minutes per session: Not on file  . Stress: Not on file    Relationships  . Social connections:    Talks on phone: Not on file    Gets together: Not on file    Attends religious service: Not on file    Active member of club or organization: Not on file    Attends meetings of clubs or organizations: Not on file    Relationship status: Not on file  Other Topics Concern  . Not on file  Social History Narrative  . Not on file    Outpatient Encounter Medications as of 09/18/2018  Medication Sig  . cetirizine (ZYRTEC) 10 MG tablet Take 10 mg by mouth daily.  . citalopram (CELEXA) 20 MG tablet Take 1 1/2 tablet per day  . Coenzyme Q10 (CO Q 10) 100 MG CAPS Take 50 mg by mouth daily.   . fish oil-omega-3 fatty acids 1000 MG capsule Take 2 g by mouth daily.  . fluticasone (FLONASE) 50 MCG/ACT nasal spray instill 2 sprays into each nostril once daily  . GARLIC PO Take by mouth.  Marland Kitchen lisinopril (PRINIVIL,ZESTRIL) 20 MG tablet TAKE 1 TABLET BY MOUTH  DAILY  . Multiple Vitamin (MULTIVITAMIN WITH MINERALS) TABS Take 1  tablet by mouth daily.  . Multiple Vitamin (MULTIVITAMIN) tablet Take 1 tablet by mouth daily.  Marland Kitchen nystatin (MYCOSTATIN) 100000 UNIT/ML suspension Take 5 mLs (500,000 Units total) by mouth 3 (three) times daily as needed. Swish and spit  . pravastatin (PRAVACHOL) 40 MG tablet Take 1 tablet (40 mg total) by mouth daily.   No facility-administered encounter medications on file as of 09/18/2018.     Review of Systems  Constitutional: Negative for appetite change and unexpected weight change.  HENT: Negative for congestion and sinus pressure.   Respiratory: Negative for cough, chest tightness and shortness of breath.   Cardiovascular: Negative for chest pain, palpitations and leg swelling.  Gastrointestinal: Negative for abdominal pain, diarrhea, nausea and vomiting.  Genitourinary: Negative for difficulty urinating and dysuria.  Musculoskeletal: Negative for joint swelling and myalgias.  Skin: Negative for color change and rash.   Neurological: Negative for dizziness, light-headedness and headaches.  Psychiatric/Behavioral: Negative for agitation and dysphoric mood.       Objective:    Physical Exam  Constitutional: She appears well-developed and well-nourished. No distress.  HENT:  Nose: Nose normal.  Mouth/Throat: Oropharynx is clear and moist.  Neck: Neck supple. No thyromegaly present.  Cardiovascular: Normal rate and regular rhythm.  Pulmonary/Chest: Breath sounds normal. No respiratory distress. She has no wheezes.  Abdominal: Soft. Bowel sounds are normal. There is no tenderness.  Musculoskeletal: She exhibits no edema or tenderness.  Lymphadenopathy:    She has no cervical adenopathy.  Skin: No rash noted. No erythema.  Psychiatric: She has a normal mood and affect. Her behavior is normal.    BP 132/82   Pulse 73   Temp 98.3 F (36.8 C) (Oral)   Resp 16   Wt 219 lb 6.4 oz (99.5 kg)   SpO2 98%   BMI 37.66 kg/m  Wt Readings from Last 3 Encounters:  09/18/18 219 lb 6.4 oz (99.5 kg)  07/17/18 217 lb 3.2 oz (98.5 kg)  06/09/18 215 lb (97.5 kg)     Lab Results  Component Value Date   WBC 9.1 01/14/2018   HGB 14.3 01/14/2018   HCT 43.6 01/14/2018   PLT 337 01/14/2018   GLUCOSE 104 (H) 05/16/2018   CHOL 184 05/16/2018   TRIG 156 (H) 05/16/2018   HDL 52 05/16/2018   LDLDIRECT 135.3 07/20/2014   LDLCALC 101 (H) 05/16/2018   ALT 22 05/16/2018   AST 20 05/16/2018   NA 140 05/16/2018   K 5.1 05/16/2018   CL 101 05/16/2018   CREATININE 0.83 05/16/2018   BUN 14 05/16/2018   CO2 23 05/16/2018   TSH 5.060 (H) 07/17/2018   HGBA1C 6.2 (H) 05/16/2018       Assessment & Plan:   Problem List Items Addressed This Visit    Anemia    Follow cbc.        BMI 37.0-37.9, adult    Discussed diet and exercise.  Follow.       Hypercholesterolemia    On pravastatin.  Low cholesterol diet and exercise.  Follow lipid panel and liver function tests.        Relevant Orders   Hepatic  function panel   Lipid panel   TSH   Hyperglycemia    Low carb diet and exercise.  Follow met b and a1c.        Relevant Orders   Hemoglobin Y7C   Basic metabolic panel   Hypertension    Blood pressure on recheck improved.  Same medication  regimen.  Follow pressures.  Follow metabolic panel.        Stress    Discussed with her today.  Overall doing well on celexa.  Follow.            Einar Pheasant, MD

## 2018-09-20 ENCOUNTER — Encounter: Payer: Self-pay | Admitting: Internal Medicine

## 2018-09-20 DIAGNOSIS — I10 Essential (primary) hypertension: Secondary | ICD-10-CM | POA: Insufficient documentation

## 2018-09-20 NOTE — Assessment & Plan Note (Signed)
Discussed diet and exercise.  Follow.  

## 2018-09-20 NOTE — Assessment & Plan Note (Signed)
Discussed with her today.  Overall doing well on celexa.  Follow.

## 2018-09-20 NOTE — Assessment & Plan Note (Signed)
Follow cbc.  

## 2018-09-20 NOTE — Assessment & Plan Note (Signed)
On pravastatin.  Low cholesterol diet and exercise.  Follow lipid panel and liver function tests.   

## 2018-09-20 NOTE — Assessment & Plan Note (Signed)
Blood pressure on recheck improved.  Same medication regimen.  Follow pressures.  Follow metabolic panel.   

## 2018-09-20 NOTE — Assessment & Plan Note (Signed)
Low carb diet and exercise.  Follow met b and a1c.

## 2018-10-02 ENCOUNTER — Other Ambulatory Visit (INDEPENDENT_AMBULATORY_CARE_PROVIDER_SITE_OTHER): Payer: 59

## 2018-10-02 DIAGNOSIS — R739 Hyperglycemia, unspecified: Secondary | ICD-10-CM | POA: Diagnosis not present

## 2018-10-02 DIAGNOSIS — E7800 Pure hypercholesterolemia, unspecified: Secondary | ICD-10-CM | POA: Diagnosis not present

## 2018-10-03 LAB — HEPATIC FUNCTION PANEL
ALT: 28 IU/L (ref 0–32)
AST: 24 IU/L (ref 0–40)
Albumin: 4.6 g/dL (ref 3.5–5.5)
Alkaline Phosphatase: 91 IU/L (ref 39–117)
Bilirubin Total: 0.5 mg/dL (ref 0.0–1.2)
Bilirubin, Direct: 0.15 mg/dL (ref 0.00–0.40)
Total Protein: 6.9 g/dL (ref 6.0–8.5)

## 2018-10-03 LAB — BASIC METABOLIC PANEL
BUN/Creatinine Ratio: 16 (ref 9–23)
BUN: 13 mg/dL (ref 6–24)
CALCIUM: 9.6 mg/dL (ref 8.7–10.2)
CHLORIDE: 101 mmol/L (ref 96–106)
CO2: 23 mmol/L (ref 20–29)
Creatinine, Ser: 0.81 mg/dL (ref 0.57–1.00)
GFR calc Af Amer: 96 mL/min/{1.73_m2} (ref >59)
GFR calc non Af Amer: 83 mL/min/{1.73_m2} (ref >59)
GLUCOSE: 113 mg/dL — AB (ref 65–99)
POTASSIUM: 4.6 mmol/L (ref 3.5–5.2)
Sodium: 143 mmol/L (ref 134–144)

## 2018-10-03 LAB — HEMOGLOBIN A1C
Est. average glucose Bld gHb Est-mCnc: 131 mg/dL
Hgb A1c MFr Bld: 6.2 % — ABNORMAL HIGH (ref 4.8–5.6)

## 2018-10-03 LAB — LIPID PANEL
CHOLESTEROL TOTAL: 179 mg/dL (ref 100–199)
Chol/HDL Ratio: 3.4 ratio (ref 0.0–4.4)
HDL: 52 mg/dL (ref >39)
LDL Calculated: 100 mg/dL — ABNORMAL HIGH (ref 0–99)
Triglycerides: 134 mg/dL (ref 0–149)
VLDL Cholesterol Cal: 27 mg/dL (ref 5–40)

## 2018-10-03 LAB — TSH: TSH: 5.85 u[IU]/mL — AB (ref 0.450–4.500)

## 2018-10-06 MED ORDER — LEVOTHYROXINE SODIUM 50 MCG PO TABS: 50.0000 ug | ORAL_TABLET | Freq: Every day | ORAL | 3 refills | Status: DC

## 2018-10-06 MED ORDER — FLUTICASONE PROPIONATE 50 MCG/ACT NA SUSP: NASAL | 3 refills | Status: DC

## 2018-10-06 NOTE — Addendum Note (Signed)
Addended by: Dennie BibleAVIS, KATHY R on: 10/06/2018 03:47 PM   Modules accepted: Orders

## 2018-10-07 ENCOUNTER — Other Ambulatory Visit: Payer: Self-pay | Admitting: Internal Medicine

## 2018-10-10 ENCOUNTER — Other Ambulatory Visit: Payer: Self-pay | Admitting: Internal Medicine

## 2018-11-27 ENCOUNTER — Telehealth: Payer: Self-pay | Admitting: Radiology

## 2018-11-27 ENCOUNTER — Other Ambulatory Visit: Payer: Self-pay | Admitting: Internal Medicine

## 2018-11-27 DIAGNOSIS — E039 Hypothyroidism, unspecified: Secondary | ICD-10-CM

## 2018-11-27 NOTE — Telephone Encounter (Signed)
Order placed for f/u tsh.  

## 2018-11-27 NOTE — Progress Notes (Signed)
Order placed for f/u tsh.  

## 2018-11-27 NOTE — Telephone Encounter (Signed)
Pt coming in for labs tomorrow, please place future orders. Thank you.  

## 2018-11-28 ENCOUNTER — Other Ambulatory Visit (INDEPENDENT_AMBULATORY_CARE_PROVIDER_SITE_OTHER): Payer: 59

## 2018-11-28 DIAGNOSIS — E039 Hypothyroidism, unspecified: Secondary | ICD-10-CM | POA: Diagnosis not present

## 2018-11-28 NOTE — Addendum Note (Signed)
Addended by: Penne Lash on: 11/28/2018 01:55 PM   Modules accepted: Orders

## 2018-11-29 ENCOUNTER — Encounter: Payer: Self-pay | Admitting: Internal Medicine

## 2018-11-29 LAB — TSH: TSH: 2.94 u[IU]/mL (ref 0.450–4.500)

## 2018-12-22 ENCOUNTER — Other Ambulatory Visit: Payer: Self-pay | Admitting: Internal Medicine

## 2019-01-20 ENCOUNTER — Encounter: Payer: 59 | Admitting: Internal Medicine

## 2019-01-30 ENCOUNTER — Encounter: Payer: Self-pay | Admitting: *Deleted

## 2019-01-31 ENCOUNTER — Other Ambulatory Visit: Payer: Self-pay | Admitting: Internal Medicine

## 2019-03-15 ENCOUNTER — Other Ambulatory Visit: Payer: Self-pay | Admitting: Internal Medicine

## 2019-06-18 ENCOUNTER — Ambulatory Visit (INDEPENDENT_AMBULATORY_CARE_PROVIDER_SITE_OTHER): Payer: Managed Care, Other (non HMO) | Admitting: Internal Medicine

## 2019-06-18 ENCOUNTER — Other Ambulatory Visit: Payer: Self-pay

## 2019-06-18 ENCOUNTER — Encounter: Payer: Self-pay | Admitting: Internal Medicine

## 2019-06-18 VITALS — BP 136/78 | HR 75 | Temp 98.3°F | Resp 16 | Ht 64.0 in | Wt 222.8 lb

## 2019-06-18 DIAGNOSIS — Z23 Encounter for immunization: Secondary | ICD-10-CM

## 2019-06-18 DIAGNOSIS — F439 Reaction to severe stress, unspecified: Secondary | ICD-10-CM

## 2019-06-18 DIAGNOSIS — E7800 Pure hypercholesterolemia, unspecified: Secondary | ICD-10-CM | POA: Diagnosis not present

## 2019-06-18 DIAGNOSIS — I10 Essential (primary) hypertension: Secondary | ICD-10-CM

## 2019-06-18 DIAGNOSIS — D649 Anemia, unspecified: Secondary | ICD-10-CM

## 2019-06-18 DIAGNOSIS — R739 Hyperglycemia, unspecified: Secondary | ICD-10-CM

## 2019-06-18 DIAGNOSIS — Z0000 Encounter for general adult medical examination without abnormal findings: Secondary | ICD-10-CM | POA: Diagnosis not present

## 2019-06-18 DIAGNOSIS — Z8632 Personal history of gestational diabetes: Secondary | ICD-10-CM

## 2019-06-18 DIAGNOSIS — Z124 Encounter for screening for malignant neoplasm of cervix: Secondary | ICD-10-CM

## 2019-06-18 NOTE — Assessment & Plan Note (Addendum)
Physical today 06/18/19.  PAP 06/18/19.  Schedule mammogram.  Colonoscopy 06/09/18 - recommended f/u in 10 years.

## 2019-06-18 NOTE — Progress Notes (Signed)
Patient ID: Kristen Aguilar, female   DOB: 1965/12/21, 53 y.o.   MRN: [id number]   Subjective:    Patient ID: Kristen Aguilar, female    DOB: 1965/12/25, 53 y.o.   MRN: [id number]  HPI  Patient here for her physical exam. She reports increased stress recently.  Her son is at home and her husband has been working from home.  They are up late.  This interrupts her sleep.  Discussed the increased stress.  She does not feel needs anything more at this time.  No chest pain.  No sob.  No acid reflux.  No abdominal pain.  Bowels moving.  Had colonoscopy 05/2018.  Recommended f/u in 10 years.     Past Medical History:  Diagnosis Date  . Allergy   . Anemia    as a child  . Anxiety   . Depression   . Gestational diabetes   . Hypertension   . Pre-diabetes    Past Surgical History:  Procedure Laterality Date  . CESAREAN SECTION  2001 & 2004  . COLONOSCOPY WITH PROPOFOL N/A 06/09/2018   Procedure: COLONOSCOPY WITH PROPOFOL;  Surgeon: Jonathon Bellows, MD;  Location: [facility] ENDOSCOPY;  Service: Gastroenterology;  Laterality: N/A;   Family History  Problem Relation Age of Onset  . Hypertension Mother   . Hypertension Father   . Hypertension Maternal Grandmother   . Diabetes Maternal Grandmother   . Breast cancer Paternal Grandmother    Social History   Socioeconomic History  . Marital status: Married    Spouse name: Not on file  . Number of children: 2  . Years of education: Not on file  . Highest education level: Not on file  Occupational History  . Not on file  Social Needs  . Financial resource strain: Not on file  . Food insecurity    Worry: Not on file    Inability: Not on file  . Transportation needs    Medical: Not on file    Non-medical: Not on file  Tobacco Use  . Smoking status: Never Smoker  . Smokeless tobacco: Never Used  Substance and Sexual Activity  . Alcohol use: No    Alcohol/week: 0.0 standard drinks  . Drug use: No  . Sexual activity: Not on file  Lifestyle   . Physical activity    Days per week: Not on file    Minutes per session: Not on file  . Stress: Not on file  Relationships  . Social Herbalist on phone: Not on file    Gets together: Not on file    Attends religious service: Not on file    Active member of club or organization: Not on file    Attends meetings of clubs or organizations: Not on file    Relationship status: Not on file  Other Topics Concern  . Not on file  Social History Narrative  . Not on file    Outpatient Encounter Medications as of 06/18/2019  Medication Sig  . cetirizine (ZYRTEC) 10 MG tablet Take 10 mg by mouth daily.  . citalopram (CELEXA) 20 MG tablet TAKE 1 AND 1/2 TABLETS BY  MOUTH PER DAY  . Coenzyme Q10 (CO Q 10) 100 MG CAPS Take 50 mg by mouth daily.   . fish oil-omega-3 fatty acids 1000 MG capsule Take 2 g by mouth daily.  . fluticasone (FLONASE) 50 MCG/ACT nasal spray instill 2 sprays into each nostril once daily  . GARLIC PO Take by  mouth.  . levothyroxine (SYNTHROID, LEVOTHROID) 50 MCG tablet Take 1 tablet (50 mcg total) by mouth daily before breakfast.  . lisinopril (PRINIVIL,ZESTRIL) 20 MG tablet TAKE 1 TABLET BY MOUTH  DAILY  . Multiple Vitamin (MULTIVITAMIN WITH MINERALS) TABS Take 1 tablet by mouth daily.  . Multiple Vitamin (MULTIVITAMIN) tablet Take 1 tablet by mouth daily.  Marland Kitchen nystatin (MYCOSTATIN) 100000 UNIT/ML suspension Take 5 mLs (500,000 Units total) by mouth 3 (three) times daily as needed. Swish and spit  . pravastatin (PRAVACHOL) 40 MG tablet TAKE 1 TABLET BY MOUTH  DAILY   No facility-administered encounter medications on file as of 06/18/2019.     Review of Systems  Constitutional: Negative for appetite change and unexpected weight change.  HENT: Negative for congestion and sinus pressure.   Eyes: Negative for pain and visual disturbance.  Respiratory: Negative for cough, chest tightness and shortness of breath.   Cardiovascular: Negative for chest pain, palpitations  and leg swelling.  Gastrointestinal: Negative for abdominal pain, diarrhea, nausea and vomiting.  Genitourinary: Negative for difficulty urinating and dysuria.  Musculoskeletal: Negative for joint swelling and myalgias.  Skin: Negative for color change and rash.  Neurological: Negative for dizziness, light-headedness and headaches.  Hematological: Negative for adenopathy. Does not bruise/bleed easily.  Psychiatric/Behavioral: Negative for agitation and dysphoric mood.       Increased stress as outlined.         Objective:    Physical Exam Constitutional:      General: She is not in acute distress.    Appearance: Normal appearance. She is well-developed.  HENT:     Right Ear: External ear normal.     Left Ear: External ear normal.  Eyes:     General: No scleral icterus.       Right eye: No discharge.        Left eye: No discharge.     Conjunctiva/sclera: Conjunctivae normal.  Neck:     Musculoskeletal: Neck supple. No muscular tenderness.     Thyroid: No thyromegaly.  Cardiovascular:     Rate and Rhythm: Normal rate and regular rhythm.  Pulmonary:     Effort: No tachypnea, accessory muscle usage or respiratory distress.     Breath sounds: Normal breath sounds. No decreased breath sounds or wheezing.  Chest:     Breasts:        Right: No inverted nipple, mass, nipple discharge or tenderness (no axillary adenopathy).        Left: No inverted nipple, mass, nipple discharge or tenderness (no axilarry adenopathy).  Abdominal:     General: Bowel sounds are normal.     Palpations: Abdomen is soft.     Tenderness: There is no abdominal tenderness.  Genitourinary:    Comments: Normal external genitalia.  Vaginal vault without lesions.  Cervix identified.  Pap smear performed.  Could not appreciate any adnexal masses or tenderness.   Musculoskeletal:        General: No swelling or tenderness.  Lymphadenopathy:     Cervical: No cervical adenopathy.  Skin:    Findings: No erythema  or rash.  Neurological:     Mental Status: She is alert and oriented to person, place, and time.  Psychiatric:        Mood and Affect: Mood normal.        Behavior: Behavior normal.     BP 136/78   Pulse 75   Temp 98.3 F (36.8 C)   Resp 16   Ht '5\' 4"'  (  1.626 m)   Wt 222 lb 12.8 oz (101.1 kg)   SpO2 97%   BMI 38.24 kg/m  Wt Readings from Last 3 Encounters:  06/18/19 222 lb 12.8 oz (101.1 kg)  09/18/18 219 lb 6.4 oz (99.5 kg)  07/17/18 217 lb 3.2 oz (98.5 kg)     Lab Results  Component Value Date   WBC 7.9 06/18/2019   HGB 14.0 06/18/2019   HCT 40.9 06/18/2019   PLT 328 06/18/2019   GLUCOSE 99 06/18/2019   CHOL 168 06/18/2019   TRIG 136 06/18/2019   HDL 50 06/18/2019   LDLDIRECT 135.3 07/20/2014   LDLCALC 100 (H) 10/02/2018   ALT 34 (H) 06/18/2019   AST 34 06/18/2019   NA 140 06/18/2019   K 4.4 06/18/2019   CL 101 06/18/2019   CREATININE 0.92 06/18/2019   BUN 11 06/18/2019   CO2 22 06/18/2019   TSH 3.670 06/18/2019   HGBA1C 6.0 (H) 06/18/2019       Assessment & Plan:   Problem List Items Addressed This Visit    Anemia    Follow cbc.        Health care maintenance    Physical today 06/18/19.  PAP 06/18/19.  Schedule mammogram.  Colonoscopy 06/09/18 - recommended f/u in 10 years.        History of gestational diabetes    Follow met b and a1c.        Hypercholesterolemia    On pravastatin.  Low cholesterol diet and exercise.  Follow lipid panel and liver function tests.        Relevant Orders   Lipid Profile (Completed)   Hepatic function panel (Completed)   Hyperglycemia    Low carb diet and exercise.  Follow metb and a1c.        Relevant Orders   HgB A1c (Completed)   Hypertension - Primary    Blood pressure under good control.  Continue same medication regimen.  Follow pressures.  Follow metabolic panel.        Relevant Orders   Basic Metabolic Panel (BMET) (Completed)   TSH (Completed)   CBC w/Diff (Completed)   Stress    Increased  stress as outlined.  Discussed with her today.  She feels she is doing well on citalopram.  Does not feel needs any further intervention.  Follow.         Other Visit Diagnoses    Cervical cancer screening       Relevant Orders   Pap liquid-based and HPV (high risk)   Need for immunization against influenza       Relevant Orders   Flu Vaccine QUAD 36+ mos IM (Completed)       Einar Pheasant, MD

## 2019-06-19 LAB — HEPATIC FUNCTION PANEL
ALT: 34 IU/L — ABNORMAL HIGH (ref 0–32)
AST: 34 IU/L (ref 0–40)
Albumin: 4.5 g/dL (ref 3.8–4.9)
Alkaline Phosphatase: 88 IU/L (ref 39–117)
Bilirubin Total: 0.5 mg/dL (ref 0.0–1.2)
Bilirubin, Direct: 0.15 mg/dL (ref 0.00–0.40)
Total Protein: 6.9 g/dL (ref 6.0–8.5)

## 2019-06-19 LAB — CBC WITH DIFFERENTIAL/PLATELET
Basophils Absolute: 0.1 10*3/uL (ref 0.0–0.2)
Basos: 1 %
EOS (ABSOLUTE): 0.2 10*3/uL (ref 0.0–0.4)
Eos: 3 %
Hematocrit: 40.9 % (ref 34.0–46.6)
Hemoglobin: 14 g/dL (ref 11.1–15.9)
Immature Grans (Abs): 0 10*3/uL (ref 0.0–0.1)
Immature Granulocytes: 0 %
Lymphocytes Absolute: 2.2 10*3/uL (ref 0.7–3.1)
Lymphs: 28 %
MCH: 30.4 pg (ref 26.6–33.0)
MCHC: 34.2 g/dL (ref 31.5–35.7)
MCV: 89 fL (ref 79–97)
Monocytes Absolute: 0.4 10*3/uL (ref 0.1–0.9)
Monocytes: 6 %
Neutrophils Absolute: 4.9 10*3/uL (ref 1.4–7.0)
Neutrophils: 62 %
Platelets: 328 10*3/uL (ref 150–450)
RBC: 4.61 x10E6/uL (ref 3.77–5.28)
RDW: 12.5 % (ref 11.7–15.4)
WBC: 7.9 10*3/uL (ref 3.4–10.8)

## 2019-06-19 LAB — BASIC METABOLIC PANEL
BUN/Creatinine Ratio: 12 (ref 9–23)
BUN: 11 mg/dL (ref 6–24)
CO2: 22 mmol/L (ref 20–29)
Calcium: 9.3 mg/dL (ref 8.7–10.2)
Chloride: 101 mmol/L (ref 96–106)
Creatinine, Ser: 0.92 mg/dL (ref 0.57–1.00)
GFR calc Af Amer: 82 mL/min/{1.73_m2} (ref >59)
GFR calc non Af Amer: 71 mL/min/{1.73_m2} (ref >59)
Glucose: 99 mg/dL (ref 65–99)
Potassium: 4.4 mmol/L (ref 3.5–5.2)
Sodium: 140 mmol/L (ref 134–144)

## 2019-06-19 LAB — LIPID PANEL
Chol/HDL Ratio: 3.4 ratio (ref 0.0–4.4)
Cholesterol, Total: 168 mg/dL (ref 100–199)
HDL: 50 mg/dL (ref >39)
LDL Chol Calc (NIH): 94 mg/dL (ref 0–99)
Triglycerides: 136 mg/dL (ref 0–149)
VLDL Cholesterol Cal: 24 mg/dL (ref 5–40)

## 2019-06-19 LAB — HEMOGLOBIN A1C
Est. average glucose Bld gHb Est-mCnc: 126 mg/dL
Hgb A1c MFr Bld: 6 % — ABNORMAL HIGH (ref 4.8–5.6)

## 2019-06-19 LAB — TSH: TSH: 3.67 u[IU]/mL (ref 0.450–4.500)

## 2019-06-22 ENCOUNTER — Encounter: Payer: Self-pay | Admitting: Internal Medicine

## 2019-06-22 NOTE — Assessment & Plan Note (Signed)
Low carb diet and exercise.  Follow met b and a1c.  

## 2019-06-22 NOTE — Assessment & Plan Note (Signed)
Increased stress as outlined.  Discussed with her today.  She feels she is doing well on citalopram.  Does not feel needs any further intervention.  Follow.

## 2019-06-22 NOTE — Assessment & Plan Note (Signed)
Blood pressure under good control.  Continue same medication regimen.  Follow pressures.  Follow metabolic panel.   

## 2019-06-22 NOTE — Assessment & Plan Note (Signed)
Follow met b and a1c.

## 2019-06-22 NOTE — Assessment & Plan Note (Signed)
Follow cbc.  

## 2019-06-22 NOTE — Assessment & Plan Note (Signed)
On pravastatin.  Low cholesterol diet and exercise.  Follow lipid panel and liver function tests.   

## 2019-06-24 ENCOUNTER — Encounter: Payer: Self-pay | Admitting: Internal Medicine

## 2019-06-24 ENCOUNTER — Other Ambulatory Visit: Payer: Self-pay | Admitting: Internal Medicine

## 2019-06-24 DIAGNOSIS — R945 Abnormal results of liver function studies: Secondary | ICD-10-CM

## 2019-06-24 DIAGNOSIS — R7989 Other specified abnormal findings of blood chemistry: Secondary | ICD-10-CM

## 2019-06-24 LAB — PAP LB AND HPV HIGH-RISK: HPV, high-risk: NEGATIVE

## 2019-06-24 LAB — HM MAMMOGRAPHY

## 2019-06-24 NOTE — Progress Notes (Signed)
Order placed for f/u liver panel.  

## 2019-06-26 ENCOUNTER — Telehealth: Payer: Self-pay | Admitting: Internal Medicine

## 2019-06-26 NOTE — Telephone Encounter (Signed)
Patient states that Dr. Lars Mage nurse called her. She is returning the call. Please advise Cb- [phone]

## 2019-07-01 NOTE — Telephone Encounter (Signed)
See result note.  

## 2019-07-23 ENCOUNTER — Encounter: Payer: Self-pay | Admitting: Internal Medicine

## 2019-08-02 ENCOUNTER — Other Ambulatory Visit: Payer: Self-pay | Admitting: Internal Medicine

## 2019-08-03 ENCOUNTER — Other Ambulatory Visit (INDEPENDENT_AMBULATORY_CARE_PROVIDER_SITE_OTHER): Payer: Managed Care, Other (non HMO)

## 2019-08-03 ENCOUNTER — Other Ambulatory Visit: Payer: Self-pay

## 2019-08-03 DIAGNOSIS — R945 Abnormal results of liver function studies: Secondary | ICD-10-CM

## 2019-08-03 DIAGNOSIS — R7989 Other specified abnormal findings of blood chemistry: Secondary | ICD-10-CM

## 2019-08-03 NOTE — Addendum Note (Signed)
Addended by: Leeanne Rio on: 08/03/2019 09:19 AM   Modules accepted: Orders

## 2019-08-04 ENCOUNTER — Other Ambulatory Visit: Payer: Self-pay | Admitting: Internal Medicine

## 2019-08-04 ENCOUNTER — Encounter: Payer: Self-pay | Admitting: Internal Medicine

## 2019-08-04 LAB — HEPATIC FUNCTION PANEL
ALT: 31 IU/L (ref 0–32)
AST: 30 IU/L (ref 0–40)
Albumin: 4.3 g/dL (ref 3.8–4.9)
Alkaline Phosphatase: 87 IU/L (ref 39–117)
Bilirubin Total: 0.2 mg/dL (ref 0.0–1.2)
Bilirubin, Direct: 0.08 mg/dL (ref 0.00–0.40)
Total Protein: 6.6 g/dL (ref 6.0–8.5)

## 2019-09-13 ENCOUNTER — Other Ambulatory Visit: Payer: Self-pay | Admitting: Internal Medicine

## 2019-10-21 ENCOUNTER — Other Ambulatory Visit: Payer: Self-pay

## 2019-10-21 ENCOUNTER — Ambulatory Visit (INDEPENDENT_AMBULATORY_CARE_PROVIDER_SITE_OTHER): Payer: Managed Care, Other (non HMO) | Admitting: Internal Medicine

## 2019-10-21 ENCOUNTER — Encounter: Payer: Self-pay | Admitting: Internal Medicine

## 2019-10-21 DIAGNOSIS — D649 Anemia, unspecified: Secondary | ICD-10-CM

## 2019-10-21 DIAGNOSIS — I10 Essential (primary) hypertension: Secondary | ICD-10-CM

## 2019-10-21 DIAGNOSIS — E7800 Pure hypercholesterolemia, unspecified: Secondary | ICD-10-CM

## 2019-10-21 DIAGNOSIS — R739 Hyperglycemia, unspecified: Secondary | ICD-10-CM | POA: Diagnosis not present

## 2019-10-21 DIAGNOSIS — F439 Reaction to severe stress, unspecified: Secondary | ICD-10-CM

## 2019-10-21 NOTE — Assessment & Plan Note (Signed)
Blood pressure as outlined.  Continue current medication regimen.  Well controlled.  Follow met b.

## 2019-10-21 NOTE — Progress Notes (Signed)
Patient ID: Kristen Aguilar, female   DOB: 1965/12/08, 54 y.o.   MRN: [id number]   Virtual Visit via video Note  This visit type was conducted due to national recommendations for restrictions regarding the COVID-19 pandemic (e.g. social distancing).  This format is felt to be most appropriate for this patient at this time.  All issues noted in this document were discussed and addressed.  No physical exam was performed (except for noted visual exam findings with Video Visits).   I connected with [employer] by a video enabled telemedicine application and verified that I am speaking with the correct person using two identifiers. Location patient: home Location provider: work  Persons participating in the virtual visit: patient, provider  The limitations, risks, security and privacy concerns of performing an evaluation and management service by telephone and the availability of in person appointments have been discussed.  The patient expressed understanding and agreed to proceed.  Interactive audio and video telecommunications were attempted between this provider and patient.  I could connect in with her initially, but the video kept cutting in and out.  Due to technical difficulties we mutually decided to continue the visit as a telephone visit.  We continued and completed visit with audio only.   Reason for visit: scheduled follow up.   HPI: She reports she is doing relatively well.  Increased stress with husband working from home and son having to get up at 4:00 am to go to work.  Does disrupt her sleep.  Is able to go back to sleep.  Taking celexa.  Feels this is helping to level things out.  Does not feel needs any further intervention.  No cough or congestion reported.  Managing allergies.  No chest pain or sob reported.  No abdominal pain or bowel change mentioned.  She is up to date with colonoscopy - 05/2018.  Mammogram 06/24/19 - Birads I.  Taking her medication.  Blood pressure doing well  - 114/62.  Discussed monitoring carb intake.  Denies any vaginal spotting.    ROS: See pertinent positives and negatives per HPI.  Past Medical History:  Diagnosis Date  . Allergy   . Anemia    as a child  . Anxiety   . Depression   . Gestational diabetes   . Hypertension   . Pre-diabetes     Past Surgical History:  Procedure Laterality Date  . CESAREAN SECTION  2001 & 2004  . COLONOSCOPY WITH PROPOFOL N/A 06/09/2018   Procedure: COLONOSCOPY WITH PROPOFOL;  Surgeon: Jonathon Bellows, MD;  Location: [facility] ENDOSCOPY;  Service: Gastroenterology;  Laterality: N/A;    Family History  Problem Relation Age of Onset  . Hypertension Mother   . Hypertension Father   . Hypertension Maternal Grandmother   . Diabetes Maternal Grandmother   . Breast cancer Paternal Grandmother     SOCIAL HX: reviewed.    Current Outpatient Medications:  .  cetirizine (ZYRTEC) 10 MG tablet, Take 10 mg by mouth daily., Disp: , Rfl:  .  citalopram (CELEXA) 20 MG tablet, TAKE 1 AND 1/2 TABLETS BY  MOUTH DAILY, Disp: 135 tablet, Rfl: 3 .  Coenzyme Q10 (CO Q 10) 100 MG CAPS, Take 50 mg by mouth daily. , Disp: , Rfl:  .  fish oil-omega-3 fatty acids 1000 MG capsule, Take 2 g by mouth daily., Disp: , Rfl:  .  fluticasone (FLONASE) 50 MCG/ACT nasal spray, instill 2 sprays into each nostril once daily, Disp: 48 g, Rfl: 3 .  GARLIC PO, Take by mouth., Disp: , Rfl:  .  levothyroxine (SYNTHROID) 50 MCG tablet, TAKE 1 TABLET BY MOUTH  DAILY BEFORE BREAKFAST, Disp: 90 tablet, Rfl: 3 .  lisinopril (ZESTRIL) 20 MG tablet, TAKE 1 TABLET BY MOUTH ONCE DAILY, Disp: 90 tablet, Rfl: 3 .  Multiple Vitamin (MULTIVITAMIN WITH MINERALS) TABS, Take 1 tablet by mouth daily., Disp: , Rfl:  .  Multiple Vitamin (MULTIVITAMIN) tablet, Take 1 tablet by mouth daily., Disp: , Rfl:  .  nystatin (MYCOSTATIN) 100000 UNIT/ML suspension, Take 5 mLs (500,000 Units total) by mouth 3 (three) times daily as needed. Swish and spit, Disp: 60 mL, Rfl:  0 .  pravastatin (PRAVACHOL) 40 MG tablet, TAKE 1 TABLET BY MOUTH  DAILY, Disp: 90 tablet, Rfl: 1  EXAM:  VITALS per patient if applicable: 160/73  GENERAL: alert, oriented, appears well and in no acute distress  HEENT: atraumatic, conjunttiva clear, no obvious abnormalities on inspection of external nose and ears  NECK: normal movements of the head and neck  LUNGS: on inspection no signs of respiratory distress, breathing rate appears normal, no obvious gross SOB, gasping or wheezing  CV: no obvious cyanosis  PSYCH/NEURO: pleasant and cooperative, no obvious depression or anxiety, speech and thought processing grossly intact  ASSESSMENT AND PLAN:  Discussed the following assessment and plan:  Anemia Recent hgb 06/2019 wnl.    Hypercholesterolemia On pravastatin.  Low cholesterol diet and exercise.  Follow lipid panel and liver function tests.    Hyperglycemia Low carb diet and exercise.  Follow met b and a1c.    Hypertension Blood pressure as outlined.  Continue current medication regimen.  Well controlled.  Follow met b.    Stress Increased stress as outlined.  Discussed with her today.  Handling things well.  On citalopram.  Doing well on this medication.  Follow.     Orders Placed This Encounter  Procedures  . Hepatic function panel    Standing Status:   Future    Standing Expiration Date:   10/20/2020  . Lipid panel    Standing Status:   Future    Standing Expiration Date:   10/20/2020  . Basic metabolic panel (future)    Standing Status:   Future    Standing Expiration Date:   10/20/2020  . Hemoglobin A1c    Standing Status:   Future    Standing Expiration Date:   10/20/2020    No orders of the defined types were placed in this encounter.    I discussed the assessment and treatment plan with the patient. The patient was provided an opportunity to ask questions and all were answered. The patient agreed with the plan and demonstrated an understanding of the  instructions.   The patient was advised to call back or seek an in-person evaluation if the symptoms worsen or if the condition fails to improve as anticipated.  I provided 15 minutes of non-face-to-face time during this encounter.   Einar Pheasant, MD

## 2019-10-21 NOTE — Assessment & Plan Note (Signed)
On pravastatin.  Low cholesterol diet and exercise.  Follow lipid panel and liver function tests.   

## 2019-10-21 NOTE — Assessment & Plan Note (Signed)
Increased stress as outlined.  Discussed with her today.  Handling things well.  On citalopram.  Doing well on this medication.  Follow.

## 2019-10-21 NOTE — Assessment & Plan Note (Signed)
Recent hgb 06/2019 wnl.

## 2019-10-21 NOTE — Assessment & Plan Note (Signed)
Low carb diet and exercise.  Follow met b and a1c.   

## 2019-10-24 ENCOUNTER — Other Ambulatory Visit: Payer: Self-pay | Admitting: Internal Medicine

## 2019-11-17 ENCOUNTER — Other Ambulatory Visit: Payer: Self-pay

## 2019-11-24 ENCOUNTER — Other Ambulatory Visit: Payer: Self-pay

## 2019-11-24 ENCOUNTER — Other Ambulatory Visit (INDEPENDENT_AMBULATORY_CARE_PROVIDER_SITE_OTHER): Payer: Managed Care, Other (non HMO)

## 2019-11-24 DIAGNOSIS — E7800 Pure hypercholesterolemia, unspecified: Secondary | ICD-10-CM

## 2019-11-24 DIAGNOSIS — R739 Hyperglycemia, unspecified: Secondary | ICD-10-CM

## 2019-11-24 DIAGNOSIS — I10 Essential (primary) hypertension: Secondary | ICD-10-CM

## 2019-11-24 NOTE — Addendum Note (Signed)
Addended by: Warden Fillers on: 11/24/2019 08:45 AM   Modules accepted: Orders

## 2019-11-26 LAB — BASIC METABOLIC PANEL
BUN/Creatinine Ratio: 13 (ref 9–23)
BUN: 13 mg/dL (ref 6–24)
CO2: 25 mmol/L (ref 20–29)
Calcium: 9.6 mg/dL (ref 8.7–10.2)
Chloride: 103 mmol/L (ref 96–106)
Creatinine, Ser: 0.98 mg/dL (ref 0.57–1.00)
GFR calc Af Amer: 76 mL/min/{1.73_m2} (ref >59)
GFR calc non Af Amer: 66 mL/min/{1.73_m2} (ref >59)
Glucose: 103 mg/dL — ABNORMAL HIGH (ref 65–99)
Potassium: 5 mmol/L (ref 3.5–5.2)
Sodium: 142 mmol/L (ref 134–144)

## 2019-11-26 LAB — HEPATIC FUNCTION PANEL
ALT: 18 IU/L (ref 0–32)
AST: 17 IU/L (ref 0–40)
Albumin: 4.1 g/dL (ref 3.8–4.9)
Alkaline Phosphatase: 94 IU/L (ref 39–117)
Bilirubin Total: 0.5 mg/dL (ref 0.0–1.2)
Bilirubin, Direct: 0.13 mg/dL (ref 0.00–0.40)
Total Protein: 6.8 g/dL (ref 6.0–8.5)

## 2019-11-26 LAB — LIPID PANEL
Chol/HDL Ratio: 3.4 ratio (ref 0.0–4.4)
Cholesterol, Total: 173 mg/dL (ref 100–199)
HDL: 51 mg/dL (ref >39)
LDL Chol Calc (NIH): 95 mg/dL (ref 0–99)
Triglycerides: 153 mg/dL — ABNORMAL HIGH (ref 0–149)
VLDL Cholesterol Cal: 27 mg/dL (ref 5–40)

## 2019-11-26 LAB — HEMOGLOBIN A1C
Est. average glucose Bld gHb Est-mCnc: 134 mg/dL
Hgb A1c MFr Bld: 6.3 % — ABNORMAL HIGH (ref 4.8–5.6)

## 2019-11-30 ENCOUNTER — Telehealth: Payer: Self-pay

## 2019-11-30 ENCOUNTER — Other Ambulatory Visit: Payer: Self-pay

## 2019-11-30 ENCOUNTER — Other Ambulatory Visit (INDEPENDENT_AMBULATORY_CARE_PROVIDER_SITE_OTHER): Payer: Managed Care, Other (non HMO)

## 2019-11-30 DIAGNOSIS — E875 Hyperkalemia: Secondary | ICD-10-CM

## 2019-11-30 NOTE — Telephone Encounter (Signed)
Labs reordered for potassium recheck

## 2019-11-30 NOTE — Telephone Encounter (Signed)
Labs ordered for [facility] ?

## 2019-12-01 ENCOUNTER — Encounter: Payer: Self-pay | Admitting: Internal Medicine

## 2019-12-01 LAB — POTASSIUM: Potassium: 4.6 mmol/L (ref 3.5–5.2)

## 2019-12-04 ENCOUNTER — Other Ambulatory Visit: Payer: Self-pay | Admitting: Internal Medicine

## 2020-02-24 ENCOUNTER — Other Ambulatory Visit: Payer: Self-pay

## 2020-02-24 ENCOUNTER — Telehealth (INDEPENDENT_AMBULATORY_CARE_PROVIDER_SITE_OTHER): Payer: Managed Care, Other (non HMO) | Admitting: Internal Medicine

## 2020-02-24 ENCOUNTER — Encounter: Payer: Self-pay | Admitting: Internal Medicine

## 2020-02-24 DIAGNOSIS — F439 Reaction to severe stress, unspecified: Secondary | ICD-10-CM

## 2020-02-24 DIAGNOSIS — R739 Hyperglycemia, unspecified: Secondary | ICD-10-CM | POA: Diagnosis not present

## 2020-02-24 DIAGNOSIS — Z9109 Other allergy status, other than to drugs and biological substances: Secondary | ICD-10-CM

## 2020-02-24 DIAGNOSIS — E7800 Pure hypercholesterolemia, unspecified: Secondary | ICD-10-CM | POA: Diagnosis not present

## 2020-02-24 DIAGNOSIS — I10 Essential (primary) hypertension: Secondary | ICD-10-CM

## 2020-02-24 NOTE — Assessment & Plan Note (Signed)
Discussed low carb diet and exercise.  Follow met b and a1c.  

## 2020-02-24 NOTE — Assessment & Plan Note (Signed)
Blood pressure as outlined.  Continue lisinopril.  Follow pressures.  Follow metabolic panel.   

## 2020-02-24 NOTE — Assessment & Plan Note (Signed)
Increased stress.  On citalopram.  Overall appears to be handling things relatively well.  Continue citalopram.  Follow.

## 2020-02-24 NOTE — Progress Notes (Signed)
Patient ID: Kristen Aguilar, female   DOB: 1966/08/13, 54 y.o.   MRN: [id number]   Virtual Visit via telephone Note  This visit type was conducted due to national recommendations for restrictions regarding the COVID-19 pandemic (e.g. social distancing).  This format is felt to be most appropriate for this patient at this time.  All issues noted in this document were discussed and addressed.  No physical exam was performed (except for noted visual exam findings with Video Visits).   I connected with [employer] by telephone and verified that I am speaking with the correct person using two identifiers. Location patient: home Location provider: work  Persons participating in the telephone visit: patient, provider  The limitations, risks, security and privacy concerns of performing an evaluation and management service by telephone and the availability of in person appointments have been discussed.  The patient expressed understanding and agreed to proceed.    Reason for visit: scheduled follow up.    HPI: Follow up visit.  Following up regarding her hypertension and hypercholesterolemia.  She reports she is doing relatively well.  She does report increased fatigue.  Feels is related to increased "running" - taking parents to appts, etc.  She is sleeping better.  Overall feels is handling stress.  On citalopram.  Feels - handling things well.  No chest pain or sob reported.  No chest congestion.  Does report noticing some nasal congestion and stuffiness.  Worse in am.  Taking otc equate cold medication at night and zyrtec in am.  Off flonase secondary to nose bleeding.  Discussed saline nasal spray.  Feels her regimen is controlling symptoms.  No nausea or vomiting.     ROS: See pertinent positives and negatives per HPI.  Past Medical History:  Diagnosis Date  . Allergy   . Anemia    as a child  . Anxiety   . Depression   . Gestational diabetes   . Hypertension   . Pre-diabetes      Past Surgical History:  Procedure Laterality Date  . CESAREAN SECTION  2001 & 2004  . COLONOSCOPY WITH PROPOFOL N/A 06/09/2018   Procedure: COLONOSCOPY WITH PROPOFOL;  Surgeon: Jonathon Bellows, MD;  Location: [facility] ENDOSCOPY;  Service: Gastroenterology;  Laterality: N/A;    Family History  Problem Relation Age of Onset  . Hypertension Mother   . Hypertension Father   . Hypertension Maternal Grandmother   . Diabetes Maternal Grandmother   . Breast cancer Paternal Grandmother     SOCIAL HX: reviewed.    Current Outpatient Medications:  .  cetirizine (ZYRTEC) 10 MG tablet, Take 10 mg by mouth daily., Disp: , Rfl:  .  citalopram (CELEXA) 20 MG tablet, TAKE 1 AND 1/2 TABLETS BY  MOUTH DAILY, Disp: 135 tablet, Rfl: 3 .  Coenzyme Q10 (CO Q 10) 100 MG CAPS, Take 50 mg by mouth daily. , Disp: , Rfl:  .  fish oil-omega-3 fatty acids 1000 MG capsule, Take 2 g by mouth daily., Disp: , Rfl:  .  fluticasone (FLONASE) 50 MCG/ACT nasal spray, USE 2 SPRAYS IN EACH  NOSTRIL DAILY, Disp: 48 g, Rfl: 3 .  GARLIC PO, Take by mouth., Disp: , Rfl:  .  levothyroxine (SYNTHROID) 50 MCG tablet, TAKE 1 TABLET BY MOUTH  DAILY BEFORE BREAKFAST, Disp: 90 tablet, Rfl: 3 .  lisinopril (ZESTRIL) 20 MG tablet, TAKE 1 TABLET BY MOUTH ONCE DAILY, Disp: 90 tablet, Rfl: 3 .  Multiple Vitamin (MULTIVITAMIN WITH MINERALS) TABS, Take 1  tablet by mouth daily., Disp: , Rfl:  .  Multiple Vitamin (MULTIVITAMIN) tablet, Take 1 tablet by mouth daily., Disp: , Rfl:  .  nystatin (MYCOSTATIN) 100000 UNIT/ML suspension, Take 5 mLs (500,000 Units total) by mouth 3 (three) times daily as needed. Swish and spit, Disp: 60 mL, Rfl: 0 .  pravastatin (PRAVACHOL) 40 MG tablet, TAKE 1 TABLET BY MOUTH  DAILY, Disp: 90 tablet, Rfl: 3  EXAM:  VITALS per patient if applicable: 591/36  GENERAL: alert.  Sounds to be in no acute distress.  Answering questions appropriately.    PSYCH/NEURO: pleasant and cooperative, no obvious depression or  anxiety, speech and thought processing grossly intact  ASSESSMENT AND PLAN:  Discussed the following assessment and plan:  Stress Increased stress.  On citalopram.  Overall appears to be handling things relatively well.  Continue citalopram.  Follow.    Hypertension Blood pressure as outlined.  Continue lisinopril.  Follow pressures.  Follow metabolic panel.   Hyperglycemia Discussed low carb diet and exercise.  Follow met b and a1c.   Hypercholesterolemia On pravastatin.  Last LDL 95.  Low cholesterol diet and exercise.  Follow lipid panel and liver function tests.    Environmental allergies Symptoms as outlined.  Add saline nasal spray as directed.  Her regimen is working as outlined.  Follow.  Notify me if symptoms worsen.     Orders Placed This Encounter  Procedures  . Hemoglobin A1c    Standing Status:   Future    Standing Expiration Date:   02/23/2021  . Hepatic function panel    Standing Status:   Future    Standing Expiration Date:   02/23/2021  . Lipid panel    Standing Status:   Future    Standing Expiration Date:   02/23/2021  . Basic metabolic panel    Standing Status:   Future    Standing Expiration Date:   02/23/2021     I discussed the assessment and treatment plan with the patient. The patient was provided an opportunity to ask questions and all were answered. The patient agreed with the plan and demonstrated an understanding of the instructions.   The patient was advised to call back or seek an in-person evaluation if the symptoms worsen or if the condition fails to improve as anticipated.  I provided 23 minutes of non-face-to-face time during this encounter.   Einar Pheasant, MD

## 2020-02-24 NOTE — Assessment & Plan Note (Signed)
On pravastatin.  Last LDL 95.  Low cholesterol diet and exercise.  Follow lipid panel and liver function tests.

## 2020-02-24 NOTE — Assessment & Plan Note (Signed)
Symptoms as outlined.  Add saline nasal spray as directed.  Her regimen is working as outlined.  Follow.  Notify me if symptoms worsen.

## 2020-03-24 ENCOUNTER — Other Ambulatory Visit (INDEPENDENT_AMBULATORY_CARE_PROVIDER_SITE_OTHER): Payer: Managed Care, Other (non HMO)

## 2020-03-24 ENCOUNTER — Other Ambulatory Visit: Payer: Self-pay

## 2020-03-24 DIAGNOSIS — R739 Hyperglycemia, unspecified: Secondary | ICD-10-CM | POA: Diagnosis not present

## 2020-03-24 DIAGNOSIS — E7800 Pure hypercholesterolemia, unspecified: Secondary | ICD-10-CM

## 2020-03-24 DIAGNOSIS — I10 Essential (primary) hypertension: Secondary | ICD-10-CM

## 2020-03-24 NOTE — Addendum Note (Signed)
Addended by: Bonnell Public I on: 03/24/2020 08:05 AM   Modules accepted: Orders

## 2020-03-25 LAB — BASIC METABOLIC PANEL
BUN/Creatinine Ratio: 15 (ref 9–23)
BUN: 13 mg/dL (ref 6–24)
CO2: 24 mmol/L (ref 20–29)
Calcium: 9.3 mg/dL (ref 8.7–10.2)
Chloride: 101 mmol/L (ref 96–106)
Creatinine, Ser: 0.88 mg/dL (ref 0.57–1.00)
GFR calc Af Amer: 86 mL/min/{1.73_m2} (ref >59)
GFR calc non Af Amer: 74 mL/min/{1.73_m2} (ref >59)
Glucose: 106 mg/dL — ABNORMAL HIGH (ref 65–99)
Potassium: 4.9 mmol/L (ref 3.5–5.2)
Sodium: 140 mmol/L (ref 134–144)

## 2020-03-25 LAB — HEPATIC FUNCTION PANEL
ALT: 22 IU/L (ref 0–32)
AST: 19 IU/L (ref 0–40)
Albumin: 4.4 g/dL (ref 3.8–4.9)
Alkaline Phosphatase: 97 IU/L (ref 48–121)
Bilirubin Total: 0.6 mg/dL (ref 0.0–1.2)
Bilirubin, Direct: 0.14 mg/dL (ref 0.00–0.40)
Total Protein: 7.2 g/dL (ref 6.0–8.5)

## 2020-03-25 LAB — LIPID PANEL
Chol/HDL Ratio: 3.7 ratio (ref 0.0–4.4)
Cholesterol, Total: 179 mg/dL (ref 100–199)
HDL: 48 mg/dL (ref >39)
LDL Chol Calc (NIH): 106 mg/dL — ABNORMAL HIGH (ref 0–99)
Triglycerides: 143 mg/dL (ref 0–149)
VLDL Cholesterol Cal: 25 mg/dL (ref 5–40)

## 2020-03-25 LAB — HEMOGLOBIN A1C
Est. average glucose Bld gHb Est-mCnc: 126 mg/dL
Hgb A1c MFr Bld: 6 % — ABNORMAL HIGH (ref 4.8–5.6)

## 2020-06-02 LAB — LIPID PANEL
Cholesterol: 189 (ref 0–200)
HDL: 51 (ref 35–70)
LDL Cholesterol: 110
Triglycerides: 159 (ref 40–160)

## 2020-06-02 LAB — COMPREHENSIVE METABOLIC PANEL
GFR calc Af Amer: 93
GFR calc non Af Amer: 81

## 2020-06-02 LAB — BASIC METABOLIC PANEL
Creatinine: 0.8 (ref <=1.1)
Glucose: 105

## 2020-06-02 LAB — HEMOGLOBIN A1C: Hemoglobin A1C: 6.2

## 2020-06-15 ENCOUNTER — Other Ambulatory Visit: Payer: Self-pay | Admitting: Internal Medicine

## 2020-07-12 ENCOUNTER — Ambulatory Visit (INDEPENDENT_AMBULATORY_CARE_PROVIDER_SITE_OTHER): Payer: Managed Care, Other (non HMO)

## 2020-07-12 ENCOUNTER — Ambulatory Visit (INDEPENDENT_AMBULATORY_CARE_PROVIDER_SITE_OTHER): Payer: Managed Care, Other (non HMO) | Admitting: Internal Medicine

## 2020-07-12 ENCOUNTER — Other Ambulatory Visit: Payer: Self-pay

## 2020-07-12 VITALS — BP 144/80 | HR 87 | Temp 98.2°F | Resp 16 | Ht 64.0 in | Wt 230.4 lb

## 2020-07-12 DIAGNOSIS — E7800 Pure hypercholesterolemia, unspecified: Secondary | ICD-10-CM

## 2020-07-12 DIAGNOSIS — F439 Reaction to severe stress, unspecified: Secondary | ICD-10-CM

## 2020-07-12 DIAGNOSIS — Z23 Encounter for immunization: Secondary | ICD-10-CM | POA: Diagnosis not present

## 2020-07-12 DIAGNOSIS — M25561 Pain in right knee: Secondary | ICD-10-CM

## 2020-07-12 DIAGNOSIS — Z1231 Encounter for screening mammogram for malignant neoplasm of breast: Secondary | ICD-10-CM

## 2020-07-12 DIAGNOSIS — R739 Hyperglycemia, unspecified: Secondary | ICD-10-CM

## 2020-07-12 DIAGNOSIS — D649 Anemia, unspecified: Secondary | ICD-10-CM

## 2020-07-12 DIAGNOSIS — Z0000 Encounter for general adult medical examination without abnormal findings: Secondary | ICD-10-CM

## 2020-07-12 DIAGNOSIS — I10 Essential (primary) hypertension: Secondary | ICD-10-CM

## 2020-07-12 NOTE — Assessment & Plan Note (Signed)
Blood pressure elevated today and elevated on recent screening.  States blood pressures at home averaging systolics in 120s.  Have her spot check her pressure.  Get her back in soon to reassess.  Bring her cuff.  Recent creatine wnl.

## 2020-07-12 NOTE — Assessment & Plan Note (Signed)
Physical today 07/12/20. PAP 06/18/19 - negative with negative HPV.  Colonoscopy 05/2018.  Recommended f/u in 10 years.

## 2020-07-12 NOTE — Progress Notes (Signed)
Patient ID: Kristen Aguilar, female   DOB: 03/16/1966, 54 y.o.   MRN: [id number]   Subjective:    Patient ID: Kristen Aguilar, female    DOB: 01-23-1966, 54 y.o.   MRN: [id number]  HPI This visit occurred during the SARS-CoV-2 public health emergency.  Safety protocols were in place, including screening questions prior to the visit, additional usage of staff PPE, and extensive cleaning of exam room while observing appropriate contact time as indicated for disinfecting solutions.  Patient here for her physical exam.  She reports she is doing relatively well.  Increased stress. Family stress.  Overall feels she is handling things relatively well.  Does not feel needs any further intervention.  No chest pain or sob.  Some allergy symptoms.  Controls with her current medication regimen.  No acid reflux.  No abdominal pain or bowel change.  No vaginal problems.  Discussed her recent screening labs.  Discussed elevated a1c and diet and exercise.  She is having right knee pain.  Persistent.  Worse when has been sitting and goes to stand.  Limits her activity.    Past Medical History:  Diagnosis Date  . Allergy   . Anemia    as a child  . Anxiety   . Depression   . Gestational diabetes   . Hypertension   . Pre-diabetes    Past Surgical History:  Procedure Laterality Date  . CESAREAN SECTION  2001 & 2004  . COLONOSCOPY WITH PROPOFOL N/A 06/09/2018   Procedure: COLONOSCOPY WITH PROPOFOL;  Surgeon: Jonathon Bellows, MD;  Location: [facility] ENDOSCOPY;  Service: Gastroenterology;  Laterality: N/A;   Family History  Problem Relation Age of Onset  . Hypertension Mother   . Hypertension Father   . Hypertension Maternal Grandmother   . Diabetes Maternal Grandmother   . Breast cancer Paternal Grandmother    Social History   Socioeconomic History  . Marital status: Married    Spouse name: Not on file  . Number of children: 2  . Years of education: Not on file  . Highest education level: Not on file    Occupational History  . Not on file  Tobacco Use  . Smoking status: Never Smoker  . Smokeless tobacco: Never Used  Vaping Use  . Vaping Use: Never used  Substance and Sexual Activity  . Alcohol use: No    Alcohol/week: 0.0 standard drinks  . Drug use: No  . Sexual activity: Not on file  Other Topics Concern  . Not on file  Social History Narrative  . Not on file   Social Determinants of Health   Financial Resource Strain:   . Difficulty of Paying Living Expenses: Not on file  Food Insecurity:   . Worried About Charity fundraiser in the Last Year: Not on file  . Ran Out of Food in the Last Year: Not on file  Transportation Needs:   . Lack of Transportation (Medical): Not on file  . Lack of Transportation (Non-Medical): Not on file  Physical Activity:   . Days of Exercise per Week: Not on file  . Minutes of Exercise per Session: Not on file  Stress:   . Feeling of Stress : Not on file  Social Connections:   . Frequency of Communication with Friends and Family: Not on file  . Frequency of Social Gatherings with Friends and Family: Not on file  . Attends Religious Services: Not on file  . Active Member of Clubs or Organizations: Not on  file  . Attends Archivist Meetings: Not on file  . Marital Status: Not on file    Outpatient Encounter Medications as of 07/12/2020  Medication Sig  . cetirizine (ZYRTEC) 10 MG tablet Take 10 mg by mouth daily.  . citalopram (CELEXA) 20 MG tablet TAKE 1 AND 1/2 TABLETS BY  MOUTH DAILY  . Coenzyme Q10 (CO Q 10) 100 MG CAPS Take 50 mg by mouth daily.   . fish oil-omega-3 fatty acids 1000 MG capsule Take 2 g by mouth daily.  . fluticasone (FLONASE) 50 MCG/ACT nasal spray USE 2 SPRAYS IN EACH  NOSTRIL DAILY  . GARLIC PO Take by mouth.  . levothyroxine (SYNTHROID) 50 MCG tablet TAKE 1 TABLET BY MOUTH  DAILY BEFORE BREAKFAST  . lisinopril (ZESTRIL) 20 MG tablet TAKE 1 TABLET BY MOUTH ONCE DAILY  . Multiple Vitamin (MULTIVITAMIN  WITH MINERALS) TABS Take 1 tablet by mouth daily.  . Multiple Vitamin (MULTIVITAMIN) tablet Take 1 tablet by mouth daily.  . pravastatin (PRAVACHOL) 40 MG tablet TAKE 1 TABLET BY MOUTH  DAILY  . [DISCONTINUED] nystatin (MYCOSTATIN) 100000 UNIT/ML suspension Take 5 mLs (500,000 Units total) by mouth 3 (three) times daily as needed. Swish and spit   No facility-administered encounter medications on file as of 07/12/2020.    Review of Systems  Constitutional: Negative for appetite change and unexpected weight change.  HENT: Negative for congestion and sinus pressure.        Allergies controlled.   Eyes: Negative for pain and visual disturbance.  Respiratory: Negative for cough, chest tightness and shortness of breath.   Cardiovascular: Negative for chest pain, palpitations and leg swelling.  Gastrointestinal: Negative for abdominal pain, diarrhea, nausea and vomiting.  Genitourinary: Negative for difficulty urinating and dysuria.  Musculoskeletal: Negative for myalgias.       Persistent right knee pain.    Skin: Negative for color change and rash.  Neurological: Negative for dizziness, light-headedness and headaches.  Hematological: Negative for adenopathy. Does not bruise/bleed easily.  Psychiatric/Behavioral: Negative for agitation and dysphoric mood.       Objective:    Physical Exam Vitals reviewed.  Constitutional:      General: She is not in acute distress.    Appearance: Normal appearance. She is well-developed.  HENT:     Head: Normocephalic and atraumatic.     Right Ear: Tympanic membrane, ear canal and external ear normal.     Left Ear: Tympanic membrane, ear canal and external ear normal.  Eyes:     General: No scleral icterus.       Right eye: No discharge.        Left eye: No discharge.     Conjunctiva/sclera: Conjunctivae normal.  Neck:     Thyroid: No thyromegaly.  Cardiovascular:     Rate and Rhythm: Normal rate and regular rhythm.  Pulmonary:     Effort: No  tachypnea, accessory muscle usage or respiratory distress.     Breath sounds: Normal breath sounds. No decreased breath sounds or wheezing.  Chest:     Breasts:        Right: No inverted nipple, mass, nipple discharge or tenderness (no axillary adenopathy).        Left: No inverted nipple, mass, nipple discharge or tenderness (no axilarry adenopathy).  Abdominal:     General: Bowel sounds are normal.     Palpations: Abdomen is soft.     Tenderness: There is no abdominal tenderness.  Musculoskeletal:  General: No swelling or tenderness.     Cervical back: Neck supple. No tenderness.     Comments: No significant pain with resistance to flexion and extension - right knee.   Lymphadenopathy:     Cervical: No cervical adenopathy.  Skin:    Findings: No erythema or rash.  Neurological:     General: No focal deficit present.     Mental Status: She is alert and oriented to person, place, and time.  Psychiatric:        Mood and Affect: Mood normal.        Behavior: Behavior normal.     BP (!) 144/80   Pulse 87   Temp 98.2 F (36.8 C) (Oral)   Resp 16   Ht '5\' 4"'  (1.626 m)   Wt 230 lb 6.4 oz (104.5 kg)   SpO2 98%   BMI 39.55 kg/m  Wt Readings from Last 3 Encounters:  07/12/20 230 lb 6.4 oz (104.5 kg)  02/24/20 222 lb (100.7 kg)  10/21/19 220 lb (99.8 kg)     Lab Results  Component Value Date   WBC 7.9 06/18/2019   HGB 14.0 06/18/2019   HCT 40.9 06/18/2019   PLT 328 06/18/2019   GLUCOSE 106 (H) 03/24/2020   CHOL 179 03/24/2020   TRIG 143 03/24/2020   HDL 48 03/24/2020   LDLDIRECT 135.3 07/20/2014   LDLCALC 106 (H) 03/24/2020   ALT 22 03/24/2020   AST 19 03/24/2020   NA 140 03/24/2020   K 4.9 03/24/2020   CL 101 03/24/2020   CREATININE 0.88 03/24/2020   BUN 13 03/24/2020   CO2 24 03/24/2020   TSH 3.670 06/18/2019   HGBA1C 6.0 (H) 03/24/2020       Assessment & Plan:   Problem List Items Addressed This Visit    Stress    Overall handling things well.   Continue citalopram.  Follow.       Right knee pain    Persistent right knee pain limiting activity.  Check xray.  Further w/up pending results.        Relevant Orders   DG Knee 1-2 Views Right (Completed)   Hypertension    Blood pressure elevated today and elevated on recent screening.  States blood pressures at home averaging systolics in 176H.  Have her spot check her pressure.  Get her back in soon to reassess.  Bring her cuff.  Recent creatine wnl.        Hyperglycemia    Low carb diet and exercise.  Follow met b and a1c.       Hypercholesterolemia    On pravastatin.  Low cholesterol diet and exercise.  Follow lipid panel and liver function tests.        Health care maintenance    Physical today 07/12/20. PAP 06/18/19 - negative with negative HPV.  Colonoscopy 05/2018.  Recommended f/u in 10 years.        Anemia    Follow cbc.        Other Visit Diagnoses    Routine general medical examination at a health care facility    -  Primary   Need for immunization against influenza       Relevant Orders   Flu Vaccine QUAD 36+ mos IM (Completed)   Encounter for screening mammogram for malignant neoplasm of breast       Relevant Orders   MM 3D SCREEN BREAST BILATERAL       Einar Pheasant, MD

## 2020-07-13 ENCOUNTER — Telehealth: Payer: Self-pay | Admitting: Internal Medicine

## 2020-07-13 NOTE — Telephone Encounter (Signed)
See result note.  

## 2020-07-13 NOTE — Telephone Encounter (Signed)
Pt called to get xray results  

## 2020-07-24 ENCOUNTER — Encounter: Payer: Self-pay | Admitting: Internal Medicine

## 2020-07-24 NOTE — Assessment & Plan Note (Signed)
Follow cbc.  

## 2020-07-24 NOTE — Assessment & Plan Note (Signed)
On pravastatin.  Low cholesterol diet and exercise.  Follow lipid panel and liver function tests.   

## 2020-07-24 NOTE — Assessment & Plan Note (Signed)
Low carb diet and exercise.  Follow met b and a1c.  

## 2020-07-24 NOTE — Assessment & Plan Note (Signed)
Persistent right knee pain limiting activity.  Check xray.  Further w/up pending results.

## 2020-07-24 NOTE — Assessment & Plan Note (Signed)
Overall handling things well.  Continue citalopram.  Follow.

## 2020-07-27 ENCOUNTER — Telehealth: Payer: Self-pay

## 2020-07-27 NOTE — Telephone Encounter (Signed)
Pt dropped off Appeals form to be completed by Dr. Lorin Picket. Placed in colored folder in front office for Dr. Lorin Picket.

## 2020-07-27 NOTE — Telephone Encounter (Signed)
Appeals form placed in forms folder for signature

## 2020-07-29 NOTE — Telephone Encounter (Signed)
My chart sent to patient. Form placed up front.

## 2020-07-29 NOTE — Telephone Encounter (Signed)
Form signed and placed in box.   

## 2020-09-20 ENCOUNTER — Other Ambulatory Visit: Payer: Self-pay | Admitting: Internal Medicine

## 2020-09-22 ENCOUNTER — Encounter: Payer: Self-pay | Admitting: Internal Medicine

## 2020-09-22 ENCOUNTER — Other Ambulatory Visit: Payer: Self-pay

## 2020-09-22 ENCOUNTER — Ambulatory Visit: Payer: Managed Care, Other (non HMO) | Admitting: Internal Medicine

## 2020-09-22 DIAGNOSIS — R739 Hyperglycemia, unspecified: Secondary | ICD-10-CM | POA: Diagnosis not present

## 2020-09-22 DIAGNOSIS — F439 Reaction to severe stress, unspecified: Secondary | ICD-10-CM

## 2020-09-22 DIAGNOSIS — D649 Anemia, unspecified: Secondary | ICD-10-CM

## 2020-09-22 DIAGNOSIS — I10 Essential (primary) hypertension: Secondary | ICD-10-CM | POA: Diagnosis not present

## 2020-09-22 DIAGNOSIS — E7800 Pure hypercholesterolemia, unspecified: Secondary | ICD-10-CM

## 2020-09-22 NOTE — Progress Notes (Signed)
Patient ID: Kristen Aguilar, female   DOB: Dec 19, 1965, 54 y.o.   MRN: [id number]   Subjective:    Patient ID: Kristen Aguilar, female    DOB: 1966-05-08, 54 y.o.   MRN: [id number]  HPI This visit occurred during the SARS-CoV-2 public health emergency.  Safety protocols were in place, including screening questions prior to the visit, additional usage of staff PPE, and extensive cleaning of exam room while observing appropriate contact time as indicated for disinfecting solutions.  Patient here for a scheduled follow up.  Here to follow up regarding her blood pressure and blood sugar.  Increased stress recently as outlined in last note.  Overall appears to be handling things relatively well.  Does not feel needs any further intervention.  Continue citalopram.  Outside blood pressure checks averaging 120-130/60-70s.  Taking lisinopril.  No chest pain or sob reported. No abdominal pain or bowel change reported.  Discussed diet and exercise.    Past Medical History:  Diagnosis Date  . Allergy   . Anemia    as a child  . Anxiety   . Depression   . Gestational diabetes   . Hypertension   . Pre-diabetes    Past Surgical History:  Procedure Laterality Date  . CESAREAN SECTION  2001 & 2004  . COLONOSCOPY WITH PROPOFOL N/A 06/09/2018   Procedure: COLONOSCOPY WITH PROPOFOL;  Surgeon: Jonathon Bellows, MD;  Location: [facility] ENDOSCOPY;  Service: Gastroenterology;  Laterality: N/A;   Family History  Problem Relation Age of Onset  . Hypertension Mother   . Hypertension Father   . Hypertension Maternal Grandmother   . Diabetes Maternal Grandmother   . Breast cancer Paternal Grandmother    Social History   Socioeconomic History  . Marital status: Married    Spouse name: Not on file  . Number of children: 2  . Years of education: Not on file  . Highest education level: Not on file  Occupational History  . Not on file  Tobacco Use  . Smoking status: Never Smoker  . Smokeless tobacco: Never Used   Vaping Use  . Vaping Use: Never used  Substance and Sexual Activity  . Alcohol use: No    Alcohol/week: 0.0 standard drinks  . Drug use: No  . Sexual activity: Not on file  Other Topics Concern  . Not on file  Social History Narrative  . Not on file   Social Determinants of Health   Financial Resource Strain: Not on file  Food Insecurity: Not on file  Transportation Needs: Not on file  Physical Activity: Not on file  Stress: Not on file  Social Connections: Not on file    Outpatient Encounter Medications as of 09/22/2020  Medication Sig  . cetirizine (ZYRTEC) 10 MG tablet Take 10 mg by mouth daily.  . citalopram (CELEXA) 20 MG tablet TAKE 1 AND 1/2 TABLETS BY  MOUTH DAILY  . Coenzyme Q10 (CO Q 10) 100 MG CAPS Take 50 mg by mouth daily.   Marland Kitchen ELDERBERRY PO Take 50 mg by mouth daily.  . fish oil-omega-3 fatty acids 1000 MG capsule Take 2 g by mouth daily.  . fluticasone (FLONASE) 50 MCG/ACT nasal spray USE 2 SPRAYS IN EACH  NOSTRIL DAILY  . GARLIC PO Take by mouth.  . levothyroxine (SYNTHROID) 50 MCG tablet TAKE 1 TABLET BY MOUTH  DAILY BEFORE BREAKFAST  . lisinopril (ZESTRIL) 20 MG tablet TAKE 1 TABLET BY MOUTH ONCE DAILY  . Multiple Vitamin (MULTIVITAMIN WITH MINERALS) TABS Take  1 tablet by mouth daily.  . Multiple Vitamin (MULTIVITAMIN) tablet Take 1 tablet by mouth daily.  . pravastatin (PRAVACHOL) 40 MG tablet TAKE 1 TABLET BY MOUTH  DAILY   No facility-administered encounter medications on file as of 09/22/2020.    Review of Systems  Constitutional: Negative for appetite change and unexpected weight change.  HENT: Negative for congestion and sinus pressure.   Respiratory: Negative for cough, chest tightness and shortness of breath.   Cardiovascular: Negative for chest pain, palpitations and leg swelling.  Gastrointestinal: Negative for abdominal pain, diarrhea, nausea and vomiting.  Genitourinary: Negative for difficulty urinating and dysuria.  Musculoskeletal:  Negative for joint swelling and myalgias.  Neurological: Negative for dizziness, light-headedness and headaches.  Psychiatric/Behavioral: Negative for agitation and dysphoric mood.       Objective:    Physical Exam Vitals reviewed.  Constitutional:      General: She is not in acute distress.    Appearance: Normal appearance.  HENT:     Head: Normocephalic and atraumatic.     Right Ear: External ear normal.     Left Ear: External ear normal.     Mouth/Throat:     Mouth: Oropharynx is clear and moist.  Eyes:     General: No scleral icterus.       Right eye: No discharge.        Left eye: No discharge.     Conjunctiva/sclera: Conjunctivae normal.  Neck:     Thyroid: No thyromegaly.  Cardiovascular:     Rate and Rhythm: Normal rate and regular rhythm.  Pulmonary:     Effort: No respiratory distress.     Breath sounds: Normal breath sounds. No wheezing.  Abdominal:     General: Bowel sounds are normal.     Palpations: Abdomen is soft.     Tenderness: There is no abdominal tenderness.  Musculoskeletal:        General: No swelling, tenderness or edema.     Cervical back: Neck supple. No tenderness.  Lymphadenopathy:     Cervical: No cervical adenopathy.  Skin:    Findings: No erythema or rash.  Neurological:     Mental Status: She is alert.  Psychiatric:        Mood and Affect: Mood normal.        Behavior: Behavior normal.     BP 137/82   Pulse (!) 104   Temp 98.5 F (36.9 C)   Ht 5' 4.02" (1.626 m)   Wt 225 lb 3.2 oz (102.2 kg)   SpO2 97%   BMI 38.64 kg/m  Wt Readings from Last 3 Encounters:  09/22/20 225 lb 3.2 oz (102.2 kg)  07/12/20 230 lb 6.4 oz (104.5 kg)  02/24/20 222 lb (100.7 kg)     Lab Results  Component Value Date   WBC 7.9 06/18/2019   HGB 14.0 06/18/2019   HCT 40.9 06/18/2019   PLT 328 06/18/2019   GLUCOSE 106 (H) 03/24/2020   CHOL 189 06/02/2020   TRIG 159 06/02/2020   HDL 51 06/02/2020   LDLDIRECT 135.3 07/20/2014   LDLCALC 110  06/02/2020   ALT 22 03/24/2020   AST 19 03/24/2020   NA 140 03/24/2020   K 4.9 03/24/2020   CL 101 03/24/2020   CREATININE 0.8 06/02/2020   BUN 13 03/24/2020   CO2 24 03/24/2020   TSH 3.670 06/18/2019   HGBA1C 6.2 06/02/2020       Assessment & Plan:   Problem List Items Addressed This  Visit    Anemia    Follow cbc.        Relevant Orders   CBC with Differential/Platelet   Hypercholesterolemia    On pravastatin.  Low cholesterol diet and exercise.  Follow lipid panel and liver function tests.        Relevant Orders   Hepatic function panel   Lipid panel   Stress    Continue citalopram. Overall appears to be handling things relatively well.  Follow.        Hyperglycemia    Low carb diet and exercise. Follow met b and a1c.       Relevant Orders   Hemoglobin A1c   Hypertension    Outside blood pressures reviewed.  Blood pressures averaging 120-130s/60-70s.  Continue lisinopril.  Follow pressures.  Follow metabolic panel.       Relevant Orders   TSH   Basic metabolic panel       Einar Pheasant, MD

## 2020-10-02 ENCOUNTER — Encounter: Payer: Self-pay | Admitting: Internal Medicine

## 2020-10-02 NOTE — Assessment & Plan Note (Signed)
Low carb diet and exercise. Follow met b and a1c.  

## 2020-10-02 NOTE — Assessment & Plan Note (Signed)
Follow cbc.  

## 2020-10-02 NOTE — Assessment & Plan Note (Signed)
Continue citalopram. Overall appears to be handling things relatively well.  Follow.

## 2020-10-02 NOTE — Assessment & Plan Note (Signed)
On pravastatin.  Low cholesterol diet and exercise.  Follow lipid panel and liver function tests.   

## 2020-10-02 NOTE — Assessment & Plan Note (Signed)
Outside blood pressures reviewed.  Blood pressures averaging 120-130s/60-70s.  Continue lisinopril.  Follow pressures.  Follow metabolic panel.

## 2020-11-23 ENCOUNTER — Other Ambulatory Visit (INDEPENDENT_AMBULATORY_CARE_PROVIDER_SITE_OTHER): Payer: Managed Care, Other (non HMO)

## 2020-11-23 ENCOUNTER — Other Ambulatory Visit: Payer: Self-pay

## 2020-11-23 DIAGNOSIS — R739 Hyperglycemia, unspecified: Secondary | ICD-10-CM

## 2020-11-23 DIAGNOSIS — D649 Anemia, unspecified: Secondary | ICD-10-CM

## 2020-11-23 DIAGNOSIS — I10 Essential (primary) hypertension: Secondary | ICD-10-CM

## 2020-11-23 DIAGNOSIS — E7800 Pure hypercholesterolemia, unspecified: Secondary | ICD-10-CM

## 2020-11-25 LAB — CBC WITH DIFFERENTIAL/PLATELET
Basophils Absolute: 0.1 10*3/uL (ref 0.0–0.2)
Basos: 1 %
EOS (ABSOLUTE): 0.2 10*3/uL (ref 0.0–0.4)
Eos: 2 %
Hematocrit: 45.3 % (ref 34.0–46.6)
Hemoglobin: 14.6 g/dL (ref 11.1–15.9)
Immature Grans (Abs): 0 10*3/uL (ref 0.0–0.1)
Immature Granulocytes: 0 %
Lymphocytes Absolute: 2.6 10*3/uL (ref 0.7–3.1)
Lymphs: 28 %
MCH: 30 pg (ref 26.6–33.0)
MCHC: 32.2 g/dL (ref 31.5–35.7)
MCV: 93 fL (ref 79–97)
Monocytes Absolute: 0.7 10*3/uL (ref 0.1–0.9)
Monocytes: 7 %
Neutrophils Absolute: 5.8 10*3/uL (ref 1.4–7.0)
Neutrophils: 62 %
Platelets: 365 10*3/uL (ref 150–450)
RBC: 4.87 x10E6/uL (ref 3.77–5.28)
RDW: 12.5 % (ref 11.7–15.4)
WBC: 9.4 10*3/uL (ref 3.4–10.8)

## 2020-11-25 LAB — HEMOGLOBIN A1C
Est. average glucose Bld gHb Est-mCnc: 134 mg/dL
Hgb A1c MFr Bld: 6.3 % — ABNORMAL HIGH (ref 4.8–5.6)

## 2020-11-25 LAB — LIPID PANEL
Chol/HDL Ratio: 3.5 ratio (ref 0.0–4.4)
Cholesterol, Total: 193 mg/dL (ref 100–199)
HDL: 55 mg/dL (ref >39)
LDL Chol Calc (NIH): 114 mg/dL — ABNORMAL HIGH (ref 0–99)
Triglycerides: 138 mg/dL (ref 0–149)
VLDL Cholesterol Cal: 24 mg/dL (ref 5–40)

## 2020-11-25 LAB — TSH: TSH: 4.93 u[IU]/mL — ABNORMAL HIGH (ref 0.450–4.500)

## 2020-11-25 LAB — BASIC METABOLIC PANEL
BUN/Creatinine Ratio: 15 (ref 9–23)
BUN: 14 mg/dL (ref 6–24)
CO2: 23 mmol/L (ref 20–29)
Calcium: 9.6 mg/dL (ref 8.7–10.2)
Chloride: 100 mmol/L (ref 96–106)
Creatinine, Ser: 0.92 mg/dL (ref 0.57–1.00)
GFR calc Af Amer: 81 mL/min/{1.73_m2} (ref >59)
GFR calc non Af Amer: 70 mL/min/{1.73_m2} (ref >59)
Glucose: 115 mg/dL — ABNORMAL HIGH (ref 65–99)
Potassium: 4.8 mmol/L (ref 3.5–5.2)
Sodium: 141 mmol/L (ref 134–144)

## 2020-11-25 LAB — HEPATIC FUNCTION PANEL
ALT: 19 IU/L (ref 0–32)
AST: 19 IU/L (ref 0–40)
Albumin: 4.5 g/dL (ref 3.8–4.9)
Alkaline Phosphatase: 97 IU/L (ref 44–121)
Bilirubin Total: 0.4 mg/dL (ref 0.0–1.2)
Bilirubin, Direct: 0.12 mg/dL (ref 0.00–0.40)
Total Protein: 7.2 g/dL (ref 6.0–8.5)

## 2020-11-28 ENCOUNTER — Other Ambulatory Visit: Payer: Self-pay

## 2020-11-28 MED ORDER — LEVOTHYROXINE SODIUM 75 MCG PO TABS: 75.0000 ug | ORAL_TABLET | Freq: Every day | ORAL | 0 refills | Status: DC

## 2020-12-30 ENCOUNTER — Ambulatory Visit (INDEPENDENT_AMBULATORY_CARE_PROVIDER_SITE_OTHER): Payer: Managed Care, Other (non HMO) | Admitting: Internal Medicine

## 2020-12-30 ENCOUNTER — Encounter: Payer: Self-pay | Admitting: Internal Medicine

## 2020-12-30 ENCOUNTER — Other Ambulatory Visit: Payer: Self-pay

## 2020-12-30 DIAGNOSIS — F439 Reaction to severe stress, unspecified: Secondary | ICD-10-CM

## 2020-12-30 DIAGNOSIS — E7800 Pure hypercholesterolemia, unspecified: Secondary | ICD-10-CM

## 2020-12-30 DIAGNOSIS — I10 Essential (primary) hypertension: Secondary | ICD-10-CM

## 2020-12-30 DIAGNOSIS — R739 Hyperglycemia, unspecified: Secondary | ICD-10-CM

## 2020-12-30 DIAGNOSIS — D649 Anemia, unspecified: Secondary | ICD-10-CM

## 2020-12-30 LAB — HM DIABETES FOOT EXAM

## 2020-12-30 MED ORDER — BUSPIRONE HCL 5 MG PO TABS: 5.0000 mg | ORAL_TABLET | Freq: Two times a day (BID) | ORAL | 0 refills | Status: DC | PRN

## 2020-12-30 NOTE — Progress Notes (Signed)
Patient ID: Kristen Aguilar, female   DOB: 02-Feb-1966, 55 y.o.   MRN: [id number]   Subjective:    Patient ID: Kristen Aguilar, female    DOB: 26-Nov-1965, 55 y.o.   MRN: [id number]  HPI This visit occurred during the SARS-CoV-2 public health emergency.  Safety protocols were in place, including screening questions prior to the visit, additional usage of staff PPE, and extensive cleaning of exam room while observing appropriate contact time as indicated for disinfecting solutions.  Patient here for a scheduled follow up.  Here to follow up regarding her blood pressure and blood sugar.  Reports increased stress.  Family stress. Discussed.  Feels citalopram is helping, but feels she needs something more to help with the stress.  Did not tolerate increasing citalopram.  Discussed buspar.  She is agreeable to start.  No chest pain or sob reported.  No abdominal pain or bowel change reported.  Had eye exam Tuesday - Dr Marvel Plan.  States eyes checked out ok.  Discussed diet and exercise.    Past Medical History:  Diagnosis Date  . Allergy   . Anemia    as a child  . Anxiety   . Depression   . Gestational diabetes   . Hypertension   . Pre-diabetes    Past Surgical History:  Procedure Laterality Date  . CESAREAN SECTION  2001 & 2004  . COLONOSCOPY WITH PROPOFOL N/A 06/09/2018   Procedure: COLONOSCOPY WITH PROPOFOL;  Surgeon: Jonathon Bellows, MD;  Location: [facility] ENDOSCOPY;  Service: Gastroenterology;  Laterality: N/A;   Family History  Problem Relation Age of Onset  . Hypertension Mother   . Hypertension Father   . Hypertension Maternal Grandmother   . Diabetes Maternal Grandmother   . Breast cancer Paternal Grandmother    Social History   Socioeconomic History  . Marital status: Married    Spouse name: Not on file  . Number of children: 2  . Years of education: Not on file  . Highest education level: Not on file  Occupational History  . Not on file  Tobacco Use  . Smoking status:  Never Smoker  . Smokeless tobacco: Never Used  Vaping Use  . Vaping Use: Never used  Substance and Sexual Activity  . Alcohol use: No    Alcohol/week: 0.0 standard drinks  . Drug use: No  . Sexual activity: Not on file  Other Topics Concern  . Not on file  Social History Narrative  . Not on file   Social Determinants of Health   Financial Resource Strain: Not on file  Food Insecurity: Not on file  Transportation Needs: Not on file  Physical Activity: Not on file  Stress: Not on file  Social Connections: Not on file    Outpatient Encounter Medications as of 12/30/2020  Medication Sig  . cetirizine (ZYRTEC) 10 MG tablet Take 10 mg by mouth daily.  . citalopram (CELEXA) 20 MG tablet TAKE 1 AND 1/2 TABLETS BY  MOUTH DAILY  . Coenzyme Q10 (CO Q 10) 100 MG CAPS Take 50 mg by mouth daily.   Marland Kitchen ELDERBERRY PO Take 50 mg by mouth daily.  . fish oil-omega-3 fatty acids 1000 MG capsule Take 2 g by mouth daily.  . fluticasone (FLONASE) 50 MCG/ACT nasal spray USE 2 SPRAYS IN EACH  NOSTRIL DAILY  . GARLIC PO Take by mouth.  . levothyroxine (SYNTHROID) 75 MCG tablet Take 1 tablet (75 mcg total) by mouth daily before breakfast.  . lisinopril (ZESTRIL) 20 MG  tablet TAKE 1 TABLET BY MOUTH ONCE DAILY  . Multiple Vitamin (MULTIVITAMIN WITH MINERALS) TABS Take 1 tablet by mouth daily.  . Multiple Vitamin (MULTIVITAMIN) tablet Take 1 tablet by mouth daily.  . pravastatin (PRAVACHOL) 40 MG tablet TAKE 1 TABLET BY MOUTH  DAILY  . [DISCONTINUED] busPIRone (BUSPAR) 5 MG tablet Take 1 tablet (5 mg total) by mouth 2 (two) times daily as needed.  . busPIRone (BUSPAR) 5 MG tablet Take 1 tablet (5 mg total) by mouth 2 (two) times daily as needed.   No facility-administered encounter medications on file as of 12/30/2020.    Review of Systems  Constitutional: Negative for appetite change and unexpected weight change.  HENT: Negative for congestion and sinus pressure.   Respiratory: Negative for cough,  chest tightness and shortness of breath.   Cardiovascular: Negative for chest pain, palpitations and leg swelling.  Gastrointestinal: Negative for abdominal pain, diarrhea, nausea and vomiting.  Genitourinary: Negative for difficulty urinating and dysuria.  Musculoskeletal: Negative for joint swelling and myalgias.  Skin: Negative for color change and rash.  Neurological: Negative for dizziness, light-headedness and headaches.  Psychiatric/Behavioral: Negative for agitation and dysphoric mood.       Objective:    Physical Exam Vitals reviewed.  Constitutional:      General: She is not in acute distress.    Appearance: Normal appearance.  HENT:     Head: Normocephalic and atraumatic.     Right Ear: External ear normal.     Left Ear: External ear normal.  Eyes:     General: No scleral icterus.       Right eye: No discharge.        Left eye: No discharge.     Conjunctiva/sclera: Conjunctivae normal.  Neck:     Thyroid: No thyromegaly.  Cardiovascular:     Rate and Rhythm: Normal rate and regular rhythm.  Pulmonary:     Effort: No respiratory distress.     Breath sounds: Normal breath sounds. No wheezing.  Abdominal:     General: Bowel sounds are normal.     Palpations: Abdomen is soft.     Tenderness: There is no abdominal tenderness.  Musculoskeletal:        General: No swelling or tenderness.     Cervical back: Neck supple. No tenderness.  Lymphadenopathy:     Cervical: No cervical adenopathy.  Skin:    Findings: No erythema or rash.  Neurological:     Mental Status: She is alert.  Psychiatric:        Mood and Affect: Mood normal.        Behavior: Behavior normal.     BP 126/72   Pulse 82   Temp 98 F (36.7 C) (Oral)   Ht '5\' 4"'  (1.626 m)   Wt 230 lb (104.3 kg)   SpO2 97%   BMI 39.48 kg/m  Wt Readings from Last 3 Encounters:  12/30/20 230 lb (104.3 kg)  09/22/20 225 lb 3.2 oz (102.2 kg)  07/12/20 230 lb 6.4 oz (104.5 kg)     Lab Results  Component  Value Date   WBC 9.4 11/23/2020   HGB 14.6 11/23/2020   HCT 45.3 11/23/2020   PLT 365 11/23/2020   GLUCOSE 115 (H) 11/23/2020   CHOL 193 11/23/2020   TRIG 138 11/23/2020   HDL 55 11/23/2020   LDLDIRECT 135.3 07/20/2014   LDLCALC 114 (H) 11/23/2020   ALT 19 11/23/2020   AST 19 11/23/2020   NA 141 11/23/2020  K 4.8 11/23/2020   CL 100 11/23/2020   CREATININE 0.92 11/23/2020   BUN 14 11/23/2020   CO2 23 11/23/2020   TSH 4.930 (H) 11/23/2020   HGBA1C 6.3 (H) 11/23/2020       Assessment & Plan:   Problem List Items Addressed This Visit    Anemia    Follow cbc.       Hypercholesterolemia    On pravastatin.  Low cholesterol diet and exercise.  Follow lipid panel and liver function tests.        Relevant Orders   Hepatic function panel   Lipid panel   TSH   Hyperglycemia    Low carb diet and exercise.  Follow met b and a1c.       Relevant Orders   Hemoglobin A1c   Hypertension    Blood pressure on recheck improved.  Continue lisinopril.  Follow pressures.  Follow metabolic panel.       Relevant Orders   Basic metabolic panel   Stress    Increased stress as outlined.  On citalopram.  Continue.  Add buspar 56m bid prn.  Follow.            CEinar Pheasant MD

## 2020-12-31 ENCOUNTER — Encounter: Payer: Self-pay | Admitting: Internal Medicine

## 2020-12-31 MED ORDER — BUSPIRONE HCL 5 MG PO TABS: 5.0000 mg | ORAL_TABLET | Freq: Two times a day (BID) | ORAL | 0 refills | Status: DC | PRN

## 2020-12-31 NOTE — Assessment & Plan Note (Signed)
Increased stress as outlined.  On citalopram.  Continue.  Add buspar 5mg  bid prn.  Follow.

## 2020-12-31 NOTE — Assessment & Plan Note (Signed)
On pravastatin.  Low cholesterol diet and exercise.  Follow lipid panel and liver function tests.   

## 2020-12-31 NOTE — Assessment & Plan Note (Signed)
Follow cbc.  

## 2020-12-31 NOTE — Assessment & Plan Note (Signed)
Blood pressure on recheck improved.  Continue lisinopril.  Follow pressures.  Follow metabolic panel.  

## 2020-12-31 NOTE — Assessment & Plan Note (Signed)
Low carb diet and exercise.  Follow met b and a1c.  

## 2021-01-16 ENCOUNTER — Other Ambulatory Visit (INDEPENDENT_AMBULATORY_CARE_PROVIDER_SITE_OTHER): Payer: Managed Care, Other (non HMO)

## 2021-01-16 ENCOUNTER — Other Ambulatory Visit: Payer: Self-pay

## 2021-01-16 DIAGNOSIS — E7800 Pure hypercholesterolemia, unspecified: Secondary | ICD-10-CM

## 2021-01-16 DIAGNOSIS — R739 Hyperglycemia, unspecified: Secondary | ICD-10-CM

## 2021-01-16 DIAGNOSIS — I10 Essential (primary) hypertension: Secondary | ICD-10-CM

## 2021-01-16 NOTE — Addendum Note (Signed)
Addended by: Warden Fillers on: 01/16/2021 11:20 AM   Modules accepted: Orders

## 2021-01-17 LAB — TSH: TSH: 1.71 u[IU]/mL (ref 0.450–4.500)

## 2021-02-24 ENCOUNTER — Other Ambulatory Visit: Payer: Self-pay | Admitting: Internal Medicine

## 2021-03-17 ENCOUNTER — Other Ambulatory Visit: Payer: Managed Care, Other (non HMO)

## 2021-03-21 ENCOUNTER — Ambulatory Visit: Payer: Managed Care, Other (non HMO) | Admitting: Internal Medicine

## 2021-03-27 ENCOUNTER — Other Ambulatory Visit: Payer: Self-pay

## 2021-03-27 ENCOUNTER — Other Ambulatory Visit (INDEPENDENT_AMBULATORY_CARE_PROVIDER_SITE_OTHER): Payer: Managed Care, Other (non HMO)

## 2021-03-27 DIAGNOSIS — R739 Hyperglycemia, unspecified: Secondary | ICD-10-CM

## 2021-03-27 DIAGNOSIS — I10 Essential (primary) hypertension: Secondary | ICD-10-CM

## 2021-03-27 DIAGNOSIS — E7800 Pure hypercholesterolemia, unspecified: Secondary | ICD-10-CM

## 2021-03-28 ENCOUNTER — Ambulatory Visit: Payer: Managed Care, Other (non HMO) | Admitting: Internal Medicine

## 2021-03-28 VITALS — BP 130/78 | HR 84 | Temp 98.1°F | Resp 16 | Ht 64.0 in | Wt 230.0 lb

## 2021-03-28 DIAGNOSIS — I10 Essential (primary) hypertension: Secondary | ICD-10-CM | POA: Diagnosis not present

## 2021-03-28 DIAGNOSIS — D649 Anemia, unspecified: Secondary | ICD-10-CM

## 2021-03-28 DIAGNOSIS — R739 Hyperglycemia, unspecified: Secondary | ICD-10-CM

## 2021-03-28 DIAGNOSIS — R200 Anesthesia of skin: Secondary | ICD-10-CM | POA: Insufficient documentation

## 2021-03-28 DIAGNOSIS — Z8632 Personal history of gestational diabetes: Secondary | ICD-10-CM

## 2021-03-28 DIAGNOSIS — E7800 Pure hypercholesterolemia, unspecified: Secondary | ICD-10-CM | POA: Diagnosis not present

## 2021-03-28 DIAGNOSIS — F439 Reaction to severe stress, unspecified: Secondary | ICD-10-CM | POA: Diagnosis not present

## 2021-03-28 LAB — HEPATIC FUNCTION PANEL
ALT: 24 IU/L (ref 0–32)
AST: 22 IU/L (ref 0–40)
Albumin: 4.4 g/dL (ref 3.8–4.9)
Alkaline Phosphatase: 96 IU/L (ref 44–121)
Bilirubin Total: 0.6 mg/dL (ref 0.0–1.2)
Bilirubin, Direct: 0.16 mg/dL (ref 0.00–0.40)
Total Protein: 7.2 g/dL (ref 6.0–8.5)

## 2021-03-28 LAB — LIPID PANEL
Chol/HDL Ratio: 3.8 ratio (ref 0.0–4.4)
Cholesterol, Total: 189 mg/dL (ref 100–199)
HDL: 50 mg/dL (ref >39)
LDL Chol Calc (NIH): 111 mg/dL — ABNORMAL HIGH (ref 0–99)
Triglycerides: 162 mg/dL — ABNORMAL HIGH (ref 0–149)
VLDL Cholesterol Cal: 28 mg/dL (ref 5–40)

## 2021-03-28 LAB — BASIC METABOLIC PANEL
BUN/Creatinine Ratio: 12 (ref 9–23)
BUN: 11 mg/dL (ref 6–24)
CO2: 23 mmol/L (ref 20–29)
Calcium: 9.5 mg/dL (ref 8.7–10.2)
Chloride: 103 mmol/L (ref 96–106)
Creatinine, Ser: 0.9 mg/dL (ref 0.57–1.00)
Glucose: 102 mg/dL — ABNORMAL HIGH (ref 65–99)
Potassium: 4.9 mmol/L (ref 3.5–5.2)
Sodium: 141 mmol/L (ref 134–144)
eGFR: 75 mL/min/{1.73_m2} (ref >59)

## 2021-03-28 LAB — HEMOGLOBIN A1C
Est. average glucose Bld gHb Est-mCnc: 134 mg/dL
Hgb A1c MFr Bld: 6.3 % — ABNORMAL HIGH (ref 4.8–5.6)

## 2021-03-28 MED ORDER — ROSUVASTATIN CALCIUM 20 MG PO TABS: 20.0000 mg | ORAL_TABLET | Freq: Every day | ORAL | 3 refills | Status: DC

## 2021-03-28 NOTE — Progress Notes (Signed)
Patient ID: Kristen Aguilar, female   DOB: May 18, 1966, 55 y.o.   MRN: [id number]   Subjective:    Patient ID: Kristen Aguilar, female    DOB: [birth year], 55 y.o.   MRN: [id number]  HPI This visit occurred during the SARS-CoV-2 public health emergency.  Safety protocols were in place, including screening questions prior to the visit, additional usage of staff PPE, and extensive cleaning of exam room while observing appropriate contact time as indicated for disinfecting solutions.   Patient here for a scheduled follow up.  Here to follow up regarding increased stress/anxiety, cholesterol, blood pressure and blood sugar.  She reports increased stress with her father's medical issues.  Overall she feels she is doing relatively well.  Has only had to take buspar on one occasion.  Continues on citalopram and feels this is working well.  No chest pain or sob reported.  Diagnosed with covid 03/08/21.  No residual symptoms.  No increased cough or congestion.  No nausea or vomiting.  Bowels moving.  Does report persistent intermittent right foot numbness.  Notices at night.  Only right - not left.  Would like further evaluation given her father's history.    Past Medical History:  Diagnosis Date   Allergy    Anemia    as a child   Anxiety    Depression    Gestational diabetes    Hypertension    Pre-diabetes    Past Surgical History:  Procedure Laterality Date   CESAREAN SECTION  2001 & 2004   COLONOSCOPY WITH PROPOFOL N/A 06/09/2018   Procedure: COLONOSCOPY WITH PROPOFOL;  Surgeon: Jonathon Bellows, MD;  Location: [facility] ENDOSCOPY;  Service: Gastroenterology;  Laterality: N/A;   Family History  Problem Relation Age of Onset   Hypertension Mother    Hypertension Father    Hypertension Maternal Grandmother    Diabetes Maternal Grandmother    Breast cancer Paternal Grandmother    Social History   Socioeconomic History   Marital status: Married    Spouse name: Not on file   Number of children: 2    Years of education: Not on file   Highest education level: Not on file  Occupational History   Not on file  Tobacco Use   Smoking status: Never   Smokeless tobacco: Never  Vaping Use   Vaping Use: Never used  Substance and Sexual Activity   Alcohol use: No    Alcohol/week: 0.0 standard drinks   Drug use: No   Sexual activity: Not on file  Other Topics Concern   Not on file  Social History Narrative   Not on file   Social Determinants of Health   Financial Resource Strain: Not on file  Food Insecurity: Not on file  Transportation Needs: Not on file  Physical Activity: Not on file  Stress: Not on file  Social Connections: Not on file    Review of Systems  Constitutional:  Negative for appetite change and unexpected weight change.  HENT:  Negative for congestion and sinus pressure.   Respiratory:  Negative for cough, chest tightness and shortness of breath.   Cardiovascular:  Negative for chest pain and palpitations.  Gastrointestinal:  Negative for abdominal pain, diarrhea, nausea and vomiting.  Genitourinary:  Negative for difficulty urinating and dysuria.  Musculoskeletal:  Negative for joint swelling and myalgias.  Skin:  Negative for color change and rash.  Neurological:  Negative for dizziness, light-headedness and headaches.       Right foot numbness as  outlined.   Psychiatric/Behavioral:  Negative for agitation and dysphoric mood.       Objective:    Physical Exam Vitals reviewed.  Constitutional:      General: She is not in acute distress.    Appearance: Normal appearance.  HENT:     Head: Normocephalic and atraumatic.     Right Ear: External ear normal.     Left Ear: External ear normal.  Eyes:     General: No scleral icterus.       Right eye: No discharge.        Left eye: No discharge.     Conjunctiva/sclera: Conjunctivae normal.  Neck:     Thyroid: No thyromegaly.  Cardiovascular:     Rate and Rhythm: Normal rate and regular rhythm.   Pulmonary:     Effort: No respiratory distress.     Breath sounds: Normal breath sounds. No wheezing.  Abdominal:     General: Bowel sounds are normal.     Palpations: Abdomen is soft.     Tenderness: There is no abdominal tenderness.  Musculoskeletal:        General: No swelling or tenderness.     Cervical back: Neck supple. No tenderness.  Lymphadenopathy:     Cervical: No cervical adenopathy.  Skin:    Findings: No erythema or rash.  Neurological:     Mental Status: She is alert.  Psychiatric:        Mood and Affect: Mood normal.        Behavior: Behavior normal.    BP 130/78   Pulse 84   Temp 98.1 F (36.7 C)   Resp 16   Ht _0  (1.626 m)   Wt 230 lb (104.3 kg)   SpO2 98%   BMI 39.48 kg/m  Wt Readings from Last 3 Encounters:  03/28/21 230 lb (104.3 kg)  12/30/20 230 lb (104.3 kg)  09/22/20 225 lb 3.2 oz (102.2 kg)     Lab Results  Component Value Date   WBC 9.4 11/23/2020   HGB 14.6 11/23/2020   HCT 45.3 11/23/2020   PLT 365 11/23/2020   GLUCOSE 102 (H) 03/27/2021   CHOL 189 03/27/2021   TRIG 162 (H) 03/27/2021   HDL 50 03/27/2021   LDLDIRECT 135.3 07/20/2014   LDLCALC 111 (H) 03/27/2021   ALT 24 03/27/2021   AST 22 03/27/2021   NA 141 03/27/2021   K 4.9 03/27/2021   CL 103 03/27/2021   CREATININE 0.90 03/27/2021   BUN 11 03/27/2021   CO2 23 03/27/2021   TSH 1.710 01/16/2021   HGBA1C 6.3 (H) 03/27/2021       Assessment & Plan:   Problem List Items Addressed This Visit     Anemia    Follow cbc.         History of gestational diabetes    Follow met b and a1c.        Hypercholesterolemia - Primary    Currently on pravastatin.  Low cholesterol diet and exercise.  Follow lipid panel and liver function tests.  Will change to crestor 57m q day.         Relevant Medications   rosuvastatin (CRESTOR) 20 MG tablet   Other Relevant Orders   CBC with Differential/Platelet   Hepatic function panel   Hyperglycemia    Low carb diet and  exercise.  Follow met b and a1c        Hypertension    Blood pressure on recheck improved.  Continue lisinopril.  Follow pressures.  Follow metabolic panel.        Relevant Medications   rosuvastatin (CRESTOR) 20 MG tablet   Numbness of right foot    Persistent intermittent numbness of right foot.  Father has history of neuropathy, etc.  She request nerve conduction studies to further evaluate.  Check B12 and routine lab.        Relevant Orders   Vitamin B12   Ambulatory referral to Neurology   Stress    Increased stress as outlined.  On citalopram.  Continue.  Has buspar 39m bid prn.  Has only taken x 1.  Follow.           CEinar Pheasant MD

## 2021-04-02 ENCOUNTER — Encounter: Payer: Self-pay | Admitting: Internal Medicine

## 2021-04-02 NOTE — Assessment & Plan Note (Signed)
Increased stress as outlined.  On citalopram.  Continue.  Has buspar 5mg  bid prn.  Has only taken x 1.  Follow.

## 2021-04-02 NOTE — Assessment & Plan Note (Addendum)
Currently on pravastatin.  Low cholesterol diet and exercise.  Follow lipid panel and liver function tests.  Will change to crestor 20mg  q day.

## 2021-04-02 NOTE — Assessment & Plan Note (Signed)
Follow met b and a1c.  

## 2021-04-02 NOTE — Assessment & Plan Note (Signed)
Persistent intermittent numbness of right foot.  Father has history of neuropathy, etc.  She request nerve conduction studies to further evaluate.  Check B12 and routine lab.

## 2021-04-02 NOTE — Assessment & Plan Note (Signed)
Follow cbc.  

## 2021-04-02 NOTE — Assessment & Plan Note (Signed)
Blood pressure on recheck improved.  Continue lisinopril.  Follow pressures.  Follow metabolic panel.  

## 2021-04-02 NOTE — Assessment & Plan Note (Signed)
Low carb diet and exercise.  Follow met b and a1c.   

## 2021-04-27 ENCOUNTER — Other Ambulatory Visit: Payer: Self-pay

## 2021-04-27 ENCOUNTER — Telehealth: Payer: Self-pay

## 2021-04-27 DIAGNOSIS — R7989 Other specified abnormal findings of blood chemistry: Secondary | ICD-10-CM

## 2021-04-27 MED ORDER — LEVOTHYROXINE SODIUM 75 MCG PO TABS: ORAL_TABLET | ORAL | 1 refills | Status: DC

## 2021-04-27 NOTE — Telephone Encounter (Signed)
Per record review, TSH on last check was wnl. (Previous check slightly elevated and dose changed to q day of synthroid).  Confirm on synthroid q day and if so, ok to refill.  Let me know if questions or if not on this dose, clarify how taking.

## 2021-04-27 NOTE — Telephone Encounter (Signed)
LMTCB

## 2021-04-27 NOTE — Telephone Encounter (Signed)
I called patient & she stated that she taking levothyroxine 75 mcg daily one hour before breakfast. I have sent to [facility].  She wanted me to let you know that she was advised last week when she called about right knee pain to go to [facility]. She did go & her arthritis has flared along with a possible small tear. They gave her mobic 15 mg & it has helped her tremendously. She will call if she needs anything further, but was please with her care at [facility].

## 2021-05-09 ENCOUNTER — Other Ambulatory Visit (INDEPENDENT_AMBULATORY_CARE_PROVIDER_SITE_OTHER): Payer: Managed Care, Other (non HMO)

## 2021-05-09 ENCOUNTER — Other Ambulatory Visit: Payer: Self-pay

## 2021-05-09 DIAGNOSIS — R200 Anesthesia of skin: Secondary | ICD-10-CM

## 2021-05-09 DIAGNOSIS — E7800 Pure hypercholesterolemia, unspecified: Secondary | ICD-10-CM

## 2021-05-10 LAB — HEPATIC FUNCTION PANEL
ALT: 22 IU/L (ref 0–32)
AST: 18 IU/L (ref 0–40)
Albumin: 4.3 g/dL (ref 3.8–4.9)
Alkaline Phosphatase: 86 IU/L (ref 44–121)
Bilirubin Total: 0.4 mg/dL (ref 0.0–1.2)
Bilirubin, Direct: 0.13 mg/dL (ref 0.00–0.40)
Total Protein: 6.7 g/dL (ref 6.0–8.5)

## 2021-05-10 LAB — CBC WITH DIFFERENTIAL/PLATELET
Basophils Absolute: 0.1 10*3/uL (ref 0.0–0.2)
Basos: 1 %
EOS (ABSOLUTE): 0.5 10*3/uL — ABNORMAL HIGH (ref 0.0–0.4)
Eos: 5 %
Hematocrit: 38.3 % (ref 34.0–46.6)
Hemoglobin: 12.9 g/dL (ref 11.1–15.9)
Immature Grans (Abs): 0 10*3/uL (ref 0.0–0.1)
Immature Granulocytes: 0 %
Lymphocytes Absolute: 2.4 10*3/uL (ref 0.7–3.1)
Lymphs: 25 %
MCH: 29.5 pg (ref 26.6–33.0)
MCHC: 33.7 g/dL (ref 31.5–35.7)
MCV: 87 fL (ref 79–97)
Monocytes Absolute: 0.6 10*3/uL (ref 0.1–0.9)
Monocytes: 6 %
Neutrophils Absolute: 5.9 10*3/uL (ref 1.4–7.0)
Neutrophils: 63 %
Platelets: 298 10*3/uL (ref 150–450)
RBC: 4.38 x10E6/uL (ref 3.77–5.28)
RDW: 12.3 % (ref 11.7–15.4)
WBC: 9.4 10*3/uL (ref 3.4–10.8)

## 2021-05-10 LAB — VITAMIN B12: Vitamin B-12: 572 pg/mL (ref 232–1245)

## 2021-05-26 ENCOUNTER — Other Ambulatory Visit: Payer: Self-pay | Admitting: Internal Medicine

## 2021-06-27 DIAGNOSIS — R200 Anesthesia of skin: Secondary | ICD-10-CM | POA: Insufficient documentation

## 2021-06-27 DIAGNOSIS — R202 Paresthesia of skin: Secondary | ICD-10-CM | POA: Insufficient documentation

## 2021-06-30 ENCOUNTER — Other Ambulatory Visit: Payer: Self-pay | Admitting: Internal Medicine

## 2021-07-01 ENCOUNTER — Other Ambulatory Visit: Payer: Self-pay | Admitting: Internal Medicine

## 2021-07-04 IMAGING — DX DG KNEE 1-2V*R*
2 series · 2 of 2 positions shown · non-contrast
Comparison: None.

CLINICAL DATA: Right knee pain.

EXAM:
RIGHT KNEE - 1-2 VIEW

[knee ap]
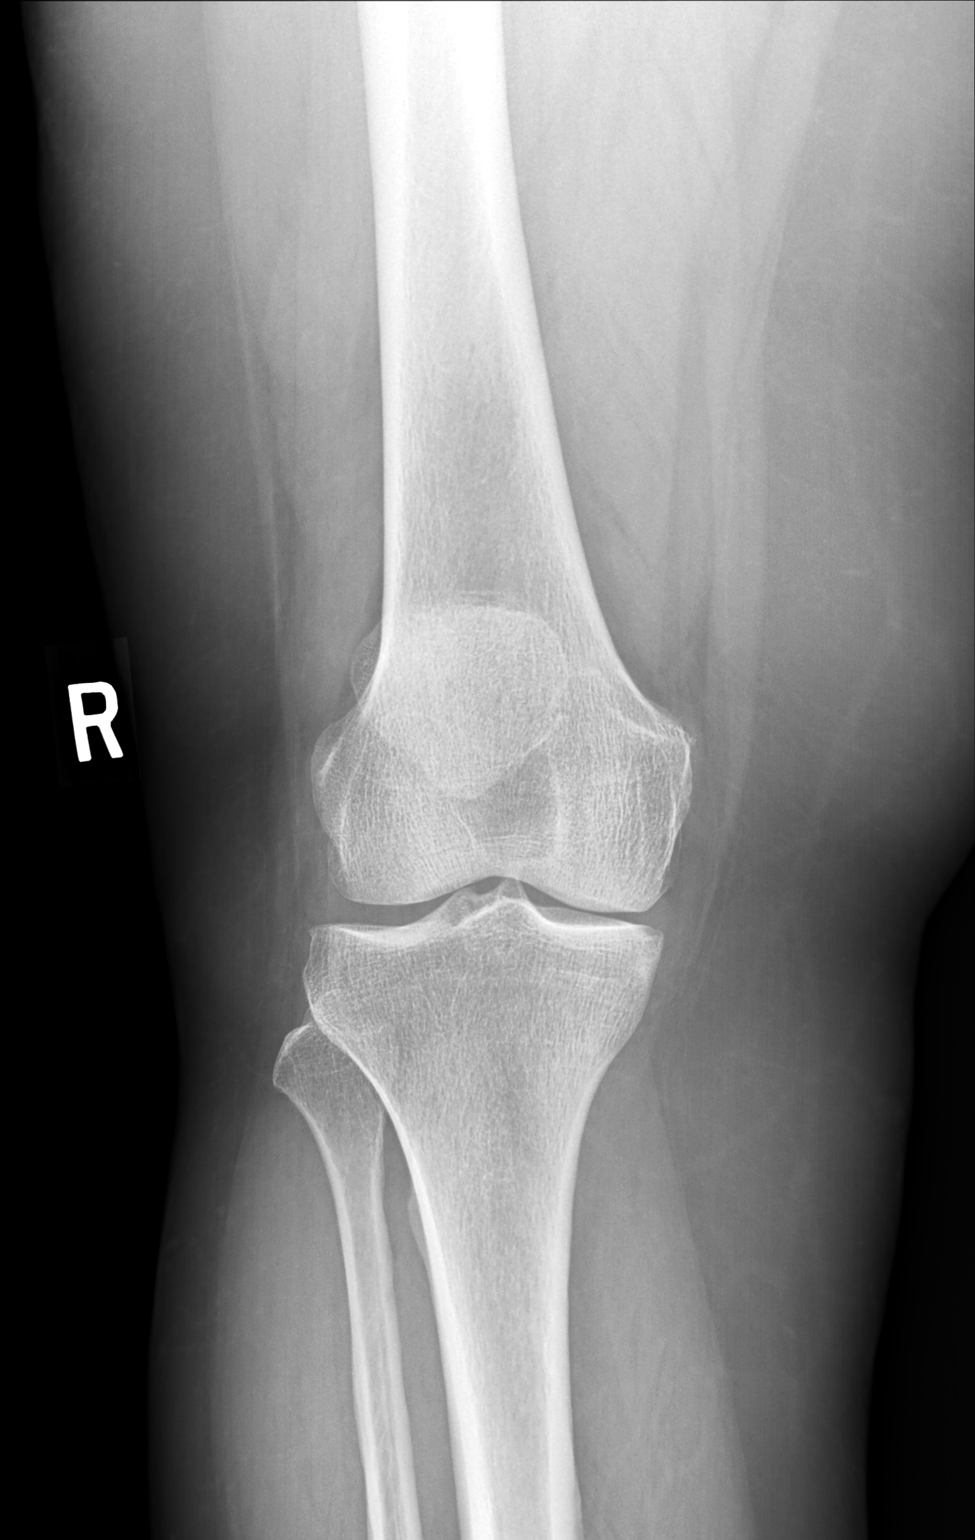

[knee lat]
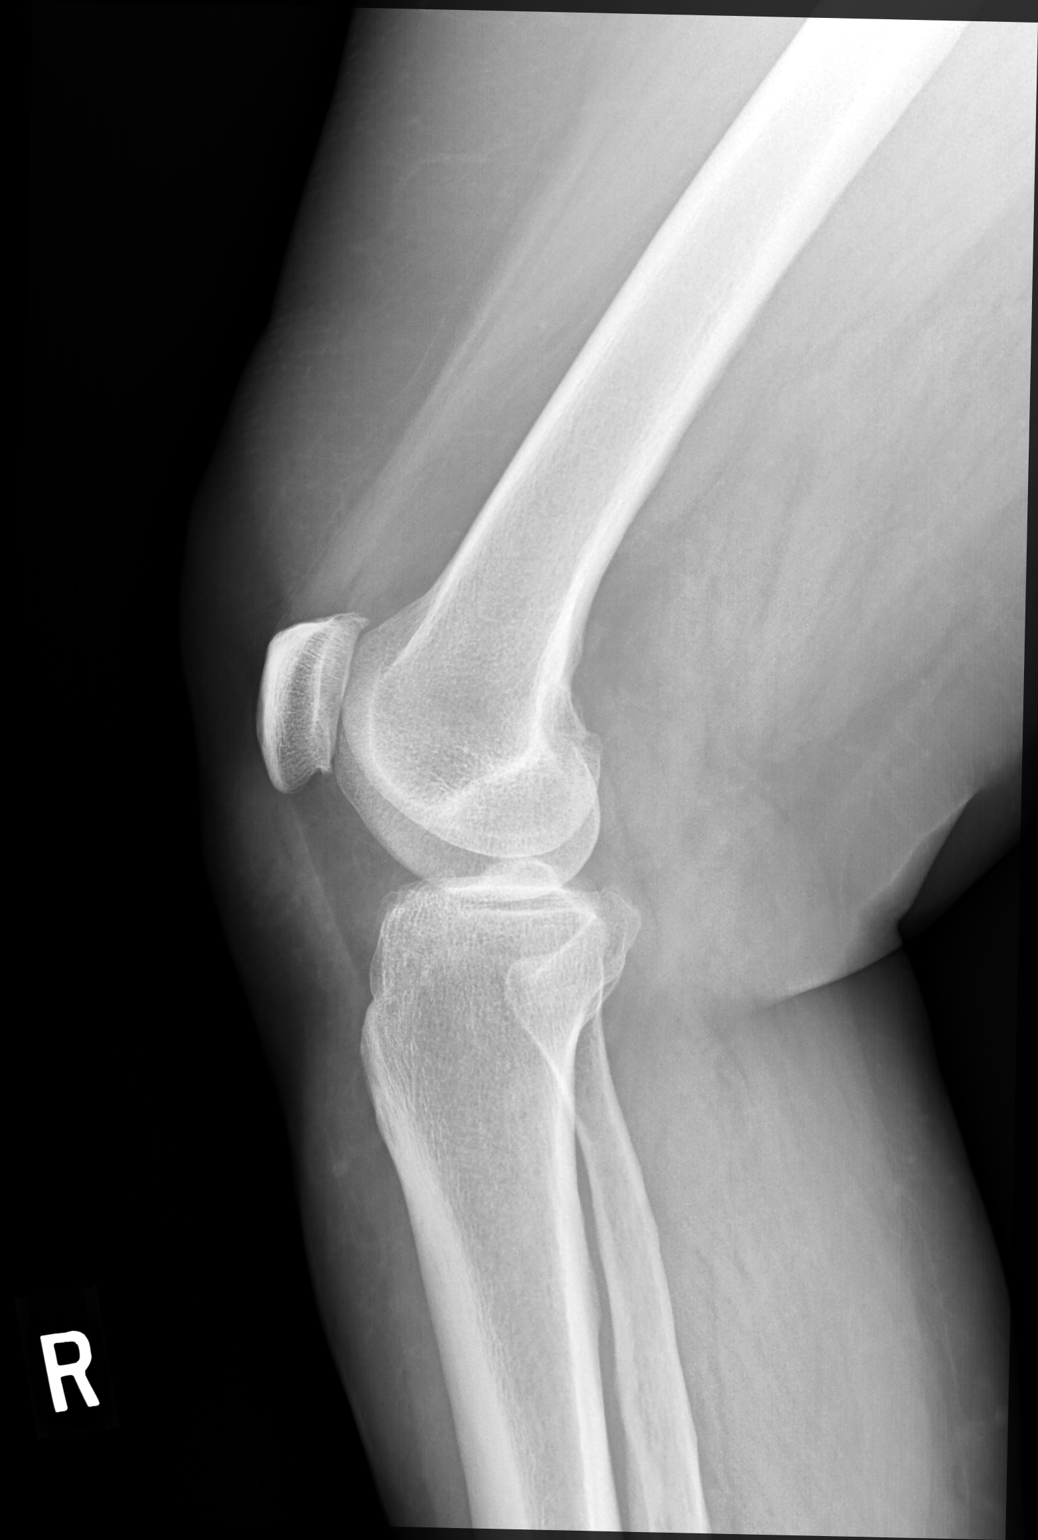

[2 of 2 positions shown; findings below may reference images not displayed]

FINDINGS: No evidence of fracture, dislocation, or joint effusion. Mild
narrowing of medial joint space is noted. Soft tissues are
unremarkable.
IMPRESSION: Mild degenerative joint disease. No acute abnormality seen in the
right knee.

## 2021-07-10 LAB — HM MAMMOGRAPHY

## 2021-07-13 ENCOUNTER — Encounter: Payer: Self-pay | Admitting: Internal Medicine

## 2021-07-31 ENCOUNTER — Ambulatory Visit (INDEPENDENT_AMBULATORY_CARE_PROVIDER_SITE_OTHER): Payer: Managed Care, Other (non HMO) | Admitting: Internal Medicine

## 2021-07-31 ENCOUNTER — Other Ambulatory Visit: Payer: Self-pay

## 2021-07-31 VITALS — BP 130/72 | HR 94 | Temp 96.7°F | Resp 16 | Ht 64.0 in | Wt 236.0 lb

## 2021-07-31 DIAGNOSIS — Z23 Encounter for immunization: Secondary | ICD-10-CM

## 2021-07-31 DIAGNOSIS — D649 Anemia, unspecified: Secondary | ICD-10-CM

## 2021-07-31 DIAGNOSIS — E7800 Pure hypercholesterolemia, unspecified: Secondary | ICD-10-CM | POA: Diagnosis not present

## 2021-07-31 DIAGNOSIS — F439 Reaction to severe stress, unspecified: Secondary | ICD-10-CM

## 2021-07-31 DIAGNOSIS — M25561 Pain in right knee: Secondary | ICD-10-CM

## 2021-07-31 DIAGNOSIS — I10 Essential (primary) hypertension: Secondary | ICD-10-CM | POA: Diagnosis not present

## 2021-07-31 DIAGNOSIS — Z0000 Encounter for general adult medical examination without abnormal findings: Secondary | ICD-10-CM

## 2021-07-31 DIAGNOSIS — R739 Hyperglycemia, unspecified: Secondary | ICD-10-CM | POA: Diagnosis not present

## 2021-07-31 DIAGNOSIS — R200 Anesthesia of skin: Secondary | ICD-10-CM

## 2021-07-31 NOTE — Assessment & Plan Note (Signed)
Physical today 07/31/21.  PAP 06/18/19 - negative with negative HPV.  Colonoscopy 05/2018.  Recommended f/u in 10 years.

## 2021-07-31 NOTE — Progress Notes (Signed)
Patient ID: Kristen Aguilar, female   DOB: [birth year], 55 y.o.   MRN: [id number]   Subjective:    Patient ID: Kristen Aguilar, female    DOB: 12/11/1965, 55 y.o.   MRN: [id number]  This visit occurred during the SARS-CoV-2 public health emergency.  Safety protocols were in place, including screening questions prior to the visit, additional usage of staff PPE, and extensive cleaning of exam room while observing appropriate contact time as indicated for disinfecting solutions.   Patient here for her physical exam.   Chief Complaint  Patient presents with   Annual Exam   .   HPI Recently evaluated (06/20/21) by neurology.  Evaluated for numbness and tingling - right toes.  Recommended taking baby aspirin and starting gabapentin.  Recommended f/u in 2-3 months.  Gabapentin - caused fatigue.  Main complaint today is that of right knee pain.  Pain - just below knee.  Sharp pain at times.  Walking down incline worse.  Hard to bend and straighten.  Has seen ortho.  Has had xray.  Also taking meloxicam.  Still persistent problem.  Discussed MRI.  No chest pain or sob reported.  No abdominal pain or bowel change reported.    Past Medical History:  Diagnosis Date   Allergy    Anemia    as a child   Anxiety    Depression    Gestational diabetes    Hypertension    Pre-diabetes    Past Surgical History:  Procedure Laterality Date   CESAREAN SECTION  2001 & 2004   COLONOSCOPY WITH PROPOFOL N/A 06/09/2018   Procedure: COLONOSCOPY WITH PROPOFOL;  Surgeon: Jonathon Bellows, MD;  Location: [facility] ENDOSCOPY;  Service: Gastroenterology;  Laterality: N/A;   Family History  Problem Relation Age of Onset   Hypertension Mother    Hypertension Father    Hypertension Maternal Grandmother    Diabetes Maternal Grandmother    Breast cancer Paternal Grandmother    Social History   Socioeconomic History   Marital status: Married    Spouse name: Not on file   Number of children: 2   Years of education: Not  on file   Highest education level: Not on file  Occupational History   Not on file  Tobacco Use   Smoking status: Never   Smokeless tobacco: Never  Vaping Use   Vaping Use: Never used  Substance and Sexual Activity   Alcohol use: No    Alcohol/week: 0.0 standard drinks   Drug use: No   Sexual activity: Not on file  Other Topics Concern   Not on file  Social History Narrative   Not on file   Social Determinants of Health   Financial Resource Strain: Not on file  Food Insecurity: Not on file  Transportation Needs: Not on file  Physical Activity: Not on file  Stress: Not on file  Social Connections: Not on file     Review of Systems  Constitutional:  Negative for appetite change and unexpected weight change.  HENT:  Negative for congestion, sinus pressure and sore throat.   Eyes:  Negative for pain and visual disturbance.  Respiratory:  Negative for cough, chest tightness and shortness of breath.   Cardiovascular:  Negative for chest pain, palpitations and leg swelling.  Gastrointestinal:  Negative for abdominal pain, diarrhea, nausea and vomiting.  Genitourinary:  Negative for difficulty urinating and dysuria.  Musculoskeletal:  Negative for myalgias.       Right knee pain as outlined.  Skin:  Negative for color change and rash.  Neurological:  Negative for dizziness, light-headedness and headaches.  Hematological:  Negative for adenopathy. Does not bruise/bleed easily.  Psychiatric/Behavioral:  Negative for decreased concentration and dysphoric mood.       Objective:     BP 130/72   Pulse 94   Temp (!) 96.7 F (35.9 C)   Resp 16   Ht _0  (1.626 m)   Wt 236 lb (107 kg)   SpO2 97%   BMI 40.51 kg/m  Wt Readings from Last 3 Encounters:  07/31/21 236 lb (107 kg)  03/28/21 230 lb (104.3 kg)  12/30/20 230 lb (104.3 kg)    Physical Exam Vitals reviewed.  Constitutional:      General: She is not in acute distress.    Appearance: Normal appearance. She is  well-developed.  HENT:     Head: Normocephalic and atraumatic.     Right Ear: External ear normal.     Left Ear: External ear normal.  Eyes:     General: No scleral icterus.       Right eye: No discharge.        Left eye: No discharge.     Conjunctiva/sclera: Conjunctivae normal.  Neck:     Thyroid: No thyromegaly.  Cardiovascular:     Rate and Rhythm: Normal rate and regular rhythm.  Pulmonary:     Effort: No tachypnea, accessory muscle usage or respiratory distress.     Breath sounds: Normal breath sounds. No decreased breath sounds or wheezing.  Chest:  Breasts:    Right: No inverted nipple, mass, nipple discharge or tenderness (no axillary adenopathy).     Left: No inverted nipple, mass, nipple discharge or tenderness (no axilarry adenopathy).  Abdominal:     General: Bowel sounds are normal.     Palpations: Abdomen is soft.     Tenderness: There is no abdominal tenderness.  Musculoskeletal:        General: No swelling.     Cervical back: Neck supple.     Comments: Increased pain - knee with straightening and flexion against resistance.    Lymphadenopathy:     Cervical: No cervical adenopathy.  Skin:    Findings: No erythema or rash.  Neurological:     Mental Status: She is alert and oriented to person, place, and time.  Psychiatric:        Mood and Affect: Mood normal.        Behavior: Behavior normal.     Outpatient Encounter Medications as of 07/31/2021  Medication Sig   busPIRone (BUSPAR) 5 MG tablet Take 1 tablet (5 mg total) by mouth 2 (two) times daily as needed.   cetirizine (ZYRTEC) 10 MG tablet Take 10 mg by mouth daily.   citalopram (CELEXA) 20 MG tablet TAKE 1 AND 1/2 TABLETS BY  MOUTH DAILY   Coenzyme Q10 (CO Q 10) 100 MG CAPS Take 50 mg by mouth daily.    ELDERBERRY PO Take 50 mg by mouth daily.   fish oil-omega-3 fatty acids 1000 MG capsule Take 2 g by mouth daily.   GARLIC PO Take by mouth.   levothyroxine (SYNTHROID) 75 MCG tablet TAKE 1  TABLET(75 MCG) BY MOUTH DAILY BEFORE AND BREAKFAST   lisinopril (ZESTRIL) 20 MG tablet TAKE 1 TABLET BY MOUTH ONCE DAILY   meloxicam (MOBIC) 15 MG tablet Take 15 mg by mouth daily.   Multiple Vitamin (MULTIVITAMIN WITH MINERALS) TABS Take 1 tablet by mouth daily.  Multiple Vitamin (MULTIVITAMIN) tablet Take 1 tablet by mouth daily.   rosuvastatin (CRESTOR) 20 MG tablet TAKE 1 TABLET BY MOUTH  DAILY   [DISCONTINUED] fluticasone (FLONASE) 50 MCG/ACT nasal spray USE 2 SPRAYS IN EACH  NOSTRIL DAILY   No facility-administered encounter medications on file as of 07/31/2021.     Lab Results  Component Value Date   WBC 9.4 05/09/2021   HGB 12.9 05/09/2021   HCT 38.3 05/09/2021   PLT 298 05/09/2021   GLUCOSE 102 (H) 03/27/2021   CHOL 189 03/27/2021   TRIG 162 (H) 03/27/2021   HDL 50 03/27/2021   LDLDIRECT 135.3 07/20/2014   LDLCALC 111 (H) 03/27/2021   ALT 22 05/09/2021   AST 18 05/09/2021   NA 141 03/27/2021   K 4.9 03/27/2021   CL 103 03/27/2021   CREATININE 0.90 03/27/2021   BUN 11 03/27/2021   CO2 23 03/27/2021   TSH 1.710 01/16/2021   HGBA1C 6.3 (H) 03/27/2021       Assessment & Plan:   Problem List Items Addressed This Visit     Anemia    Follow cbc.       Health care maintenance    Physical today 07/31/21.  PAP 06/18/19 - negative with negative HPV.  Colonoscopy 05/2018.  Recommended f/u in 10 years.       Hypercholesterolemia    Currently on pravastatin.  Low cholesterol diet and exercise.  Follow lipid panel and liver function tests.  Will change to crestor 27m q day.        Relevant Orders   TSH   Lipid panel   Hepatic function panel   Hyperglycemia    Low carb diet and exercise.  Follow met b and a1c       Relevant Orders   Hemoglobin A1c   Hypertension - Primary    Blood pressure as outlined.  Continue lisinopril.  Follow pressures.  Follow metabolic panel.       Relevant Orders   Basic metabolic panel   Numbness of right foot    Saw neurology.   Note reviewed. Increased fatigue with gabapentin.  Keep f/u with neurology.       Right knee pain    Persistent right knee pain limiting activity.  Worse when walking down and incline, etc.  Has seen ortho. Has taken meloxicam.  Had xrays.  Given persistent pain and limitations, check MRI.        Relevant Orders   MR Knee Right Wo Contrast   Stress    On citalopram.  Continue.  Has buspar 5383mbid prn.  Stable. Follow.        Other Visit Diagnoses     Need for immunization against influenza       Relevant Orders   Flu Vaccine QUAD 83m52mo (Fluarix, Fluzone & Alfiuria Quad PF) (Completed)        ChaEinar PheasantD

## 2021-08-01 ENCOUNTER — Other Ambulatory Visit: Payer: Self-pay | Admitting: Internal Medicine

## 2021-08-07 ENCOUNTER — Encounter: Payer: Self-pay | Admitting: Internal Medicine

## 2021-08-07 NOTE — Assessment & Plan Note (Signed)
Follow cbc.  

## 2021-08-07 NOTE — Assessment & Plan Note (Signed)
Blood pressure as outlined.  Continue lisinopril.  Follow pressures.  Follow metabolic panel.   

## 2021-08-07 NOTE — Assessment & Plan Note (Signed)
Saw neurology.  Note reviewed. Increased fatigue with gabapentin.  Keep f/u with neurology.

## 2021-08-07 NOTE — Assessment & Plan Note (Signed)
Persistent right knee pain limiting activity.  Worse when walking down and incline, etc.  Has seen ortho. Has taken meloxicam.  Had xrays.  Given persistent pain and limitations, check MRI.

## 2021-08-07 NOTE — Assessment & Plan Note (Signed)
Currently on pravastatin.  Low cholesterol diet and exercise.  Follow lipid panel and liver function tests.  Will change to crestor 20mg q day.   

## 2021-08-07 NOTE — Assessment & Plan Note (Signed)
Low carb diet and exercise.  Follow met b and a1c.   

## 2021-08-07 NOTE — Assessment & Plan Note (Signed)
On citalopram.  Continue.  Has buspar 5mg  bid prn.  Stable. Follow.

## 2021-08-14 ENCOUNTER — Other Ambulatory Visit: Payer: Self-pay

## 2021-08-14 ENCOUNTER — Other Ambulatory Visit (INDEPENDENT_AMBULATORY_CARE_PROVIDER_SITE_OTHER): Payer: Managed Care, Other (non HMO)

## 2021-08-14 DIAGNOSIS — R739 Hyperglycemia, unspecified: Secondary | ICD-10-CM

## 2021-08-14 DIAGNOSIS — E7800 Pure hypercholesterolemia, unspecified: Secondary | ICD-10-CM

## 2021-08-14 DIAGNOSIS — I10 Essential (primary) hypertension: Secondary | ICD-10-CM

## 2021-08-14 NOTE — Addendum Note (Signed)
Addended by: Clearnce Sorrel on: 08/14/2021 09:30 AM   Modules accepted: Orders

## 2021-08-14 NOTE — Addendum Note (Signed)
Addended by: Clearnce Sorrel on: 08/14/2021 09:37 AM   Modules accepted: Orders

## 2021-08-15 ENCOUNTER — Other Ambulatory Visit: Payer: Self-pay | Admitting: Internal Medicine

## 2021-08-15 DIAGNOSIS — E875 Hyperkalemia: Secondary | ICD-10-CM

## 2021-08-15 LAB — BASIC METABOLIC PANEL
BUN/Creatinine Ratio: 17 (ref 9–23)
BUN: 15 mg/dL (ref 6–24)
CO2: 25 mmol/L (ref 20–29)
Calcium: 9.7 mg/dL (ref 8.7–10.2)
Chloride: 102 mmol/L (ref 96–106)
Creatinine, Ser: 0.88 mg/dL (ref 0.57–1.00)
Glucose: 106 mg/dL — ABNORMAL HIGH (ref 70–99)
Potassium: 5 mmol/L (ref 3.5–5.2)
Sodium: 139 mmol/L (ref 134–144)
eGFR: 77 mL/min/{1.73_m2} (ref >59)

## 2021-08-15 LAB — LIPID PANEL
Chol/HDL Ratio: 3.2 ratio (ref 0.0–4.4)
Cholesterol, Total: 149 mg/dL (ref 100–199)
HDL: 46 mg/dL (ref >39)
LDL Chol Calc (NIH): 76 mg/dL (ref 0–99)
Triglycerides: 154 mg/dL — ABNORMAL HIGH (ref 0–149)
VLDL Cholesterol Cal: 27 mg/dL (ref 5–40)

## 2021-08-15 LAB — HEMOGLOBIN A1C
Est. average glucose Bld gHb Est-mCnc: 137 mg/dL
Hgb A1c MFr Bld: 6.4 % — ABNORMAL HIGH (ref 4.8–5.6)

## 2021-08-15 LAB — HEPATIC FUNCTION PANEL
ALT: 25 IU/L (ref 0–32)
AST: 21 IU/L (ref 0–40)
Albumin: 4.6 g/dL (ref 3.8–4.9)
Alkaline Phosphatase: 87 IU/L (ref 44–121)
Bilirubin Total: 0.5 mg/dL (ref 0.0–1.2)
Bilirubin, Direct: 0.15 mg/dL (ref 0.00–0.40)
Total Protein: 6.8 g/dL (ref 6.0–8.5)

## 2021-08-15 LAB — TSH: TSH: 2.82 u[IU]/mL (ref 0.450–4.500)

## 2021-08-15 NOTE — Progress Notes (Signed)
Order placed for f/u potassium check.  

## 2021-08-16 ENCOUNTER — Encounter: Payer: Self-pay | Admitting: Internal Medicine

## 2021-08-30 ENCOUNTER — Other Ambulatory Visit: Payer: Self-pay

## 2021-08-30 ENCOUNTER — Other Ambulatory Visit (INDEPENDENT_AMBULATORY_CARE_PROVIDER_SITE_OTHER): Payer: Managed Care, Other (non HMO)

## 2021-08-30 DIAGNOSIS — E875 Hyperkalemia: Secondary | ICD-10-CM

## 2021-08-30 LAB — POTASSIUM: Potassium: 4 mmol/L (ref 3.5–5.2)

## 2021-10-17 ENCOUNTER — Other Ambulatory Visit: Payer: Self-pay | Admitting: Internal Medicine

## 2021-12-01 ENCOUNTER — Ambulatory Visit: Payer: Managed Care, Other (non HMO) | Admitting: Internal Medicine

## 2021-12-01 ENCOUNTER — Other Ambulatory Visit: Payer: Self-pay

## 2021-12-01 VITALS — BP 128/72 | HR 100 | Temp 97.8°F | Resp 16 | Ht 64.0 in | Wt 238.6 lb

## 2021-12-01 DIAGNOSIS — R739 Hyperglycemia, unspecified: Secondary | ICD-10-CM

## 2021-12-01 DIAGNOSIS — R197 Diarrhea, unspecified: Secondary | ICD-10-CM

## 2021-12-01 DIAGNOSIS — E7800 Pure hypercholesterolemia, unspecified: Secondary | ICD-10-CM

## 2021-12-01 DIAGNOSIS — I10 Essential (primary) hypertension: Secondary | ICD-10-CM

## 2021-12-01 DIAGNOSIS — M25561 Pain in right knee: Secondary | ICD-10-CM

## 2021-12-01 DIAGNOSIS — Z1159 Encounter for screening for other viral diseases: Secondary | ICD-10-CM

## 2021-12-01 DIAGNOSIS — Z114 Encounter for screening for human immunodeficiency virus [HIV]: Secondary | ICD-10-CM

## 2021-12-01 DIAGNOSIS — F439 Reaction to severe stress, unspecified: Secondary | ICD-10-CM

## 2021-12-01 NOTE — Patient Instructions (Signed)
Benefiber - fiber supplments  Examples of probiotics:   Culturelle Florastor align

## 2021-12-01 NOTE — Progress Notes (Signed)
Subjective:    Patient ID: Kristen Aguilar, female    DOB: 1965-12-31, 56 y.o.   MRN: [id number]  This visit occurred during the SARS-CoV-2 public health emergency.  Safety protocols were in place, including screening questions prior to the visit, additional usage of staff PPE, and extensive cleaning of exam room while observing appropriate contact time as indicated for disinfecting solutions.   Patient here for a scheduled follow up.   Chief Complaint  Patient presents with   Knee Pain    right   Hyperlipidemia   Hypertension   .   HPI Persistent increased knee pain - right knee.  Worse after she has been standing for a long period of time.  Lying and sitting - do not bother her.  Does limit her activity.  Has tried meloxicam.  Does help, but persistent problem.  Request referral to ortho.  No chest pain or sob reported.  No abdominal pain reported.  Discussed loose stool - diarrhea.  Intermittent.  Discussed fiber.  Discussed probiotics.     Past Medical History:  Diagnosis Date   Allergy    Anemia    as a child   Anxiety    Depression    Gestational diabetes    Hypertension    Pre-diabetes    Past Surgical History:  Procedure Laterality Date   CESAREAN SECTION  2001 & 2004   COLONOSCOPY WITH PROPOFOL N/A 06/09/2018   Procedure: COLONOSCOPY WITH PROPOFOL;  Surgeon: Jonathon Bellows, MD;  Location: [facility] ENDOSCOPY;  Service: Gastroenterology;  Laterality: N/A;   Family History  Problem Relation Age of Onset   Hypertension Mother    Hypertension Father    Hypertension Maternal Grandmother    Diabetes Maternal Grandmother    Breast cancer Paternal Grandmother    Social History   Socioeconomic History   Marital status: Married    Spouse name: Not on file   Number of children: 2   Years of education: Not on file   Highest education level: Not on file  Occupational History   Not on file  Tobacco Use   Smoking status: Never   Smokeless tobacco: Never  Vaping Use    Vaping Use: Never used  Substance and Sexual Activity   Alcohol use: No    Alcohol/week: 0.0 standard drinks   Drug use: No   Sexual activity: Not on file  Other Topics Concern   Not on file  Social History Narrative   Not on file   Social Determinants of Health   Financial Resource Strain: Not on file  Food Insecurity: Not on file  Transportation Needs: Not on file  Physical Activity: Not on file  Stress: Not on file  Social Connections: Not on file     Review of Systems  Constitutional:  Negative for appetite change and unexpected weight change.  HENT:  Negative for congestion and sinus pressure.   Respiratory:  Negative for cough, chest tightness and shortness of breath.   Cardiovascular:  Negative for chest pain, palpitations and leg swelling.  Gastrointestinal:  Negative for abdominal pain, diarrhea, nausea and vomiting.  Genitourinary:  Negative for difficulty urinating and dysuria.  Musculoskeletal:  Negative for joint swelling and myalgias.       Right knee pain as outlined.   Skin:  Negative for color change and rash.  Neurological:  Negative for dizziness, light-headedness and headaches.  Psychiatric/Behavioral:  Negative for agitation and dysphoric mood.       Objective:  BP 128/72    Pulse 100    Temp 97.8 F (36.6 C)    Resp 16    Ht _0  (1.626 m)    Wt 238 lb 9.6 oz (108.2 kg)    SpO2 97%    BMI 40.96 kg/m  Wt Readings from Last 3 Encounters:  12/01/21 238 lb 9.6 oz (108.2 kg)  07/31/21 236 lb (107 kg)  03/28/21 230 lb (104.3 kg)    Physical Exam Vitals reviewed.  Constitutional:      General: She is not in acute distress.    Appearance: Normal appearance.  HENT:     Head: Normocephalic and atraumatic.     Right Ear: External ear normal.     Left Ear: External ear normal.  Eyes:     General: No scleral icterus.       Right eye: No discharge.        Left eye: No discharge.     Conjunctiva/sclera: Conjunctivae normal.  Neck:     Thyroid:  No thyromegaly.  Cardiovascular:     Rate and Rhythm: Normal rate and regular rhythm.  Pulmonary:     Effort: No respiratory distress.     Breath sounds: Normal breath sounds. No wheezing.  Abdominal:     General: Bowel sounds are normal.     Palpations: Abdomen is soft.     Tenderness: There is no abdominal tenderness.  Musculoskeletal:        General: No swelling or tenderness.     Cervical back: Neck supple. No tenderness.  Lymphadenopathy:     Cervical: No cervical adenopathy.  Skin:    Findings: No erythema or rash.  Neurological:     Mental Status: She is alert.  Psychiatric:        Mood and Affect: Mood normal.        Behavior: Behavior normal.     Outpatient Encounter Medications as of 12/01/2021  Medication Sig   busPIRone (BUSPAR) 5 MG tablet Take 1 tablet (5 mg total) by mouth 2 (two) times daily as needed.   cetirizine (ZYRTEC) 10 MG tablet Take 10 mg by mouth daily.   citalopram (CELEXA) 20 MG tablet TAKE 1 AND 1/2 TABLETS BY  MOUTH DAILY   Coenzyme Q10 (CO Q 10) 100 MG CAPS Take 50 mg by mouth daily.    ELDERBERRY PO Take 50 mg by mouth daily.   fish oil-omega-3 fatty acids 1000 MG capsule Take 2 g by mouth daily.   fluticasone (FLONASE) 50 MCG/ACT nasal spray USE 2 SPRAYS IN BOTH  NOSTRILS DAILY   GARLIC PO Take by mouth.   levothyroxine (SYNTHROID) 75 MCG tablet TAKE 1 TABLET BY MOUTH  DAILY BEFORE BREAKFAST   lisinopril (ZESTRIL) 20 MG tablet TAKE 1 TABLET BY MOUTH ONCE DAILY   meloxicam (MOBIC) 15 MG tablet Take 15 mg by mouth daily.   Multiple Vitamin (MULTIVITAMIN WITH MINERALS) TABS Take 1 tablet by mouth daily.   Multiple Vitamin (MULTIVITAMIN) tablet Take 1 tablet by mouth daily.   rosuvastatin (CRESTOR) 20 MG tablet TAKE 1 TABLET BY MOUTH  DAILY   No facility-administered encounter medications on file as of 12/01/2021.     Lab Results  Component Value Date   WBC 9.4 05/09/2021   HGB 12.9 05/09/2021   HCT 38.3 05/09/2021   PLT 298 05/09/2021    GLUCOSE 106 (H) 08/14/2021   CHOL 149 08/14/2021   TRIG 154 (H) 08/14/2021   HDL 46 08/14/2021   LDLDIRECT  135.3 07/20/2014   LDLCALC 76 08/14/2021   ALT 25 08/14/2021   AST 21 08/14/2021   NA 139 08/14/2021   K 4.0 08/30/2021   CL 102 08/14/2021   CREATININE 0.88 08/14/2021   BUN 15 08/14/2021   CO2 25 08/14/2021   TSH 2.820 08/14/2021   HGBA1C 6.4 (H) 08/14/2021       Assessment & Plan:   Problem List Items Addressed This Visit     Diarrhea    Intermittent flares.  Discussed trial of fiber and probiotics.  Follow.  Notify me if persistent.       Hypercholesterolemia - Primary    Currently on crestor 22m q day.  Low cholesterol diet and exercise.  Follow lipid panel and liver function tests.        Relevant Orders   Lipid panel   Hepatic function panel   Hyperglycemia    Low carb diet and exercise.  Follow met b and a1c       Relevant Orders   Hemoglobin A1c   Hypertension    Blood pressure as outlined.  Continue lisinopril.  Follow pressures.  Follow metabolic panel.       Relevant Orders   Basic metabolic panel   Right knee pain    Persistent right knee pain limiting activity.  Worse when walking, standing and going up and down stairs.  Has seen ortho. Has taken meloxicam.  Had xrays.  Given persistent pain and limitations wanted to pursue MRI.  Insurance denied.  Request referral back to ortho.        Relevant Orders   Ambulatory referral to Orthopedic Surgery   Stress    On citalopram.  Continue.  Has buspar 587mbid prn.  Doing better.  Feels things have leveled off some. Follow.        Other Visit Diagnoses     Encounter for hepatitis C screening test for low risk patient       Relevant Orders   Hepatitis C antibody   Screening for HIV without presence of risk factors       Relevant Orders   HIV Antibody (routine testing w rflx)        ChEinar PheasantMD

## 2021-12-02 ENCOUNTER — Encounter: Payer: Self-pay | Admitting: Internal Medicine

## 2021-12-02 DIAGNOSIS — R197 Diarrhea, unspecified: Secondary | ICD-10-CM | POA: Insufficient documentation

## 2021-12-02 NOTE — Assessment & Plan Note (Signed)
Currently on crestor 20mg q day.  Low cholesterol diet and exercise.  Follow lipid panel and liver function tests.   

## 2021-12-02 NOTE — Assessment & Plan Note (Signed)
Low carb diet and exercise.  Follow met b and a1c  °

## 2021-12-02 NOTE — Assessment & Plan Note (Signed)
Persistent right knee pain limiting activity.  Worse when walking, standing and going up and down stairs.  Has seen ortho. Has taken meloxicam.  Had xrays.  Given persistent pain and limitations wanted to pursue MRI.  Insurance denied.  Request referral back to ortho.

## 2021-12-02 NOTE — Assessment & Plan Note (Signed)
Blood pressure as outlined.  Continue lisinopril.  Follow pressures.  Follow metabolic panel.   

## 2021-12-02 NOTE — Assessment & Plan Note (Signed)
Intermittent flares.  Discussed trial of fiber and probiotics.  Follow.  Notify me if persistent.

## 2021-12-02 NOTE — Assessment & Plan Note (Signed)
On citalopram.  Continue.  Has buspar 5mg  bid prn.  Doing better.  Feels things have leveled off some. Follow.

## 2021-12-22 ENCOUNTER — Other Ambulatory Visit (INDEPENDENT_AMBULATORY_CARE_PROVIDER_SITE_OTHER): Payer: Managed Care, Other (non HMO)

## 2021-12-22 ENCOUNTER — Other Ambulatory Visit: Payer: Self-pay

## 2021-12-22 DIAGNOSIS — R739 Hyperglycemia, unspecified: Secondary | ICD-10-CM

## 2021-12-22 DIAGNOSIS — E7800 Pure hypercholesterolemia, unspecified: Secondary | ICD-10-CM

## 2021-12-22 DIAGNOSIS — Z114 Encounter for screening for human immunodeficiency virus [HIV]: Secondary | ICD-10-CM

## 2021-12-22 DIAGNOSIS — Z1159 Encounter for screening for other viral diseases: Secondary | ICD-10-CM

## 2021-12-22 DIAGNOSIS — I10 Essential (primary) hypertension: Secondary | ICD-10-CM

## 2021-12-22 NOTE — Addendum Note (Signed)
Addended by: Neta Ehlers on: 12/22/2021 09:21 AM   Modules accepted: Orders

## 2021-12-22 NOTE — Addendum Note (Signed)
Addended by: Airyana Sprunger S on: 12/22/2021 09:20 AM ° ° Modules accepted: Orders ° °

## 2021-12-22 NOTE — Addendum Note (Signed)
Addended by: Neta Ehlers on: 12/22/2021 09:20 AM   Modules accepted: Orders

## 2021-12-22 NOTE — Addendum Note (Signed)
Addended by: Sahmya Arai S on: 12/22/2021 09:20 AM ° ° Modules accepted: Orders ° °

## 2021-12-22 NOTE — Addendum Note (Signed)
Addended by: Deidre Carino S on: 12/22/2021 09:20 AM ° ° Modules accepted: Orders ° °

## 2021-12-26 LAB — HEPATITIS C ANTIBODY: Hep C Virus Ab: NONREACTIVE

## 2021-12-26 LAB — BASIC METABOLIC PANEL
BUN/Creatinine Ratio: 15 (ref 9–23)
BUN: 14 mg/dL (ref 6–24)
CO2: 25 mmol/L (ref 20–29)
Calcium: 9.6 mg/dL (ref 8.7–10.2)
Chloride: 102 mmol/L (ref 96–106)
Creatinine, Ser: 0.94 mg/dL (ref 0.57–1.00)
Glucose: 105 mg/dL — ABNORMAL HIGH (ref 70–99)
Potassium: 4.4 mmol/L (ref 3.5–5.2)
Sodium: 142 mmol/L (ref 134–144)
eGFR: 71 mL/min/{1.73_m2} (ref >59)

## 2021-12-26 LAB — HEMOGLOBIN A1C
Est. average glucose Bld gHb Est-mCnc: 134 mg/dL
Hgb A1c MFr Bld: 6.3 % — ABNORMAL HIGH (ref 4.8–5.6)

## 2021-12-26 LAB — HEPATIC FUNCTION PANEL
ALT: 22 IU/L (ref 0–32)
AST: 20 IU/L (ref 0–40)
Albumin: 4.4 g/dL (ref 3.8–4.9)
Alkaline Phosphatase: 87 IU/L (ref 44–121)
Bilirubin Total: 0.6 mg/dL (ref 0.0–1.2)
Bilirubin, Direct: 0.18 mg/dL (ref 0.00–0.40)
Total Protein: 7 g/dL (ref 6.0–8.5)

## 2021-12-26 LAB — LIPID PANEL
Chol/HDL Ratio: 2.7 ratio (ref 0.0–4.4)
Cholesterol, Total: 133 mg/dL (ref 100–199)
HDL: 49 mg/dL (ref >39)
LDL Chol Calc (NIH): 58 mg/dL (ref 0–99)
Triglycerides: 152 mg/dL — ABNORMAL HIGH (ref 0–149)
VLDL Cholesterol Cal: 26 mg/dL (ref 5–40)

## 2021-12-26 LAB — HIV ANTIBODY (ROUTINE TESTING W REFLEX): HIV Screen 4th Generation wRfx: NONREACTIVE

## 2022-02-02 ENCOUNTER — Encounter: Payer: Self-pay | Admitting: Internal Medicine

## 2022-02-02 ENCOUNTER — Ambulatory Visit: Payer: Managed Care, Other (non HMO) | Admitting: Internal Medicine

## 2022-02-02 DIAGNOSIS — I10 Essential (primary) hypertension: Secondary | ICD-10-CM

## 2022-02-02 DIAGNOSIS — D649 Anemia, unspecified: Secondary | ICD-10-CM | POA: Diagnosis not present

## 2022-02-02 DIAGNOSIS — F439 Reaction to severe stress, unspecified: Secondary | ICD-10-CM | POA: Diagnosis not present

## 2022-02-02 DIAGNOSIS — R197 Diarrhea, unspecified: Secondary | ICD-10-CM

## 2022-02-02 DIAGNOSIS — E7800 Pure hypercholesterolemia, unspecified: Secondary | ICD-10-CM

## 2022-02-02 DIAGNOSIS — R739 Hyperglycemia, unspecified: Secondary | ICD-10-CM

## 2022-02-02 DIAGNOSIS — M25561 Pain in right knee: Secondary | ICD-10-CM

## 2022-02-02 NOTE — Progress Notes (Signed)
Patient ID: Kristen Aguilar, female   DOB: 02/12/1966, 56 y.o.   MRN: [id number] ? ? ?Subjective:  ? ? Patient ID: Kristen Aguilar, female    DOB: 02-Oct-1966, 56 y.o.   MRN: [id number] ? ?This visit occurred during the SARS-CoV-2 public health emergency.  Safety protocols were in place, including screening questions prior to the visit, additional usage of staff PPE, and extensive cleaning of exam room while observing appropriate contact time as indicated for disinfecting solutions.  ? ?Patient here for a scheduled follow up.  ? ?Chief Complaint  ?Patient presents with  ? Follow-up  ?  8wk f/u - HLD, HTN  ? .  ? ?HPI ?Doing well.  Some increased stress with family issues, appts, etc.  Overall she feels she is doing relatively well.  No chest pain or sob reported.  No abdominal pain.  Bowels are better.  Loose stool - better.  If increased stress, may have a little loose stool, but overall improved and not a significant issue for her.  Knee is doing better after injection.  Seeing ortho.   ? ? ?Past Medical History:  ?Diagnosis Date  ? Allergy   ? Anemia   ? as a child  ? Anxiety   ? Depression   ? Gestational diabetes   ? Hypertension   ? Pre-diabetes   ? ?Past Surgical History:  ?Procedure Laterality Date  ? CESAREAN SECTION  2001 & 2004  ? COLONOSCOPY WITH PROPOFOL N/A 06/09/2018  ? Procedure: COLONOSCOPY WITH PROPOFOL;  Surgeon: Jonathon Bellows, MD;  Location: [facility] ENDOSCOPY;  Service: Gastroenterology;  Laterality: N/A;  ? ?Family History  ?Problem Relation Age of Onset  ? Hypertension Mother   ? Hypertension Father   ? Hypertension Maternal Grandmother   ? Diabetes Maternal Grandmother   ? Breast cancer Paternal Grandmother   ? ?Social History  ? ?Socioeconomic History  ? Marital status: Married  ?  Spouse name: Not on file  ? Number of children: 2  ? Years of education: Not on file  ? Highest education level: Not on file  ?Occupational History  ? Not on file  ?Tobacco Use  ? Smoking status: Never  ? Smokeless  tobacco: Never  ?Vaping Use  ? Vaping Use: Never used  ?Substance and Sexual Activity  ? Alcohol use: No  ?  Alcohol/week: 0.0 standard drinks  ? Drug use: No  ? Sexual activity: Not on file  ?Other Topics Concern  ? Not on file  ?Social History Narrative  ? Not on file  ? ?Social Determinants of Health  ? ?Financial Resource Strain: Not on file  ?Food Insecurity: Not on file  ?Transportation Needs: Not on file  ?Physical Activity: Not on file  ?Stress: Not on file  ?Social Connections: Not on file  ? ? ? ?Review of Systems  ?Constitutional:  Negative for appetite change and unexpected weight change.  ?HENT:  Negative for congestion and sinus pressure.   ?Respiratory:  Negative for cough, chest tightness and shortness of breath.   ?Cardiovascular:  Negative for chest pain, palpitations and leg swelling.  ?Gastrointestinal:  Negative for abdominal pain, diarrhea, nausea and vomiting.  ?Genitourinary:  Negative for difficulty urinating and dyspareunia.  ?Musculoskeletal:  Negative for joint swelling and myalgias.  ?Skin:  Negative for color change and rash.  ?Neurological:  Negative for dizziness, light-headedness and headaches.  ?Psychiatric/Behavioral:  Negative for agitation and dysphoric mood.   ? ?   ?Objective:  ?  ? ?BP 120/78  Pulse 88   Temp 98.2 ?F (36.8 ?C) (Temporal)   Resp 14   Ht '5\' 4"'  (1.626 m)   Wt 238 lb (108 kg)   SpO2 98%   BMI 40.85 kg/m?  ?Wt Readings from Last 3 Encounters:  ?02/02/22 238 lb (108 kg)  ?12/01/21 238 lb 9.6 oz (108.2 kg)  ?07/31/21 236 lb (107 kg)  ? ? ?Physical Exam ?Vitals reviewed.  ?Constitutional:   ?   General: She is not in acute distress. ?   Appearance: Normal appearance.  ?HENT:  ?   Head: Normocephalic and atraumatic.  ?   Right Ear: External ear normal.  ?   Left Ear: External ear normal.  ?Eyes:  ?   General: No scleral icterus.    ?   Right eye: No discharge.     ?   Left eye: No discharge.  ?   Conjunctiva/sclera: Conjunctivae normal.  ?Neck:  ?   Thyroid: No  thyromegaly.  ?Cardiovascular:  ?   Rate and Rhythm: Normal rate and regular rhythm.  ?   Comments: Question of 1/6 systolic murmur.  ?Pulmonary:  ?   Effort: No respiratory distress.  ?   Breath sounds: Normal breath sounds. No wheezing.  ?Abdominal:  ?   General: Bowel sounds are normal.  ?   Palpations: Abdomen is soft.  ?   Tenderness: There is no abdominal tenderness.  ?Musculoskeletal:     ?   General: No swelling or tenderness.  ?   Cervical back: Neck supple. No tenderness.  ?Lymphadenopathy:  ?   Cervical: No cervical adenopathy.  ?Skin: ?   Findings: No erythema or rash.  ?Neurological:  ?   Mental Status: She is alert.  ?Psychiatric:     ?   Mood and Affect: Mood normal.     ?   Behavior: Behavior normal.  ? ? ? ?Outpatient Encounter Medications as of 02/02/2022  ?Medication Sig  ? busPIRone (BUSPAR) 5 MG tablet Take 1 tablet (5 mg total) by mouth 2 (two) times daily as needed.  ? cetirizine (ZYRTEC) 10 MG tablet Take 10 mg by mouth daily.  ? citalopram (CELEXA) 20 MG tablet TAKE 1 AND 1/2 TABLETS BY  MOUTH DAILY  ? Coenzyme Q10 (CO Q 10) 100 MG CAPS Take 50 mg by mouth daily.   ? ELDERBERRY PO Take 50 mg by mouth daily.  ? fish oil-omega-3 fatty acids 1000 MG capsule Take 2 g by mouth daily.  ? fluticasone (FLONASE) 50 MCG/ACT nasal spray USE 2 SPRAYS IN BOTH  NOSTRILS DAILY  ? GARLIC PO Take by mouth.  ? levothyroxine (SYNTHROID) 75 MCG tablet TAKE 1 TABLET BY MOUTH  DAILY BEFORE BREAKFAST  ? lisinopril (ZESTRIL) 20 MG tablet TAKE 1 TABLET BY MOUTH ONCE DAILY  ? Multiple Vitamin (MULTIVITAMIN WITH MINERALS) TABS Take 1 tablet by mouth daily.  ? Multiple Vitamin (MULTIVITAMIN) tablet Take 1 tablet by mouth daily.  ? rosuvastatin (CRESTOR) 20 MG tablet TAKE 1 TABLET BY MOUTH  DAILY  ? [DISCONTINUED] meloxicam (MOBIC) 15 MG tablet Take 15 mg by mouth daily. (Patient not taking: Reported on 02/02/2022)  ? ?No facility-administered encounter medications on file as of 02/02/2022.  ?  ? ?Lab Results  ?Component  Value Date  ? WBC 9.4 05/09/2021  ? HGB 12.9 05/09/2021  ? HCT 38.3 05/09/2021  ? PLT 298 05/09/2021  ? GLUCOSE 105 (H) 12/22/2021  ? CHOL 133 12/22/2021  ? TRIG 152 (H) 12/22/2021  ? HDL  49 12/22/2021  ? LDLDIRECT 135.3 07/20/2014  ? [employer] [place] 58 12/22/2021  ? ALT 22 12/22/2021  ? AST 20 12/22/2021  ? NA 142 12/22/2021  ? K 4.4 12/22/2021  ? CL 102 12/22/2021  ? CREATININE 0.94 12/22/2021  ? BUN 14 12/22/2021  ? CO2 25 12/22/2021  ? TSH 2.820 08/14/2021  ? HGBA1C 6.3 (H) 12/22/2021  ? ? ?   ?Assessment & Plan:  ? ?Problem List Items Addressed This Visit   ? ? Anemia  ?  Follow cbc.  ? ?  ?  ? Diarrhea  ?  Bowels better.  Follow.  ? ?  ?  ? Hypercholesterolemia  ?  Currently on crestor 65m q day.  Low cholesterol diet and exercise.  Follow lipid panel and liver function tests.   ? ?  ?  ? Relevant Orders  ? Hepatic function panel  ? Lipid panel  ? Hyperglycemia  ?  Low carb diet and exercise.  Follow met b and a1c  ? ?  ?  ? Relevant Orders  ? Hemoglobin A1c  ? Hypertension  ?  Blood pressure as outlined. Recheck improved.  Continue lisinopril.  Follow pressures.  Follow metabolic panel.  ? ?  ?  ? Relevant Orders  ? Basic metabolic panel  ? Right knee pain  ?  S/p injection.  Doing better.  Follow.   ? ?  ?  ? Stress  ?  On citalopram.  Continue.  Has buspar 53mbid prn.  Overall feels she is handling things relatively well.  Follow.   ? ?  ?  ? ? ? ?ChEinar PheasantMD  ?

## 2022-02-11 ENCOUNTER — Encounter: Payer: Self-pay | Admitting: Internal Medicine

## 2022-02-11 NOTE — Assessment & Plan Note (Signed)
Low carb diet and exercise.  Follow met b and a1c.   

## 2022-02-11 NOTE — Assessment & Plan Note (Signed)
S/p injection.  Doing better.  Follow.   

## 2022-02-11 NOTE — Assessment & Plan Note (Signed)
Bowels better.  Follow.   

## 2022-02-11 NOTE — Assessment & Plan Note (Signed)
On citalopram.  Continue.  Has buspar 5mg bid prn.  Overall feels she is handling things relatively well.  Follow.   

## 2022-02-11 NOTE — Assessment & Plan Note (Signed)
Follow cbc.  

## 2022-02-11 NOTE — Assessment & Plan Note (Signed)
Currently on crestor 20mg q day.  Low cholesterol diet and exercise.  Follow lipid panel and liver function tests.   

## 2022-02-11 NOTE — Assessment & Plan Note (Signed)
Blood pressure as outlined.  Recheck improved.  Continue lisinopril.  Follow pressures.  Follow metabolic panel.  

## 2022-05-22 ENCOUNTER — Other Ambulatory Visit: Payer: Self-pay | Admitting: Internal Medicine

## 2022-05-23 ENCOUNTER — Other Ambulatory Visit: Payer: Self-pay

## 2022-05-23 MED ORDER — ROSUVASTATIN CALCIUM 20 MG PO TABS: 20.0000 mg | ORAL_TABLET | Freq: Every day | ORAL | 3 refills | Status: DC

## 2022-05-23 MED ORDER — LISINOPRIL 20 MG PO TABS: 20.0000 mg | ORAL_TABLET | Freq: Every day | ORAL | 3 refills | Status: DC

## 2022-06-04 ENCOUNTER — Ambulatory Visit: Payer: Managed Care, Other (non HMO) | Admitting: Internal Medicine

## 2022-06-05 ENCOUNTER — Encounter: Payer: Self-pay | Admitting: Internal Medicine

## 2022-06-05 ENCOUNTER — Ambulatory Visit: Payer: Managed Care, Other (non HMO) | Admitting: Internal Medicine

## 2022-06-05 DIAGNOSIS — F439 Reaction to severe stress, unspecified: Secondary | ICD-10-CM

## 2022-06-05 DIAGNOSIS — I10 Essential (primary) hypertension: Secondary | ICD-10-CM

## 2022-06-05 DIAGNOSIS — M25561 Pain in right knee: Secondary | ICD-10-CM | POA: Diagnosis not present

## 2022-06-05 DIAGNOSIS — E7800 Pure hypercholesterolemia, unspecified: Secondary | ICD-10-CM

## 2022-06-05 DIAGNOSIS — R739 Hyperglycemia, unspecified: Secondary | ICD-10-CM

## 2022-06-05 LAB — HM DIABETES FOOT EXAM

## 2022-06-05 NOTE — Assessment & Plan Note (Addendum)
Blood pressure as outlined.  Recheck improved.  Continue lisinopril.  Follow pressures.  Follow metabolic panel.

## 2022-06-05 NOTE — Progress Notes (Signed)
Patient ID: Kristen Aguilar, female   DOB: [birth year], 56 y.o.   MRN: [id number]   Subjective:    Patient ID: Kristen Aguilar, female    DOB: Jun 22, 1966, 56 y.o.   MRN: [id number]   Patient here for a scheduled follow up.   Chief Complaint  Patient presents with   Hypertension   Hyperlipidemia   .   HPI Here to follow up regarding her blood pressure, blood sugar and cholesterol.  Also recently saw ortho 04/19/22 - f/u right knee pain.  Recommended surgical intervention.  Due to see Dr Marry Guan to discuss. Appt scheduled end of this week to discuss. Tries to stay active.  Is limited by her knee.  No chest pain or sob reported.  No abdominal pain.  Bowels moving.     Past Medical History:  Diagnosis Date   Allergy    Anemia    as a child   Anxiety    Depression    Gestational diabetes    Hypertension    Pre-diabetes    Past Surgical History:  Procedure Laterality Date   CESAREAN SECTION  2001 & 2004   COLONOSCOPY WITH PROPOFOL N/A 06/09/2018   Procedure: COLONOSCOPY WITH PROPOFOL;  Surgeon: Jonathon Bellows, MD;  Location: [facility] ENDOSCOPY;  Service: Gastroenterology;  Laterality: N/A;   Family History  Problem Relation Age of Onset   Hypertension Mother    Hypertension Father    Hypertension Maternal Grandmother    Diabetes Maternal Grandmother    Breast cancer Paternal Grandmother    Social History   Socioeconomic History   Marital status: Married    Spouse name: Not on file   Number of children: 2   Years of education: Not on file   Highest education level: Not on file  Occupational History   Not on file  Tobacco Use   Smoking status: Never   Smokeless tobacco: Never  Vaping Use   Vaping Use: Never used  Substance and Sexual Activity   Alcohol use: No    Alcohol/week: 0.0 standard drinks of alcohol   Drug use: No   Sexual activity: Not on file  Other Topics Concern   Not on file  Social History Narrative   Not on file   Social Determinants of Health    Financial Resource Strain: Not on file  Food Insecurity: Not on file  Transportation Needs: Not on file  Physical Activity: Not on file  Stress: Not on file  Social Connections: Not on file     Review of Systems  Constitutional:  Negative for appetite change and unexpected weight change.  HENT:  Negative for congestion and sinus pressure.   Respiratory:  Negative for cough, chest tightness and shortness of breath.   Cardiovascular:  Negative for chest pain, palpitations and leg swelling.  Gastrointestinal:  Negative for abdominal pain, diarrhea, nausea and vomiting.  Genitourinary:  Negative for difficulty urinating and dysuria.  Musculoskeletal:  Negative for myalgias.       Persistent right knee pain.  Skin:  Negative for color change and rash.  Neurological:  Negative for dizziness, light-headedness and headaches.  Psychiatric/Behavioral:  Negative for agitation and dysphoric mood.        Objective:     BP (!) 142/80 (BP Location: Left Arm, Patient Position: Sitting, Cuff Size: Large)   Pulse 86   Temp 97.9 F (36.6 C) (Temporal)   Resp 16   Ht 5' 4" (1.626 m)   Wt 239 lb 12.8 oz (108.8  kg)   SpO2 98%   BMI 41.16 kg/m  Wt Readings from Last 3 Encounters:  06/05/22 239 lb 12.8 oz (108.8 kg)  02/02/22 238 lb (108 kg)  12/01/21 238 lb 9.6 oz (108.2 kg)    Physical Exam Vitals reviewed.  Constitutional:      General: She is not in acute distress.    Appearance: Normal appearance.  HENT:     Head: Normocephalic and atraumatic.     Right Ear: External ear normal.     Left Ear: External ear normal.  Eyes:     General: No scleral icterus.       Right eye: No discharge.        Left eye: No discharge.     Conjunctiva/sclera: Conjunctivae normal.  Neck:     Thyroid: No thyromegaly.  Cardiovascular:     Rate and Rhythm: Normal rate and regular rhythm.  Pulmonary:     Effort: No respiratory distress.     Breath sounds: Normal breath sounds. No wheezing.   Abdominal:     General: Bowel sounds are normal.     Palpations: Abdomen is soft.     Tenderness: There is no abdominal tenderness.  Musculoskeletal:        General: No swelling or tenderness.     Cervical back: Neck supple. No tenderness.  Lymphadenopathy:     Cervical: No cervical adenopathy.  Skin:    Findings: No erythema or rash.  Neurological:     Mental Status: She is alert.  Psychiatric:        Mood and Affect: Mood normal.        Behavior: Behavior normal.      Outpatient Encounter Medications as of 06/05/2022  Medication Sig   busPIRone (BUSPAR) 5 MG tablet Take 1 tablet (5 mg total) by mouth 2 (two) times daily as needed.   cetirizine (ZYRTEC) 10 MG tablet Take 10 mg by mouth daily.   citalopram (CELEXA) 20 MG tablet TAKE 1 AND 1/2 TABLETS BY  MOUTH DAILY   Coenzyme Q10 (CO Q 10) 100 MG CAPS Take 50 mg by mouth daily.    ELDERBERRY PO Take 50 mg by mouth daily.   fish oil-omega-3 fatty acids 1000 MG capsule Take 2 g by mouth daily.   fluticasone (FLONASE) 50 MCG/ACT nasal spray USE 2 SPRAYS IN BOTH  NOSTRILS DAILY   levothyroxine (SYNTHROID) 75 MCG tablet TAKE 1 TABLET BY MOUTH  DAILY BEFORE BREAKFAST   lisinopril (ZESTRIL) 20 MG tablet Take 1 tablet (20 mg total) by mouth daily.   Multiple Vitamin (MULTIVITAMIN) tablet Take 1 tablet by mouth daily.   rosuvastatin (CRESTOR) 20 MG tablet Take 1 tablet (20 mg total) by mouth daily.   [DISCONTINUED] GARLIC PO Take by mouth.   [DISCONTINUED] Multiple Vitamin (MULTIVITAMIN WITH MINERALS) TABS Take 1 tablet by mouth daily.   No facility-administered encounter medications on file as of 06/05/2022.     Lab Results  Component Value Date   WBC 9.4 05/09/2021   HGB 12.9 05/09/2021   HCT 38.3 05/09/2021   PLT 298 05/09/2021   GLUCOSE 105 (H) 12/22/2021   CHOL 133 12/22/2021   TRIG 152 (H) 12/22/2021   HDL 49 12/22/2021   LDLDIRECT 135.3 07/20/2014   LDLCALC 58 12/22/2021   ALT 22 12/22/2021   AST 20 12/22/2021   NA  142 12/22/2021   K 4.4 12/22/2021   CL 102 12/22/2021   CREATININE 0.94 12/22/2021   BUN 14 12/22/2021  CO2 25 12/22/2021   TSH 2.820 08/14/2021   HGBA1C 6.3 (H) 12/22/2021       Assessment & Plan:   Problem List Items Addressed This Visit     Hypercholesterolemia    Currently on crestor 89m q day.  Low cholesterol diet and exercise.  Follow lipid panel and liver function tests.        Relevant Orders   CBC with Differential/Platelet   Hyperglycemia    Low carb diet and exercise.  Follow met b and a1c.  Has cut out snacks.       Hypertension    Blood pressure as outlined.  Recheck improved.  Continue lisinopril.  Follow pressures.  Follow metabolic panel.       Right knee pain    Seeing ortho. Persistent pain.  Planning to meet with Dr HMarry Guanto discuss treatment options.       Stress    On citalopram.  Continue.  Has buspar 559mbid prn.  Overall feels she is handling things relatively well.  Follow.          ChEinar PheasantMD

## 2022-06-05 NOTE — Assessment & Plan Note (Signed)
On citalopram.  Continue.  Has buspar 5mg bid prn.  Overall feels she is handling things relatively well.  Follow.   

## 2022-06-17 ENCOUNTER — Encounter: Payer: Self-pay | Admitting: Internal Medicine

## 2022-06-17 DIAGNOSIS — M1711 Unilateral primary osteoarthritis, right knee: Secondary | ICD-10-CM | POA: Insufficient documentation

## 2022-06-17 NOTE — Assessment & Plan Note (Addendum)
Low carb diet and exercise.  Follow met b and a1c.  Has cut out snacks.

## 2022-06-17 NOTE — Assessment & Plan Note (Signed)
Seeing ortho. Persistent pain.  Planning to meet with Dr Ernest Pine to discuss treatment options.

## 2022-06-17 NOTE — Assessment & Plan Note (Signed)
Currently on crestor 20mg  q day.  Low cholesterol diet and exercise.  Follow lipid panel and liver function tests.

## 2022-06-25 NOTE — Discharge Instructions (Signed)
Instructions after Total Knee Replacement   Shenicka Sunderlin P. Sharica Roedel, Jr., M.D.     Dept. of Orthopaedics & Sports Medicine  [facility]  [address]  [place], [place]  [address]  Phone: [phone]   Fax: [phone]    DIET: Drink plenty of non-alcoholic fluids. Resume your normal diet. Include foods high in fiber.  ACTIVITY:  You may use crutches or a walker with weight-bearing as tolerated, unless instructed otherwise. You may be weaned off of the walker or crutches by your Physical Therapist.  Do NOT place pillows under the knee. Anything placed under the knee could limit your ability to straighten the knee.   Continue doing gentle exercises. Exercising will reduce the pain and swelling, increase motion, and prevent muscle weakness.   Please continue to use the TED compression stockings for 6 weeks. You may remove the stockings at night, but should reapply them in the morning. Do not drive or operate any equipment until instructed.  WOUND CARE:  Continue to use the PolarCare or ice packs periodically to reduce pain and swelling. You may bathe or shower after the staples are removed at the first office visit following surgery.  MEDICATIONS: You may resume your regular medications. Please take the pain medication as prescribed on the medication. Do not take pain medication on an empty stomach. You have been given a prescription for a blood thinner (Lovenox or Coumadin). Please take the medication as instructed. (NOTE: After completing a 2 week course of Lovenox, take one Enteric-coated aspirin once a day. This along with elevation will help reduce the possibility of phlebitis in your operated leg.) Do not drive or drink alcoholic beverages when taking pain medications.  CALL THE OFFICE FOR: Temperature above 101 degrees Excessive bleeding or drainage on the dressing. Excessive swelling, coldness, or paleness of the toes. Persistent nausea and vomiting.  FOLLOW-UP:  You  should have an appointment to return to the office in 10-14 days after surgery. Arrangements have been made for continuation of Physical Therapy (either home therapy or outpatient therapy).   [facility] Department Directory         [email or web address]       [email or web address]          Cardiology  Appointments: [place] - [phone] [place] - [phone]  Endocrinology  Appointments: [place] - [phone] [place] - [phone]  Gastroenterology  Appointments: [place] - [phone] [place] - [phone]        General Surgery   Appointments: [place] - [phone]  Internal Medicine/Family Medicine  Appointments: [place] - [phone] [place] - [phone] [place] - [phone]  Metabolic and Weigh Loss Surgery  Appointments: [place] - [phone]        Neurology  Appointments: [place] - [phone] [place] - [phone]  Neurosurgery  Appointments: [place] - [phone]  Obstetrics & Gynecology  Appointments: [place] - [phone] [place] - [phone]        Pediatrics  Appointments: [place] - [phone] [place] - [phone]  Physiatry  Appointments: [place] -[phone]  Physical Therapy  Appointments: [place] - [phone] [place] - [phone]        Podiatry  Appointments: [place] - [phone] [place] - [phone]  Pulmonology  Appointments: [place] - [phone]  Rheumatology  Appointments: [place] - [phone]        [place] Location: [facility]  [address] [place], [place]  [address]  [place] Location: [facility] [address] [place], [place]  [address]  [place] Location: [facility] [address] [place], [place]  [address]    

## 2022-06-25 NOTE — H&P (Signed)
ORTHOPAEDIC HISTORY & PHYSICAL Kristen Aguilar, Adelina Mings., MD - 06/14/2022 3:30 PM EDT Formatting of this note is different from the original. Images from the original note were not included. Chief Complaint: Chief Complaint  Patient presents with  Right knee degenerative arthrosis   Reason for Visit: The patient is a 56 y.o. female who presents today for reevaluation of her right knee. She reports a long history of right knee pain that has increased in severity over the last several months. She localizes most of the pain along the medial aspect of the knee. She reports some swelling, some locking, and some giving way of the knee. The pain is aggravated by any weight bearing. The knee pain limits the patient's ability to ambulate long distances. The patient has not appreciated any significant improvement despite NSAIDs, viscosupplementation, activity modification, and ambulatory aids. She is using a cane for ambulation. The patient states that the knee pain has progressed to the point that it is significantly interfering with her activities of daily living.  Medications: Current Outpatient Medications  Medication Sig Dispense Refill  cetirizine (ZYRTEC) 10 MG tablet Take 10 mg by mouth once daily  cholecalciferol (VITAMIN D3) 1000 unit tablet Take 1,000 Units by mouth at bedtime  citalopram (CELEXA) 20 MG tablet Take 20 mg by mouth once daily  co-enzyme Q-10, ubiquinone, 100 mg capsule Take 100 mg by mouth once daily  fluticasone propionate (FLONASE) 50 mcg/actuation nasal spray Place 2 sprays into both nostrils once daily  levothyroxine (SYNTHROID) 75 MCG tablet Take 75 mcg by mouth once daily Take on an empty stomach with a glass of water at least 30-60 minutes before breakfast.  lisinopriL (ZESTRIL) 20 MG tablet Take 20 mg by mouth once daily  multivitamin tablet Take 1 tablet by mouth once daily  omega 3-dha-epa-fish oil 1,000 mg (120 mg-180 mg) Cap Take 1 capsule by mouth at bedtime   rosuvastatin (CRESTOR) 20 MG tablet Take 20 mg by mouth once daily   No current facility-administered medications for this visit.   Allergies: Allergies  Allergen Reactions  Penicillins Rash   Past Medical History: Past Medical History:  Diagnosis Date  Anemia  Anxiety  Depression  Diabetes mellitus without complication (CMS-HCC)  History of chicken pox   Past Surgical History: Past Surgical History:  Procedure Laterality Date  CESAREAN SECTION  COLONOSCOPY   Social History: Social History   Socioeconomic History  Marital status: Married  Spouse name: Dorinda Hill  Number of children: 2  Years of education: 14  Highest education level: Associate degree: occupational, Scientist, product/process development, or vocational program  Occupational History  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  Tobacco Use  Smoking status: Never  Smokeless tobacco: Never  Advertising account planner Use: Never used  Substance and Sexual Activity  Alcohol use: Never  Drug use: Never  Sexual activity: Yes  Partners: Male   Family History: Family History  Problem Relation Age of Onset  Hyperlipidemia (Elevated cholesterol) Mother  High blood pressure (Hypertension) Mother  Atrial fibrillation (Abnormal heart rhythm sometimes requiring treatment with blood thinners) Mother  Diabetes type II Father  Atrial fibrillation (Abnormal heart rhythm sometimes requiring treatment with blood thinners) Father  Hyperlipidemia (Elevated cholesterol) Father  High blood pressure (Hypertension) Father  Hyperlipidemia (Elevated cholesterol) Sister  Fibromyalgia Sister   Review of Systems: A comprehensive 14 point ROS was performed, reviewed, and the pertinent orthopaedic findings are documented in the HPI.  Exam BP (!) 148/82  Ht 162.6 cm (5\' 4" )  Wt (!) 108 kg (238 lb)  BMI 40.85 kg/m   General:  Well-developed, well-nourished female seen in no acute distress.  Antalgic gait. Varus thrust to the right  knee.  HEENT:  Atraumatic, normocephalic. Pupils are equal and reactive to light. Extraocular motion is intact. Sclera are clear. Oropharynx is clear with moist mucosa.  Neck:  Supple, nontender, and with good ROM. No thyromegaly, adenopathy, JVD, or carotid bruits.  Lungs:  Clear to auscultation bilaterally.  Cardiovascular:  Regular rate and rhythm. Normal S1, S2. No murmur . No appreciable gallops or rubs. Peripheral pulses are palpable. No lower extremity edema. Homan`s test is negative.  Abdomen:  Soft, nontender, nondistended. Bowel sounds are present.  Extremities: Good strength, stability, and range of motion of the upper extremities. Good range of motion of the hips and ankles.  Right Knee: Soft tissue swelling: minimal Effusion: none Erythema: none Crepitance: mild Tenderness: medial Alignment: relative varus Mediolateral laxity: medial pseudolaxity Posterior sag: negative Patellar tracking: Good tracking without evidence of subluxation or tilt Atrophy: No significant atrophy.  Quadriceps tone was fair to good. Range of motion: 0/0/119 degrees  Neurologic:  Awake, alert, and oriented.  Sensory function is intact to pinprick and light touch.  Motor strength is judged to be 5/5.  Motor coordination is within normal limits.  No apparent clonus. No tremor.   X-rays: I reviewed the right knee radiographs that were performed at [facility] on 04/19/2022. There is significant narrowing of the medial cartilage space with bone-on-bone articulation and associated varus alignment. Osteophyte formation is noted. Subchondral sclerosis is noted. No evidence of fracture or dislocation.   Impression: Degenerative arthrosis of the right knee  Plan:  The findings were discussed in detail with the patient. The patient was given informational material on total knee replacement. Conservative treatment options were reviewed with the patient. We discussed the risks and  benefits of surgical intervention. The usual perioperative course was also discussed in detail. The patient expressed understanding of the risks and benefits of surgical intervention and would like to proceed with plans for right total knee arthroplasty.  Hemoglobin A1c is an indication of glucose control. Uncontrolled diabetes has been associated with perioperative complications including poor wound healing and surgical site infections. To decrease the risk of perioperative complications, hemoglobin A1c must be less than 8.0 prior to surgery. The patient is encouraged to work with her primary care physician and/or endocrinologist to optimize glucose management.  Hemoglobin A1c was 6.3 on 12/22/2021.  Body mass index is 40.85 kg/m. Obesity has been associated with hypertension, diabetes, dyslipidemia, obstructive sleep apnea, arthritis, back pain, and depression. Obesity is most commonly caused by sedentary lifestyle and increased calorie intake. The patient should increase physical activity (especially low impact exercise) and reduce dietary intake (e.g. small portion via small plate size, avoid starving yourself, avoid highly processed food). Body mass index greater than 40 has been associated with increased risk for perioperative complications including poor wound healing, surgical site infection, deep venous thrombosis, and cardiopulmonary complications. Wt Readings from Last 3 Encounters:  06/14/22 (!) 108 kg (238 lb)  04/19/22 (!) 107.3 kg (236 lb 9.6 oz)  01/04/22 (!) 107.2 kg (236 lb 6.4 oz)   I spent a total of 45 minutes in both face-to-face and non-face-to-face activities, excluding procedures performed, for this visit on the date of this encounter.  MEDICAL CLEARANCE: Per anesthesiology. ACTIVITY: As tolerated. WORK STATUS: Not applicable. THERAPY: Preoperative physical therapy evaluation. MEDICATIONS: Requested Prescriptions   No prescriptions requested  or ordered in this  encounter   FOLLOW-UP: Return for preop History & Physical pending surgery date.  Amariya Liskey P. Holley Bouche., M.D.  This note was generated in part with voice recognition software and I apologize for any typographical errors that were not detected and corrected.   Electronically signed by Lamar Benes., MD at 06/17/2022 7:18 PM EDT

## 2022-06-28 ENCOUNTER — Encounter
Admission: RE | Admit: 2022-06-28 | Discharge: 2022-06-28 | Disposition: A | Discharge status: Home / Self Care | Payer: Managed Care, Other (non HMO) | Source: Ambulatory Visit | Attending: Orthopedic Surgery | Admitting: Orthopedic Surgery

## 2022-06-28 ENCOUNTER — Other Ambulatory Visit: Payer: Self-pay

## 2022-06-28 VITALS — HR 74 | Resp 18 | Ht 64.0 in | Wt 239.6 lb

## 2022-06-28 DIAGNOSIS — R739 Hyperglycemia, unspecified: Secondary | ICD-10-CM

## 2022-06-28 DIAGNOSIS — M1711 Unilateral primary osteoarthritis, right knee: Secondary | ICD-10-CM | POA: Diagnosis not present

## 2022-06-28 DIAGNOSIS — Z880 Allergy status to penicillin: Secondary | ICD-10-CM

## 2022-06-28 DIAGNOSIS — R8290 Unspecified abnormal findings in urine: Secondary | ICD-10-CM

## 2022-06-28 DIAGNOSIS — E7800 Pure hypercholesterolemia, unspecified: Secondary | ICD-10-CM

## 2022-06-28 DIAGNOSIS — Z01812 Encounter for preprocedural laboratory examination: Secondary | ICD-10-CM

## 2022-06-28 DIAGNOSIS — D649 Anemia, unspecified: Secondary | ICD-10-CM

## 2022-06-28 DIAGNOSIS — Z01818 Encounter for other preprocedural examination: Secondary | ICD-10-CM | POA: Diagnosis present

## 2022-06-28 HISTORY — DX: Other specified postprocedural states: Z98.890

## 2022-06-28 HISTORY — DX: Hypothyroidism, unspecified: E03.9

## 2022-06-28 HISTORY — DX: Pneumonia, unspecified organism: J18.9

## 2022-06-28 HISTORY — DX: Other specified postprocedural states: R11.2

## 2022-06-28 HISTORY — DX: Unspecified osteoarthritis, unspecified site: M19.90

## 2022-06-28 LAB — COMPREHENSIVE METABOLIC PANEL
ALT: 39 U/L (ref 0–44)
AST: 41 U/L (ref 15–41)
Albumin: 4 g/dL (ref 3.5–5.0)
Alkaline Phosphatase: 69 U/L (ref 38–126)
Anion gap: 8 (ref 5–15)
BUN: 14 mg/dL (ref 6–20)
CO2: 27 mmol/L (ref 22–32)
Calcium: 9.1 mg/dL (ref 8.9–10.3)
Chloride: 104 mmol/L (ref 98–111)
Creatinine, Ser: 0.93 mg/dL (ref 0.44–1.00)
GFR, Estimated: >60 mL/min (ref >60)
Glucose, Bld: 145 mg/dL — ABNORMAL HIGH (ref 70–99)
Potassium: 3.4 mmol/L — ABNORMAL LOW (ref 3.5–5.1)
Sodium: 139 mmol/L (ref 135–145)
Total Bilirubin: 0.7 mg/dL (ref 0.3–1.2)
Total Protein: 7.5 g/dL (ref 6.5–8.1)

## 2022-06-28 LAB — URINALYSIS, ROUTINE W REFLEX MICROSCOPIC
Bilirubin Urine: NEGATIVE
Glucose, UA: NEGATIVE mg/dL
Hgb urine dipstick: NEGATIVE
Ketones, ur: NEGATIVE mg/dL
Nitrite: NEGATIVE
Protein, ur: NEGATIVE mg/dL
Specific Gravity, Urine: 1.019 (ref 1.005–1.030)
pH: 5 (ref 5.0–8.0)

## 2022-06-28 LAB — CBC
HCT: 42 % (ref 36.0–46.0)
Hemoglobin: 13.6 g/dL (ref 12.0–15.0)
MCH: 29 pg (ref 26.0–34.0)
MCHC: 32.4 g/dL (ref 30.0–36.0)
MCV: 89.6 fL (ref 80.0–100.0)
Platelets: 331 10*3/uL (ref 150–400)
RBC: 4.69 MIL/uL (ref 3.87–5.11)
RDW: 12.6 % (ref 11.5–15.5)
WBC: 10.9 10*3/uL — ABNORMAL HIGH (ref 4.0–10.5)
nRBC: 0 % (ref 0.0–0.2)

## 2022-06-28 LAB — HEMOGLOBIN A1C
Hgb A1c MFr Bld: 6.4 % — ABNORMAL HIGH (ref 4.8–5.6)
Mean Plasma Glucose: 136.98 mg/dL

## 2022-06-28 LAB — C-REACTIVE PROTEIN: CRP: 1.1 mg/dL — ABNORMAL HIGH (ref <1.0)

## 2022-06-28 LAB — SEDIMENTATION RATE: Sed Rate: 4 mm/hr (ref 0–30)

## 2022-06-28 LAB — TYPE AND SCREEN
ABO/RH(D): O NEG
Antibody Screen: NEGATIVE

## 2022-06-28 LAB — SURGICAL PCR SCREEN
MRSA, PCR: NEGATIVE
Staphylococcus aureus: NEGATIVE

## 2022-06-28 NOTE — Patient Instructions (Addendum)
Your procedure is scheduled on: 07/06/22 - Friday Report to the Registration Desk on the 1st floor of the [facility]. To find out your arrival time, please call [phone] between 1PM - 3PM on: 07/05/22 - Thursday If your arrival time is 6:00 am, do not arrive prior to that time as the [facility] entrance doors do not open until 6:00 am.  REMEMBER: Instructions that are not followed completely may result in serious medical risk, up to and including death; or upon the discretion of your surgeon and anesthesiologist your surgery may need to be rescheduled.  Do not eat food after midnight the night before surgery.  No gum chewing, lozengers or hard candies.  You may however, drink CLEAR liquids up to 2 hours before you are scheduled to arrive for your surgery. Do not drink anything within 2 hours of your scheduled arrival time.  Clear liquids include: - water  - Sabas juice without pulp - gatorade (not RED colors) - black coffee or tea (Do NOT add milk or creamers to the coffee or tea) Do NOT drink anything that is not on this list.  TAKE THESE MEDICATIONS THE MORNING OF SURGERY WITH A SIP OF WATER:  - busPIRone (BUSPAR) if needed - citalopram (CELEXA) - levothyroxine (SYNTHROID)   One week prior to surgery: Stop Anti-inflammatories (NSAIDS) such as Advil, Aleve, Ibuprofen, Motrin, Naproxen, Naprosyn and Aspirin based products such as Excedrin, Goodys Powder, BC Powder.  Stop ANY OVER THE COUNTER supplements until after surgery : Coenzyme Q10 , fish oil-omega-3 fatty acid , Multiple Vitamin , Vitamin D3,   You may however, continue to take Tylenol if needed for pain up until the day of surgery.  No Alcohol for 24 hours before or after surgery.  No Smoking including e-cigarettes for 24 hours prior to surgery.  No chewable tobacco products for at least 6 hours prior to surgery.  No nicotine patches on the day of surgery.  Do not use any "recreational" drugs for at least a  week prior to your surgery.  Please be advised that the combination of cocaine and anesthesia may have negative outcomes, up to and including death. If you test positive for cocaine, your surgery will be cancelled.  On the morning of surgery brush your teeth with toothpaste and water, you may rinse your mouth with mouthwash if you wish. Do not swallow any toothpaste or mouthwash.  Use CHG Soap or wipes as directed on instruction sheet.  Do not wear jewelry, make-up, hairpins, clips or nail polish.  Do not wear lotions, powders, or perfumes.   Do not shave body from the neck down 48 hours prior to surgery just in case you cut yourself which could leave a site for infection.  Also, freshly shaved skin may become irritated if using the CHG soap.  Contact lenses, hearing aids and dentures may not be worn into surgery.  Do not bring valuables to the hospital. [facility] is not responsible for any missing/lost belongings or valuables.   Notify your doctor if there is any change in your medical condition (cold, fever, infection).  Wear comfortable clothing (specific to your surgery type) to the hospital.  After surgery, you can help prevent lung complications by doing breathing exercises.  Take deep breaths and cough every 1-2 hours. Your doctor may order a device called an Incentive Spirometer to help you take deep breaths. When coughing or sneezing, hold a pillow firmly against your incision with both hands. This is called "splinting."  Doing this helps protect your incision. It also decreases belly discomfort.  If you are being admitted to the hospital overnight, leave your suitcase in the car. After surgery it may be brought to your room.  If you are being discharged the day of surgery, you will not be allowed to drive home. You will need a responsible adult (18 years or older) to drive you home and stay with you that night.   If you are taking public transportation, you will need to  have a responsible adult (18 years or older) with you. Please confirm with your physician that it is acceptable to use public transportation.   Please call the Pre-admissions Testing Dept. at [phone] if you have any questions about these instructions.  Surgery Visitation Policy:  Patients undergoing a surgery or procedure may have two family members or support persons with them as long as the person is not COVID-19 positive or experiencing its symptoms.   Inpatient Visitation:    Visiting hours are 7 a.m. to 8 p.m. Up to four visitors are allowed at one time in a patient room, including children. The visitors may rotate out with other people during the day. One designated support person (adult) may remain overnight.

## 2022-06-29 ENCOUNTER — Ambulatory Visit: Payer: Managed Care, Other (non HMO) | Admitting: Internal Medicine

## 2022-06-29 ENCOUNTER — Encounter: Payer: Self-pay | Admitting: Internal Medicine

## 2022-06-29 VITALS — BP 130/80 | HR 66 | Temp 97.9°F | Ht 64.0 in | Wt 239.0 lb

## 2022-06-29 DIAGNOSIS — I10 Essential (primary) hypertension: Secondary | ICD-10-CM

## 2022-06-29 DIAGNOSIS — Z01818 Encounter for other preprocedural examination: Secondary | ICD-10-CM

## 2022-06-29 DIAGNOSIS — R739 Hyperglycemia, unspecified: Secondary | ICD-10-CM | POA: Diagnosis not present

## 2022-06-29 DIAGNOSIS — E876 Hypokalemia: Secondary | ICD-10-CM

## 2022-06-29 DIAGNOSIS — R9431 Abnormal electrocardiogram [ECG] [EKG]: Secondary | ICD-10-CM

## 2022-06-29 DIAGNOSIS — E7800 Pure hypercholesterolemia, unspecified: Secondary | ICD-10-CM

## 2022-06-29 LAB — URINE CULTURE: Culture: <10000 — AB

## 2022-06-29 NOTE — Progress Notes (Unsigned)
Patient ID: Kristen Aguilar, female   DOB: 1966-08-05, 56 y.o.   MRN: [id number]   Subjective:    Patient ID: Kristen Aguilar, female    DOB: 27-Jan-1966, 56 y.o.   MRN: [id number]   Patient here for  Chief Complaint  Patient presents with   Pre-op Exam    Pre op for ortho surgery   .   HPI Received call - ortho - abnormal EKG.  Scheduled for surgery 07/06/22.  Was asked to come in for evaluation.  Persistent knee pain. Limits activity.  Does report when she is her most active - working outside - reports sob with increased exertion.  No chest pain.  No cough or congestion.  No acid reflux or abdominal pain reported.  No bowel change reported.     Past Medical History:  Diagnosis Date   Allergy    Anemia    as a child   Anxiety    Arthritis    Depression    Gestational diabetes    Hypertension    Hypothyroidism    Pneumonia    as a child   PONV (postoperative nausea and vomiting)    with c sections   Pre-diabetes    Past Surgical History:  Procedure Laterality Date   CESAREAN SECTION  2001 & 2004   COLONOSCOPY WITH PROPOFOL N/A 06/09/2018   Procedure: COLONOSCOPY WITH PROPOFOL;  Surgeon: Jonathon Bellows, MD;  Location: [facility] ENDOSCOPY;  Service: Gastroenterology;  Laterality: N/A;   Family History  Problem Relation Age of Onset   Hypertension Mother    Hypertension Father    Hypertension Maternal Grandmother    Diabetes Maternal Grandmother    Breast cancer Paternal Grandmother    Social History   Socioeconomic History   Marital status: Married    Spouse name: Elenore Rota   Number of children: 2   Years of education: Not on file   Highest education level: Not on file  Occupational History   Not on file  Tobacco Use   Smoking status: Never   Smokeless tobacco: Never  Vaping Use   Vaping Use: Never used  Substance and Sexual Activity   Alcohol use: No    Alcohol/week: 0.0 standard drinks of alcohol   Drug use: No   Sexual activity: Not on file  Other Topics  Concern   Not on file  Social History Narrative   Not on file   Social Determinants of Health   Financial Resource Strain: Not on file  Food Insecurity: Not on file  Transportation Needs: Not on file  Physical Activity: Not on file  Stress: Not on file  Social Connections: Not on file     Review of Systems  Constitutional:  Negative for fatigue and unexpected weight change.  Respiratory:  Negative for cough and chest tightness.   Cardiovascular:  Negative for chest pain, palpitations and leg swelling.       SOB with increased activity.   Gastrointestinal:  Negative for abdominal pain, diarrhea, nausea and vomiting.  Genitourinary:  Negative for difficulty urinating and dysuria.  Musculoskeletal:  Negative for joint swelling and myalgias.  Skin:  Negative for color change and rash.  Neurological:  Negative for dizziness, light-headedness and headaches.  Psychiatric/Behavioral:  Negative for agitation and dysphoric mood.        Objective:     BP 130/80 (BP Location: Left Arm, Patient Position: Sitting, Cuff Size: Large)   Pulse 66   Temp 97.9 F (36.6 C) (Oral)  Ht 5' 4" (1.626 m)   Wt 239 lb (108.4 kg)   SpO2 97%   BMI 41.02 kg/m  Wt Readings from Last 3 Encounters:  06/29/22 239 lb (108.4 kg)  06/28/22 239 lb 10.2 oz (108.7 kg)  06/05/22 239 lb 12.8 oz (108.8 kg)    Physical Exam Vitals reviewed.  Constitutional:      General: She is not in acute distress.    Appearance: Normal appearance.  HENT:     Head: Normocephalic and atraumatic.     Right Ear: External ear normal.     Left Ear: External ear normal.  Eyes:     General: No scleral icterus.       Right eye: No discharge.        Left eye: No discharge.     Conjunctiva/sclera: Conjunctivae normal.  Neck:     Thyroid: No thyromegaly.  Cardiovascular:     Rate and Rhythm: Normal rate and regular rhythm.  Pulmonary:     Effort: No respiratory distress.     Breath sounds: Normal breath sounds. No  wheezing.  Abdominal:     General: Bowel sounds are normal.     Palpations: Abdomen is soft.     Tenderness: There is no abdominal tenderness.  Musculoskeletal:        General: No swelling or tenderness.     Cervical back: Neck supple. No tenderness.  Lymphadenopathy:     Cervical: No cervical adenopathy.  Skin:    Findings: No erythema or rash.  Neurological:     Mental Status: She is alert.  Psychiatric:        Mood and Affect: Mood normal.        Behavior: Behavior normal.      Outpatient Encounter Medications as of 06/29/2022  Medication Sig   acetaminophen (TYLENOL) 650 MG CR tablet Take 650 mg by mouth 2 (two) times daily.   busPIRone (BUSPAR) 5 MG tablet Take 1 tablet (5 mg total) by mouth 2 (two) times daily as needed.   cetirizine (ZYRTEC) 10 MG tablet Take 10 mg by mouth daily.   Cholecalciferol (VITAMIN D3 PO) Take 1,000 mcg by mouth daily.   citalopram (CELEXA) 20 MG tablet TAKE 1 AND 1/2 TABLETS BY  MOUTH DAILY   Coenzyme Q10 (CO Q 10) 100 MG CAPS Take 50 mg by mouth daily.    fish oil-omega-3 fatty acids 1000 MG capsule Take 2 g by mouth daily.   fluticasone (FLONASE) 50 MCG/ACT nasal spray USE 2 SPRAYS IN BOTH  NOSTRILS DAILY   Inulin (FIBER CHOICE PREBIOTIC FIBER) 1.5 g CHEW Chew 1 tablet by mouth daily. Takes 1/4 chew daily   levothyroxine (SYNTHROID) 75 MCG tablet TAKE 1 TABLET BY MOUTH  DAILY BEFORE BREAKFAST   lisinopril (ZESTRIL) 20 MG tablet Take 1 tablet (20 mg total) by mouth daily.   loperamide (IMODIUM A-D) 2 MG tablet Take 2 mg by mouth 4 (four) times daily as needed for diarrhea or loose stools.   Multiple Vitamin (MULTIVITAMIN) tablet Take 1 tablet by mouth daily.   OVER THE COUNTER MEDICATION as needed. Emu cream blue rub topical   OVER THE COUNTER MEDICATION as needed. Equate- Childrens cough and cold syrup   rosuvastatin (CRESTOR) 20 MG tablet Take 1 tablet (20 mg total) by mouth daily.   No facility-administered encounter medications on file as  of 06/29/2022.     Lab Results  Component Value Date   WBC 10.9 (H) 06/28/2022   HGB 13.6  06/28/2022   HCT 42.0 06/28/2022   PLT 331 06/28/2022   GLUCOSE 145 (H) 06/28/2022   CHOL 133 12/22/2021   TRIG 152 (H) 12/22/2021   HDL 49 12/22/2021   LDLDIRECT 135.3 07/20/2014   LDLCALC 58 12/22/2021   ALT 39 06/28/2022   AST 41 06/28/2022   NA 139 06/28/2022   K 3.4 (L) 06/28/2022   CL 104 06/28/2022   CREATININE 0.93 06/28/2022   BUN 14 06/28/2022   CO2 27 06/28/2022   TSH 2.820 08/14/2021   HGBA1C 6.4 (H) 06/28/2022       Assessment & Plan:   Problem List Items Addressed This Visit     Abnormal EKG    EKG = SR with TWI V1- V5.  No old EKG to compare.  Some sob with increased activity - working outside.  Given EKG changes and sob, discussed recommendation for further cardiac w/up.  Surgery scheduled for 07/06/22.  Cardiology evaluation prior.        Relevant Orders   Ambulatory referral to Cardiology   Hypercholesterolemia    Currently on crestor 39m q day.  Low cholesterol diet and exercise.  Follow lipid panel and liver function tests.  Discussed recent labs.       Hyperglycemia    Low carb diet and exercise.  Follow met b and a1c.  Has cut out snacks.       Hypertension    Blood pressure as outlined.  Continue lisinopril.  Follow pressures.  Follow metabolic panel.       Hypokalemia    Will need potassium rechecked prior to surgery.       Other Visit Diagnoses     Pre-op examination    -  Primary   Relevant Orders   EKG 12-Lead (Completed)   Ambulatory referral to Cardiology        CEinar Pheasant MD

## 2022-06-29 NOTE — TOC Progression Note (Signed)
Transition of Care [facility]) - Progression Note    Patient Details  Name: Kristen Aguilar MRN: [id number] Date of Birth: 20-Jul-1966  Transition of Care [facility]) CM/SW Contact  Marlowe Sax, RN Phone Number: 06/29/2022, 3:08 PM  Clinical Narrative:     Patient attended the joint class and completed the questionnaire indicating that they will need a 3 in 1, [employer] to deliver to the home prior to surgery       Expected Discharge Plan and Services                                                 Social Determinants of Health (SDOH) Interventions    Readmission Risk Interventions     No data to display

## 2022-06-29 NOTE — Progress Notes (Signed)
  Perioperative Services Pre-Admission/Anesthesia Testing    Date: 06/29/22  Name: VANICE RAPPA MRN:   [id number]  Re: ECG changes  Planned Surgical Procedure(s):    Case: [id number] Date/Time: 07/06/22 0715   Procedure: COMPUTER ASSISTED TOTAL KNEE ARTHROPLASTY - RNFA (Right: Knee)   Anesthesia type: Choice   Pre-op diagnosis: PRIMARY OSTEOARTHRITIS OF RIGHT KNEE.   Location: [facility] OR ROOM 01 / [facility] ORS FOR ANESTHESIA GROUP   Surgeons: Donato Heinz, MD   Clinical Notes:  Patient is scheduled for the above procedure on 07/06/2022 with Dr. Francesco Sor, MD.  In preparation for the procedure, patient presented to the PAT clinic on the afternoon of 06/28/2022 for preoperative labs and ECG testing.  In review of her preoperative ECG, there are diffuse T wave changes noted inferiorly and the precordial leads.  There is high lateral T wave flattening noted as well.  Unfortunately, there are no other ECG tracings on record for review/comparison.      Patient denied a significant CV history, however she does have several risk factors in place including, but not limited to: age, HTN, prediabetes, obesity. I do not see evidence of a reported family history of heart disease. Reached out to patient's primary care provider Lorin Picket, MD) to determine whether or not patient potentially had an ECG on file with her office.  MD advising that she had not comparison.  Given the noted changes that are presumably acute, MD asking that patient be seen in the office for further evaluation.  Dr. Lorin Picket to have her office schedule an appointment to be seen.  I will plan on following up with Dr. Roby Lofts office on 06/29/2022 in efforts to ensure expedited appointment given patient's upcoming surgery.  MD noted that she may elect to involve cardiology after her evaluation of the patient.  Impression and Plan:  Regene Mccarthy Popko is scheduled for surgery on 07/06/2022. Given preoperative ECG changes, patient is going  to need further follow up with her PCP and potentially cardiology. No changes are being made to the OR schedule at this point. I will however, send copy of this note to surgeon Ernest Pine, MD) to make him aware.  I will follow progress of appointment once scheduled to determine if Dr. Lorin Picket feels as if cardiology input is warranted. Will plan on keeping surgeon updated on disposition and need for potential changes to her surgical date should further cardiovascular evaluation/testing be recommended prior to proceeding with elective orthopedic surgery.   Quentin Mulling, MSN, APRN, FNP-C, CEN [facility]  Peri-operative Services Nurse Practitioner Phone: [phone] 06/29/22 6:33 AM  NOTE: This note has been prepared using Dragon dictation software. Despite my best ability to proofread, there is always the potential that unintentional transcriptional errors may still occur from this process.

## 2022-06-30 ENCOUNTER — Encounter: Payer: Self-pay | Admitting: Internal Medicine

## 2022-06-30 DIAGNOSIS — E876 Hypokalemia: Secondary | ICD-10-CM | POA: Insufficient documentation

## 2022-06-30 DIAGNOSIS — R9431 Abnormal electrocardiogram [ECG] [EKG]: Secondary | ICD-10-CM | POA: Insufficient documentation

## 2022-06-30 LAB — IGE: IgE (Immunoglobulin E), Serum: 67 IU/mL (ref 6–495)

## 2022-06-30 NOTE — Assessment & Plan Note (Addendum)
Currently on crestor 20mg  q day.  Low cholesterol diet and exercise.  Follow lipid panel and liver function tests.  Discussed recent labs.

## 2022-06-30 NOTE — Assessment & Plan Note (Signed)
EKG = SR with TWI V1- V5.  No old EKG to compare.  Some sob with increased activity - working outside.  Given EKG changes and sob, discussed recommendation for further cardiac w/up.  Surgery scheduled for 07/06/22.  Cardiology evaluation prior.

## 2022-06-30 NOTE — Assessment & Plan Note (Signed)
Will need potassium rechecked prior to surgery.

## 2022-06-30 NOTE — Assessment & Plan Note (Signed)
Blood pressure as outlined.  Continue lisinopril.  Follow pressures.  Follow metabolic panel.   

## 2022-06-30 NOTE — Assessment & Plan Note (Signed)
Low carb diet and exercise.  Follow met b and a1c.  Has cut out snacks.

## 2022-07-05 ENCOUNTER — Encounter: Payer: Self-pay | Admitting: Cardiology

## 2022-07-05 ENCOUNTER — Ambulatory Visit: Payer: Managed Care, Other (non HMO) | Attending: Cardiology | Admitting: Cardiology

## 2022-07-05 VITALS — BP 140/78 | HR 76 | Ht 64.0 in | Wt 238.0 lb

## 2022-07-05 DIAGNOSIS — Z01810 Encounter for preprocedural cardiovascular examination: Secondary | ICD-10-CM | POA: Insufficient documentation

## 2022-07-05 DIAGNOSIS — E7849 Other hyperlipidemia: Secondary | ICD-10-CM | POA: Diagnosis not present

## 2022-07-05 DIAGNOSIS — I10 Essential (primary) hypertension: Secondary | ICD-10-CM

## 2022-07-05 DIAGNOSIS — R9431 Abnormal electrocardiogram [ECG] [EKG]: Secondary | ICD-10-CM

## 2022-07-05 NOTE — Patient Instructions (Addendum)
Medication Instructions:  - Your physician recommends that you continue on your current medications as directed. Please refer to the Current Medication list given to you today. o *If you need a refill on your cardiac medications before your next appointment, please call your pharmacy*   Lab Work: - none ordered  If you have labs (blood work) drawn today and your tests are completely normal, you will receive your results only by: [place] (if you have MyChart) OR A paper copy in the mail If you have any lab test that is abnormal or we need to change your treatment, we will call you to review the results.   Testing/Procedures: - none ordered   Follow-Up: At [facility], you and your health needs are our priority.  As part of our continuing mission to provide you with exceptional heart care, we have created designated Provider Care Teams.  These Care Teams include your primary Cardiologist (physician) and Advanced Practice Providers (APPs -  Physician Assistants and Nurse Practitioners) who all work together to provide you with the care you need, when you need it.  We recommend signing up for the patient portal called "MyChart".  Sign up information is provided on this After Visit Summary.  MyChart is used to connect with patients for Virtual Visits (Telemedicine).  Patients are able to view lab/test results, encounter notes, upcoming appointments, etc.  Non-urgent messages can be sent to your provider as well.   To learn more about what you can do with MyChart, go to [email or web address].    Your next appointment:   6 week(s)  The format for your next appointment:   In Person  Provider:   You may see Glenetta Hew, MD or one of the following Advanced Practice Providers on your designated Care Team:   Murray Hodgkins, NP Christell Faith, PA-C Cadence Kathlen Mody, PA-C Gerrie Nordmann, NP    Other Instructions  After today's cardiac evaluation, you are considered to be a  low cardiovascular risk. Therefore, you may proceed with your surgery as planned.   Important Information About Sugar

## 2022-07-05 NOTE — Progress Notes (Signed)
Primary Care Provider: Dale [place], MD [facility] Cardiologist: None Electrophysiologist: None Orthopedic Surgeon: Dr. Ernest Pine  Clinic Note: Chief Complaint  Patient presents with   New Patient (Initial Visit)    Ref by Dale [place], MD for abnormal EKG & is scheduled to have a right knee replacement tomorrow with Dr. Ernest Pine. Patient c/o shortness of breath with over exertion. Medications reviewed by the patient verbally.    ===================================  ASSESSMENT/PLAN   Problem List Items Addressed This Visit       Cardiology Problems   Essential hypertension (Chronic)    She is on lisinopril.  Blood pressures have been pretty well controlled.  She is little anxious today.  Had be had the time preoperatively could have consider beta-blocker for preoperative risk reduction, however 1 day is not enough (1 month is not enough).  For now continue lisinopril.  Defer to PCP.      Hyperlipidemia due to dietary fat intake (Chronic)    Excellent lipid control based on labs as of March.  She takes 20 mg rosuvastatin and well controlled.  For further risk stratification, in the future may consider coronary calcium scoring however would not change management at this time.  She is already on appropriate therapy.        Other   Preoperative cardiovascular examination - Primary    Patient with no prior cardiac history or any family history of CAD or premature CAD referred because of an abnormal EKG as part of preop evaluation.  She is simply asymptomatic with exception of exertional dyspnea that could be explained by her deconditioning related to obesity and ongoing at osteoarthritic pains of the knee limiting her walking.  She has not complained of any symptoms to suggest angina or heart failure. She is nondiabetic with normal renal function and no history of stroke.  PREOPERATIVE CARDIAC RISK ASSESSMENT   Revised Cardiac Risk Index: High Risk Surgery: no;  low risk surgery Defined as Intraperitoneal, intrathoracic or suprainguinal vascular Active CAD: no; no active symptoms and no reported history. CHF: no;Only swelling is at the right knee.  No PND, or orthopnea* Cerebrovascular Disease: no; Diabetes: no; On Insulin: no CKD (Cr >~ 2): no; creatinine 0.93  Total: 0 Estimated Risk of Adverse Outcome: Low-very low Estimated Risk of MI, PE, VF/VT (Cardiac Arrest), Complete Heart Block: <1%   ACC/AHA Guidelines for "Clearance": Step 1 - Need for Emergency Surgery: No: Nonemergent, but relatively urgent If Yes - go straight to OR with perioperative surveillance Step 2 - Active Cardiac Conditions (Unstable Angina, Decompensated HF, Significant  Arrhytmias - Complete HB, Mobitz II, Symptomatic VT or SVT, Severe Aortic Stenosis - mean gradient > 40 mmHg, Valve area < 1.0 cm2):  No: No symptoms If Yes - Evaluate & Treat per ACC/AHA Guidelines Step 3 -  Low Risk Surgery: Yes If Yes --> proceed to OR If No --> Step 4 Step 4 - Functional Capacity >= 4 METS without symptoms: No: Limited by knee pain. If Yes --> proceed to OR If No --> Step 5 Step 5 --  Clinical Risk Factors (CRF)  3 or more: No:  If Yes -- assess Surgical Risk, --  (High Risk Non-cardiac), Intraabdominal or thoracic vascular surgery consider testing if it will change management. Intermediate Risk: Proceed to OR with HR control, or consider testing if it will change management 1-2 or more CRFs: Yes-hypertension, hyperlipidemia If Yes -- assess Surgical Risk, --  (High Risk Non-cardiac), Intraabdominal or thoracic vascular surgery -->  Proceed to OR, or consider testing if it will change management. Intermediate Risk: Proceed to OR with HR control, or consider testing if it will change management Low risk-proceed OR  Patient is relatively asymptomatic from a car standpoint, and has greater risk factors of hypertension and hyperlipidemia both well treated.  EKG is mildly abnormal  however in the absence of symptoms is not a reason enough to delay surgery.  Testing would not change clinical management, nor is it indicated in the setting of low risk surgery.  Recommendation: Proceed with the OR without any further cardiac evaluation.      Nonspecific abnormal electrocardiogram (ECG) (EKG) (Chronic)    She does have an EKG that is not normal.  However she is not having active symptoms and we do not have any prior EKGs to review.  EKG changes alone should not prevent her from having her surgery.  Clinically, she is low risk for low risk surgery.  For evaluation of the EKG and the future, we could consider further testing, but this is not necessary prior to her surgery, and should not delay her surgery.      Relevant Orders   EKG 12-Lead    Majority the time spent after obtaining clinical history was discussing preoperative risk assessment.  The essence of the visit is to determine if she is safe for surgery.  In a relatively asymptomatic person having a low risk surgery, she should be low risk for any procedures.  I did tell her that this is only risk assessment and not prediction. She does have an abnormal EKG which warrants evaluation in the future, however it should not delay surgery.  She is very symptomatic from her knee and needs it operated on.  Further evaluation of the EKG would lead to testing that would only serve to delay her surgery and potentially inadvertently place her at increased risk for other complications.  Where she to have cardiovascular evaluation that was abnormal, this would only delay her surgery even more and then potentially lead to an invasive procedure that again would delay her and also introduce the risk of invasive procedure.  Where she to undergo cardiac catheterization and end up having a lesion, then that leaves the question what should be done with lesion.  Treating her with PCI with further delay her surgery.  Again another 3+ months and  then increase the risk of stent occlusion with having to stop clopidogrel at early stage to allow for her surgery.  Based on this rationale, I feel it is best for her to proceed with surgery.   Bryan Lemma, MD   ===================================  HPI:    Kristen Aguilar is a 56 y.o. female with PMH notable for HTN, HLD, hyperglycemia who is being seen today for the evaluation of abnormal EKG as part of preop evaluation for upcoming knee surgery (scheduled for tomorrow).  She is seen today at the request of Dale , MD.  Anastasya Jewell Stehr was last seen on 06/30/2022 by Dr. Lorin Picket.  EKG read as abnormal.  Noted some exertional dyspnea EKG = SR with TWI V1- V5.  No old EKG to compare.  Some sob with increased activity - working outside.  Given EKG changes and sob, discussed recommendation for further cardiac w/up.  Surgery scheduled for 07/06/22   Recent Hospitalizations: None  Reviewed  CV studies:    The following studies were reviewed today: (if available, images/films reviewed: From Epic Chart or Care Everywhere) None:  Interval History:  Adileny Delon Stammen presents here today 1 day prior to planned surgery for cardiology evaluation.  She does not have any prior cardiac history.  She does have hypertension which is being treated with an ACE inhibitor.  Her pressures are little bit high today but previous evaluation with her PCP and other doctors show that her pressures are usually lower than this.  She is somewhat stressed and anxious.  We reviewed her symptoms over the last year or so when she seems to be doing fine with no active cardiac symptoms.  She is extremely deconditioned, obese and has not been walking very much for the last year because of a bone-on-bone knee pain and therefore not understand he has some exertional dyspnea.  She is not having any chest pain or pressure at rest or exertion.  She has not had any symptoms whatsoever of irregular heartbeats or  arrhythmias.  No syncope/near syncope or TIA/amaurosis fugax.  No PND, orthopnea or edema besides some mild end of day swelling that goes down when she puts her feet up, this is mostly related to her knee swelling.  She is limited by knee pain walking.  She can stop below the knee knee pain recovering continue to walk and it is more of the discomfort in her knee not dyspnea that keeps her from walking.  She could probably walk a city block without the knee hurting her and that she would need to rest for the pain does subside and then she could continue to walking for her to get again another block.  Unfortunately, she does not have any other EKGs besides the one checked here today and by her PCP.  The 1 today actually is relatively similar to the EKG from her PCP  REVIEWED OF SYSTEMS   Review of Systems  Constitutional:  Positive for malaise/fatigue (Tires easily.  Very deconditioned.). Negative for weight loss.  HENT:  Negative for congestion and nosebleeds.   Cardiovascular:  Positive for leg swelling (Just the right knee).  Gastrointestinal:  Negative for blood in stool and melena.  Genitourinary:  Negative for dysuria, flank pain and frequency.  Musculoskeletal:  Positive for back pain and joint pain (Right knee).  Psychiatric/Behavioral:  Negative for memory loss. The patient is not nervous/anxious and does not have insomnia.    I have reviewed and (if needed) personally updated the patient's problem list, medications, allergies, past medical and surgical history, social and family history.   PAST MEDICAL HISTORY   Past Medical History:  Diagnosis Date   Allergy    Anemia    as a child   Anxiety    Arthritis    Depression    Gestational diabetes    Hyperlipidemia due to dietary fat intake 04/25/2014   Hypertension    Hypothyroidism    Pneumonia    as a child   PONV (postoperative nausea and vomiting)    with c sections   Pre-diabetes     PAST SURGICAL HISTORY   Past  Surgical History:  Procedure Laterality Date   CESAREAN SECTION  2001 & 2004   COLONOSCOPY WITH PROPOFOL N/A 06/09/2018   Procedure: COLONOSCOPY WITH PROPOFOL;  Surgeon: Wyline Mood, MD;  Location: [facility] ENDOSCOPY;  Service: Gastroenterology;  Laterality: N/A;    Immunization History  Administered Date(s) Administered   Influenza Split 07/18/2014   Influenza Whole 06/11/1981   Influenza,inj,Quad PF,6+ Mos 07/17/2018, 06/18/2019, 07/12/2020, 07/31/2021   Influenza-Unspecified 07/11/2015   Moderna Sars-Covid-2 Vaccination 06/24/2020, 07/31/2020   Tdap 04/02/2013  MEDICATIONS/ALLERGIES   Current Meds  Medication Sig   acetaminophen (TYLENOL) 650 MG CR tablet Take 650 mg by mouth 2 (two) times daily.   busPIRone (BUSPAR) 5 MG tablet Take 1 tablet (5 mg total) by mouth 2 (two) times daily as needed.   cetirizine (ZYRTEC) 10 MG tablet Take 10 mg by mouth daily.   Cholecalciferol (VITAMIN D3 PO) Take 1,000 mcg by mouth daily.   citalopram (CELEXA) 20 MG tablet TAKE 1 AND 1/2 TABLETS BY  MOUTH DAILY   Coenzyme Q10 (CO Q 10) 100 MG CAPS Take 50 mg by mouth daily.    fish oil-omega-3 fatty acids 1000 MG capsule Take 2 g by mouth daily.   fluticasone (FLONASE) 50 MCG/ACT nasal spray USE 2 SPRAYS IN BOTH  NOSTRILS DAILY   Inulin (FIBER CHOICE PREBIOTIC FIBER) 1.5 g CHEW Chew 1 tablet by mouth daily. Takes 1/4 chew daily   levothyroxine (SYNTHROID) 75 MCG tablet TAKE 1 TABLET BY MOUTH  DAILY BEFORE BREAKFAST   lisinopril (ZESTRIL) 20 MG tablet Take 1 tablet (20 mg total) by mouth daily.   loperamide (IMODIUM A-D) 2 MG tablet Take 2 mg by mouth 4 (four) times daily as needed for diarrhea or loose stools.   Multiple Vitamin (MULTIVITAMIN) tablet Take 1 tablet by mouth daily.   OVER THE COUNTER MEDICATION as needed. Emu cream blue rub topical   OVER THE COUNTER MEDICATION as needed. Equate- Childrens cough and cold syrup   rosuvastatin (CRESTOR) 20 MG tablet Take 1 tablet (20 mg total) by mouth  daily.    Allergies  Allergen Reactions   Penicillins     SOCIAL HISTORY/FAMILY HISTORY   Reviewed in Epic:   Social History   Tobacco Use   Smoking status: Never   Smokeless tobacco: Never  Vaping Use   Vaping Use: Never used  Substance Use Topics   Alcohol use: No    Alcohol/week: 0.0 standard drinks of alcohol   Drug use: No   Social History   Social History Narrative   Not on file   Family History  Problem Relation Age of Onset   Arrhythmia Mother        A-FIB   Hypertension Mother    Arrhythmia Father        A-FIB   Hypertension Father    Fibromyalgia Sister    Hypertension Maternal Grandmother    Diabetes Maternal Grandmother    Breast cancer Paternal Grandmother     OBJCTIVE -PE, EKG, labs   Wt Readings from Last 3 Encounters:  07/05/22 238 lb (108 kg)  06/29/22 239 lb (108.4 kg)  06/28/22 239 lb 10.2 oz (108.7 kg)    Physical Exam: BP (!) 140/78 (BP Location: Right Arm, Patient Position: Sitting, Cuff Size: Large)   Pulse 76   Ht 5\' 4"  (1.626 m)   Wt 238 lb (108 kg)   SpO2 98%   BMI 40.85 kg/m  Physical Exam Vitals reviewed.  Constitutional:      General: She is not in acute distress.    Appearance: Normal appearance. She is obese. She is not ill-appearing or toxic-appearing.  HENT:     Head: Normocephalic and atraumatic.  Neck:     Vascular: No carotid bruit or JVD.  Cardiovascular:     Rate and Rhythm: Normal rate and regular rhythm. No extrasystoles are present.    Chest Wall: PMI is not displaced.     Pulses: Intact distal pulses.     Heart sounds: S1 normal  and S2 normal. Heart sounds are distant. No murmur heard.    No friction rub. No gallop.  Musculoskeletal:     Cervical back: Normal range of motion and neck supple.  Neurological:     Mental Status: She is alert.      Adult ECG Report  Rate: Every 6;  Rhythm: normal sinus rhythm and nonspecific ST and T wave changes with T wave inversions in precordial leads.  Per  convention, cannot exclude anterolateral ischemia.  The T wave inversions appear to be asymmetrical in nature and therefore more consistent with nonspecific repolarization changes as opposed to ischemic T wave inversions which would be symmetrical. ;   Narrative Interpretation: Borderline EKG with nonspecific changes.  Recent Labs: Reviewed.  Notable improvement in LDL well within goal. Lab Results  Component Value Date   CHOL 133 12/22/2021   HDL 49 12/22/2021   LDLCALC 58 12/22/2021   LDLDIRECT 135.3 07/20/2014   TRIG 152 (H) 12/22/2021   CHOLHDL 2.7 12/22/2021   Lab Results  Component Value Date   CREATININE 0.93 06/28/2022   BUN 14 06/28/2022   NA 139 06/28/2022   K 3.4 (L) 06/28/2022   CL 104 06/28/2022   CO2 27 06/28/2022      Latest Ref Rng & Units 06/28/2022    2:13 PM 05/09/2021    9:43 AM 11/23/2020    9:09 AM  CBC  WBC 4.0 - 10.5 K/uL 10.9  9.4  9.4   Hemoglobin 12.0 - 15.0 g/dL 13.6  12.9  14.6   Hematocrit 36.0 - 46.0 % 42.0  38.3  45.3   Platelets 150 - 400 K/uL 331  298  365     Lab Results  Component Value Date   HGBA1C 6.4 (H) 06/28/2022   Lab Results  Component Value Date   TSH 2.820 08/14/2021    ================================================== I spent a total of 23 minutes with the patient spent in direct patient consultation.  Additional time spent with chart review  / charting (studies, outside notes, etc): 28 min Total Time: 51 min  Current medicines are reviewed at length with the patient today.  (+/- concerns) n/a  Notice: This dictation was prepared with Dragon dictation along with smart phrase technology. Any transcriptional errors that result from this process are unintentional and may not be corrected upon review.   Studies Ordered:  Orders Placed This Encounter  Procedures   EKG 12-Lead   No orders of the defined types were placed in this encounter.   Patient Instructions / Medication Changes & Studies & Tests Ordered    Patient Instructions  Medication Instructions:  - Your physician recommends that you continue on your current medications as directed. Please refer to the Current Medication list given to you today. o *If you need a refill on your cardiac medications before your next appointment, please call your pharmacy*   Lab Work: - none ordered  If you have labs (blood work) drawn today and your tests are completely normal, you will receive your results only by: [place] (if you have MyChart) OR A paper copy in the mail If you have any lab test that is abnormal or we need to change your treatment, we will call you to review the results.   Testing/Procedures: - none ordered   Follow-Up: At [facility], you and your health needs are our priority.  As part of our continuing mission to provide you with exceptional heart care, we have created designated Provider Care  Teams.  These Care Teams include your primary Cardiologist (physician) and Advanced Practice Providers (APPs -  Physician Assistants and Nurse Practitioners) who all work together to provide you with the care you need, when you need it.  We recommend signing up for the patient portal called "MyChart".  Sign up information is provided on this After Visit Summary.  MyChart is used to connect with patients for Virtual Visits (Telemedicine).  Patients are able to view lab/test results, encounter notes, upcoming appointments, etc.  Non-urgent messages can be sent to your provider as well.   To learn more about what you can do with MyChart, go to [email or web address].    Your next appointment:   6 week(s)  The format for your next appointment:   In Person  Provider:   You may see Bryan Lemma, MD or one of the following Advanced Practice Providers on your designated Care Team:   Nicolasa Ducking, NP Eula Listen, PA-C Cadence Fransico Michael, PA-C Charlsie Quest, NP    Other Instructions  After today's cardiac evaluation,  you are considered to be a low cardiovascular risk. Therefore, you may proceed with your surgery as planned.   Important Information About Sugar           Marykay Lex, MD, MS Bryan Lemma, M.D., M.S. Interventional Cardiologist  [facility]   [address] [place], [place]  [address] [phone]           Fax [phone]    Thank you for choosing [facility] in [place]!!

## 2022-07-05 NOTE — Assessment & Plan Note (Signed)
She does have an EKG that is not normal.  However she is not having active symptoms and we do not have any prior EKGs to review.  EKG changes alone should not prevent her from having her surgery.  Clinically, she is low risk for low risk surgery.  For evaluation of the EKG and the future, we could consider further testing, but this is not necessary prior to her surgery, and should not delay her surgery.

## 2022-07-05 NOTE — Assessment & Plan Note (Signed)
Excellent lipid control based on labs as of March.  She takes 20 mg rosuvastatin and well controlled.  For further risk stratification, in the future may consider coronary calcium scoring however would not change management at this time.  She is already on appropriate therapy.

## 2022-07-05 NOTE — Assessment & Plan Note (Signed)
She is on lisinopril.  Blood pressures have been pretty well controlled.  She is little anxious today.  Had be had the time preoperatively could have consider beta-blocker for preoperative risk reduction, however 1 day is not enough (1 month is not enough).  For now continue lisinopril.  Defer to PCP.

## 2022-07-05 NOTE — Assessment & Plan Note (Signed)
Patient with no prior cardiac history or any family history of CAD or premature CAD referred because of an abnormal EKG as part of preop evaluation.  She is simply asymptomatic with exception of exertional dyspnea that could be explained by her deconditioning related to obesity and ongoing at osteoarthritic pains of the knee limiting her walking.  She has not complained of any symptoms to suggest angina or heart failure. She is nondiabetic with normal renal function and no history of stroke.  PREOPERATIVE CARDIAC RISK ASSESSMENT   Revised Cardiac Risk Index:  High Risk Surgery: no; low risk surgery  Defined as Intraperitoneal, intrathoracic or suprainguinal vascular  Active CAD: no; no active symptoms and no reported history.  CHF: no;Only swelling is at the right knee.  No PND, or orthopnea*  Cerebrovascular Disease: no;  Diabetes: no; On Insulin: no  CKD (Cr >~ 2): no; creatinine 0.93  Total: 0 Estimated Risk of Adverse Outcome: Low-very low Estimated Risk of MI, PE, VF/VT (Cardiac Arrest), Complete Heart Block: <1%   ACC/AHA Guidelines for "Clearance":  Step 1 - Need for Emergency Surgery: No: Nonemergent, but relatively urgent  If Yes - go straight to OR with perioperative surveillance  Step 2 - Active Cardiac Conditions (Unstable Angina, Decompensated HF, Significant  Arrhytmias - Complete HB, Mobitz II, Symptomatic VT or SVT, Severe Aortic Stenosis - mean gradient > 40 mmHg, Valve area < 1.0 cm2):   No: No symptoms  If Yes - Evaluate & Treat per ACC/AHA Guidelines  Step 3 -  Low Risk Surgery: Yes  If Yes --> proceed to OR  If No --> Step 4  Step 4 - Functional Capacity >= 4 METS without symptoms: No: Limited by knee pain.  If Yes --> proceed to OR  If No --> Step 5  Step 5 --  Clinical Risk Factors (CRF)   3 or more: No:   If Yes -- assess Surgical Risk, --   (High Risk Non-cardiac), Intraabdominal or thoracic vascular surgery consider testing if it  will change management.  Intermediate Risk: Proceed to OR with HR control, or consider testing if it will change management  1-2 or more CRFs: Yes-hypertension, hyperlipidemia  If Yes -- assess Surgical Risk, --   (High Risk Non-cardiac), Intraabdominal or thoracic vascular surgery --> Proceed to OR, or consider testing if it will change management.  Intermediate Risk: Proceed to OR with HR control, or consider testing if it will change management  Low risk-proceed OR  Patient is relatively asymptomatic from a car standpoint, and has greater risk factors of hypertension and hyperlipidemia both well treated.  EKG is mildly abnormal however in the absence of symptoms is not a reason enough to delay surgery.  Testing would not change clinical management, nor is it indicated in the setting of low risk surgery.  Recommendation: Proceed with the OR without any further cardiac evaluation.

## 2022-07-06 ENCOUNTER — Ambulatory Visit: Payer: Managed Care, Other (non HMO) | Admitting: Urgent Care

## 2022-07-06 ENCOUNTER — Encounter: Payer: Self-pay | Admitting: Orthopedic Surgery

## 2022-07-06 ENCOUNTER — Observation Stay: Payer: Managed Care, Other (non HMO)

## 2022-07-06 ENCOUNTER — Other Ambulatory Visit: Payer: Self-pay

## 2022-07-06 ENCOUNTER — Observation Stay
Admission: RE | Admit: 2022-07-06 | Discharge: 2022-07-07 | Disposition: A | Discharge status: Home Health | Payer: Managed Care, Other (non HMO) | Attending: Orthopedic Surgery | Admitting: Orthopedic Surgery

## 2022-07-06 ENCOUNTER — Encounter
Admission: RE | Disposition: A | Discharge status: Home Health | Payer: Self-pay | Source: Home / Self Care | Attending: Orthopedic Surgery

## 2022-07-06 DIAGNOSIS — Z96659 Presence of unspecified artificial knee joint: Secondary | ICD-10-CM

## 2022-07-06 DIAGNOSIS — E119 Type 2 diabetes mellitus without complications: Secondary | ICD-10-CM | POA: Insufficient documentation

## 2022-07-06 DIAGNOSIS — Z79899 Other long term (current) drug therapy: Secondary | ICD-10-CM | POA: Diagnosis not present

## 2022-07-06 DIAGNOSIS — E7800 Pure hypercholesterolemia, unspecified: Secondary | ICD-10-CM

## 2022-07-06 DIAGNOSIS — M1711 Unilateral primary osteoarthritis, right knee: Secondary | ICD-10-CM | POA: Diagnosis present

## 2022-07-06 DIAGNOSIS — D649 Anemia, unspecified: Secondary | ICD-10-CM

## 2022-07-06 DIAGNOSIS — R739 Hyperglycemia, unspecified: Secondary | ICD-10-CM

## 2022-07-06 DIAGNOSIS — Z880 Allergy status to penicillin: Secondary | ICD-10-CM

## 2022-07-06 DIAGNOSIS — I10 Essential (primary) hypertension: Secondary | ICD-10-CM | POA: Diagnosis not present

## 2022-07-06 DIAGNOSIS — Z6836 Body mass index (BMI) 36.0-36.9, adult: Secondary | ICD-10-CM

## 2022-07-06 DIAGNOSIS — E039 Hypothyroidism, unspecified: Secondary | ICD-10-CM | POA: Insufficient documentation

## 2022-07-06 HISTORY — PX: KNEE ARTHROPLASTY: SHX992

## 2022-07-06 LAB — ABO/RH: ABO/RH(D): O NEG

## 2022-07-06 SURGERY — ARTHROPLASTY, KNEE, TOTAL, USING IMAGELESS COMPUTER-ASSISTED NAVIGATION
Anesthesia: General | Site: Knee | Laterality: Right

## 2022-07-06 MED ORDER — FERROUS SULFATE 325 (65 FE) MG PO TABS
325.0000 mg | ORAL_TABLET | Freq: Two times a day (BID) | ORAL | Status: DC
  Administered 2022-07-06 – 2022-07-07 (×2): 325 mg via ORAL
  Filled 2022-07-06 (×2): qty 1

## 2022-07-06 MED ORDER — TRANEXAMIC ACID-NACL 1000-0.7 MG/100ML-% IV SOLN: 1000.0000 mg | Freq: Once | INTRAVENOUS | Status: AC

## 2022-07-06 MED ORDER — ONDANSETRON HCL 4 MG/2ML IJ SOLN
4.0000 mg | Freq: Four times a day (QID) | INTRAMUSCULAR | Status: DC | PRN
  Administered 2022-07-06: 4 mg via INTRAVENOUS
  Filled 2022-07-06: qty 2

## 2022-07-06 MED ORDER — LISINOPRIL 20 MG PO TABS
20.0000 mg | ORAL_TABLET | Freq: Every day | ORAL | Status: DC
  Administered 2022-07-06 – 2022-07-07 (×2): 20 mg via ORAL
  Filled 2022-07-06 (×2): qty 1

## 2022-07-06 MED ORDER — CELECOXIB 200 MG PO CAPS
200.0000 mg | ORAL_CAPSULE | Freq: Two times a day (BID) | ORAL | Status: DC
  Administered 2022-07-07: 200 mg via ORAL
  Filled 2022-07-06: qty 1

## 2022-07-06 MED ORDER — FLEET ENEMA 7-19 GM/118ML RE ENEM: 1.0000 | ENEMA | Freq: Once | RECTAL | Status: DC | PRN

## 2022-07-06 MED ORDER — PHENOL 1.4 % MT LIQD: 1.0000 | OROMUCOSAL | Status: DC | PRN

## 2022-07-06 MED ORDER — FLUTICASONE PROPIONATE 50 MCG/ACT NA SUSP: 2.0000 | Freq: Every day | NASAL | Status: DC | PRN

## 2022-07-06 MED ORDER — LACTATED RINGERS IV SOLN: INTRAVENOUS | Status: DC

## 2022-07-06 MED ORDER — TRANEXAMIC ACID-NACL 1000-0.7 MG/100ML-% IV SOLN
INTRAVENOUS | Status: AC
  Administered 2022-07-06: 1000 mg via INTRAVENOUS
  Filled 2022-07-06: qty 100

## 2022-07-06 MED ORDER — SODIUM CHLORIDE 0.9 % IR SOLN
Status: DC | PRN
  Administered 2022-07-06: 3000 mL

## 2022-07-06 MED ORDER — SURGIPHOR WOUND IRRIGATION SYSTEM - OPTIME
TOPICAL | Status: DC | PRN
  Administered 2022-07-06: 1

## 2022-07-06 MED ORDER — PROPOFOL 1000 MG/100ML IV EMUL
INTRAVENOUS | Status: AC
  Filled 2022-07-06: qty 100

## 2022-07-06 MED ORDER — PROPOFOL 500 MG/50ML IV EMUL
INTRAVENOUS | Status: DC | PRN
  Administered 2022-07-06: 60 ug/kg/min via INTRAVENOUS

## 2022-07-06 MED ORDER — OXYCODONE HCL 5 MG/5ML PO SOLN: 5.0000 mg | Freq: Once | ORAL | Status: DC | PRN

## 2022-07-06 MED ORDER — ACETAMINOPHEN 10 MG/ML IV SOLN
INTRAVENOUS | Status: DC | PRN
  Administered 2022-07-06: 1000 mg via INTRAVENOUS

## 2022-07-06 MED ORDER — MIDAZOLAM HCL 5 MG/5ML IJ SOLN
INTRAMUSCULAR | Status: DC | PRN
  Administered 2022-07-06: 2 mg via INTRAVENOUS

## 2022-07-06 MED ORDER — CHLORHEXIDINE GLUCONATE 4 % EX LIQD: 60.0000 mL | Freq: Once | CUTANEOUS | Status: DC

## 2022-07-06 MED ORDER — ADULT MULTIVITAMIN W/MINERALS CH
1.0000 | ORAL_TABLET | Freq: Every day | ORAL | Status: DC
  Administered 2022-07-07: 1 via ORAL
  Filled 2022-07-06: qty 1

## 2022-07-06 MED ORDER — ACETAMINOPHEN 325 MG PO TABS
325.0000 mg | ORAL_TABLET | Freq: Four times a day (QID) | ORAL | Status: DC | PRN
  Administered 2022-07-07: 650 mg via ORAL
  Filled 2022-07-06: qty 2

## 2022-07-06 MED ORDER — BUPIVACAINE LIPOSOME 1.3 % IJ SUSP
INTRAMUSCULAR | Status: AC
  Filled 2022-07-06: qty 20

## 2022-07-06 MED ORDER — ACETAMINOPHEN 10 MG/ML IV SOLN: 1000.0000 mg | Freq: Once | INTRAVENOUS | Status: DC | PRN

## 2022-07-06 MED ORDER — PROPOFOL 10 MG/ML IV BOLUS
INTRAVENOUS | Status: AC
  Filled 2022-07-06: qty 20

## 2022-07-06 MED ORDER — 0.9 % SODIUM CHLORIDE (POUR BTL) OPTIME
TOPICAL | Status: DC | PRN
  Administered 2022-07-06: 500 mL

## 2022-07-06 MED ORDER — LIDOCAINE HCL (PF) 2 % IJ SOLN
INTRAMUSCULAR | Status: AC
  Filled 2022-07-06: qty 5

## 2022-07-06 MED ORDER — CHLORHEXIDINE GLUCONATE 0.12 % MT SOLN
OROMUCOSAL | Status: AC
  Administered 2022-07-06: 15 mL via OROMUCOSAL
  Filled 2022-07-06: qty 15

## 2022-07-06 MED ORDER — CELECOXIB 200 MG PO CAPS
ORAL_CAPSULE | ORAL | Status: AC
  Filled 2022-07-06: qty 1

## 2022-07-06 MED ORDER — CELECOXIB 200 MG PO CAPS: 400.0000 mg | ORAL_CAPSULE | Freq: Once | ORAL | Status: AC

## 2022-07-06 MED ORDER — PANTOPRAZOLE SODIUM 40 MG PO TBEC
40.0000 mg | DELAYED_RELEASE_TABLET | Freq: Two times a day (BID) | ORAL | Status: DC
  Administered 2022-07-06 – 2022-07-07 (×3): 40 mg via ORAL
  Filled 2022-07-06 (×3): qty 1

## 2022-07-06 MED ORDER — SODIUM CHLORIDE (PF) 0.9 % IJ SOLN
INTRAMUSCULAR | Status: DC | PRN
  Administered 2022-07-06: 120 mL via INTRAMUSCULAR

## 2022-07-06 MED ORDER — FENTANYL CITRATE (PF) 100 MCG/2ML IJ SOLN: 25.0000 ug | INTRAMUSCULAR | Status: DC | PRN

## 2022-07-06 MED ORDER — CHLORHEXIDINE GLUCONATE 0.12 % MT SOLN: 15.0000 mL | Freq: Once | OROMUCOSAL | Status: AC

## 2022-07-06 MED ORDER — TRAMADOL HCL 50 MG PO TABS: 50.0000 mg | ORAL_TABLET | ORAL | Status: DC | PRN

## 2022-07-06 MED ORDER — BUPIVACAINE HCL (PF) 0.25 % IJ SOLN
INTRAMUSCULAR | Status: AC
  Filled 2022-07-06: qty 60

## 2022-07-06 MED ORDER — BUSPIRONE HCL 10 MG PO TABS: 5.0000 mg | ORAL_TABLET | Freq: Two times a day (BID) | ORAL | Status: DC | PRN

## 2022-07-06 MED ORDER — LEVOTHYROXINE SODIUM 50 MCG PO TABS
75.0000 ug | ORAL_TABLET | Freq: Every day | ORAL | Status: DC
  Administered 2022-07-07: 75 ug via ORAL
  Filled 2022-07-06: qty 1

## 2022-07-06 MED ORDER — ONDANSETRON HCL 4 MG PO TABS: 4.0000 mg | ORAL_TABLET | Freq: Four times a day (QID) | ORAL | Status: DC | PRN

## 2022-07-06 MED ORDER — SODIUM CHLORIDE FLUSH 0.9 % IV SOLN
INTRAVENOUS | Status: AC
  Filled 2022-07-06: qty 40

## 2022-07-06 MED ORDER — MAGNESIUM HYDROXIDE 400 MG/5ML PO SUSP
30.0000 mL | Freq: Every day | ORAL | Status: DC
  Administered 2022-07-06 – 2022-07-07 (×2): 30 mL via ORAL
  Filled 2022-07-06 (×2): qty 30

## 2022-07-06 MED ORDER — BISACODYL 10 MG RE SUPP: 10.0000 mg | Freq: Every day | RECTAL | Status: DC | PRN

## 2022-07-06 MED ORDER — OMEGA-3-ACID ETHYL ESTERS 1 G PO CAPS
2.0000 g | ORAL_CAPSULE | Freq: Every day | ORAL | Status: DC
  Administered 2022-07-07: 2 g via ORAL
  Filled 2022-07-06: qty 2

## 2022-07-06 MED ORDER — MENTHOL 3 MG MT LOZG: 1.0000 | LOZENGE | OROMUCOSAL | Status: DC | PRN

## 2022-07-06 MED ORDER — DIPHENHYDRAMINE HCL 12.5 MG/5ML PO ELIX
12.5000 mg | ORAL_SOLUTION | ORAL | Status: DC | PRN
  Administered 2022-07-06: 12.5 mg via ORAL
  Filled 2022-07-06: qty 10

## 2022-07-06 MED ORDER — DEXAMETHASONE SODIUM PHOSPHATE 10 MG/ML IJ SOLN
INTRAMUSCULAR | Status: AC
  Administered 2022-07-06: 8 mg via INTRAVENOUS
  Filled 2022-07-06: qty 1

## 2022-07-06 MED ORDER — GABAPENTIN 300 MG PO CAPS: 300.0000 mg | ORAL_CAPSULE | Freq: Once | ORAL | Status: AC

## 2022-07-06 MED ORDER — INULIN 1.5 G PO CHEW: 1.0000 | CHEWABLE_TABLET | Freq: Every day | ORAL | Status: DC

## 2022-07-06 MED ORDER — GABAPENTIN 300 MG PO CAPS
ORAL_CAPSULE | ORAL | Status: AC
  Administered 2022-07-06: 300 mg via ORAL
  Filled 2022-07-06: qty 1

## 2022-07-06 MED ORDER — DEXAMETHASONE SODIUM PHOSPHATE 10 MG/ML IJ SOLN: 8.0000 mg | Freq: Once | INTRAMUSCULAR | Status: AC

## 2022-07-06 MED ORDER — CELECOXIB 200 MG PO CAPS
ORAL_CAPSULE | ORAL | Status: AC
  Administered 2022-07-06: 400 mg via ORAL
  Filled 2022-07-06: qty 1

## 2022-07-06 MED ORDER — SODIUM CHLORIDE 0.9 % IV SOLN: INTRAVENOUS | Status: DC

## 2022-07-06 MED ORDER — MIDAZOLAM HCL 2 MG/2ML IJ SOLN
INTRAMUSCULAR | Status: AC
  Filled 2022-07-06: qty 2

## 2022-07-06 MED ORDER — LOPERAMIDE HCL 2 MG PO CAPS: 2.0000 mg | ORAL_CAPSULE | Freq: Four times a day (QID) | ORAL | Status: DC | PRN

## 2022-07-06 MED ORDER — ONDANSETRON HCL 4 MG/2ML IJ SOLN: 4.0000 mg | Freq: Once | INTRAMUSCULAR | Status: DC | PRN

## 2022-07-06 MED ORDER — FAMOTIDINE 20 MG PO TABS: 20.0000 mg | ORAL_TABLET | Freq: Once | ORAL | Status: AC

## 2022-07-06 MED ORDER — PROPOFOL 10 MG/ML IV BOLUS
INTRAVENOUS | Status: DC | PRN
  Administered 2022-07-06: 40 mg via INTRAVENOUS

## 2022-07-06 MED ORDER — ACETAMINOPHEN 10 MG/ML IV SOLN
1000.0000 mg | Freq: Four times a day (QID) | INTRAVENOUS | Status: DC
  Administered 2022-07-06 – 2022-07-07 (×3): 1000 mg via INTRAVENOUS
  Filled 2022-07-06 (×4): qty 100

## 2022-07-06 MED ORDER — ENOXAPARIN SODIUM 30 MG/0.3ML IJ SOSY
30.0000 mg | PREFILLED_SYRINGE | Freq: Two times a day (BID) | INTRAMUSCULAR | Status: DC
  Administered 2022-07-07: 30 mg via SUBCUTANEOUS
  Filled 2022-07-06: qty 0.3

## 2022-07-06 MED ORDER — OXYCODONE HCL 5 MG PO TABS
10.0000 mg | ORAL_TABLET | ORAL | Status: DC | PRN
  Administered 2022-07-06: 10 mg via ORAL
  Filled 2022-07-06: qty 2

## 2022-07-06 MED ORDER — TRANEXAMIC ACID-NACL 1000-0.7 MG/100ML-% IV SOLN
INTRAVENOUS | Status: AC
  Filled 2022-07-06: qty 100

## 2022-07-06 MED ORDER — CITALOPRAM HYDROBROMIDE 10 MG PO TABS
30.0000 mg | ORAL_TABLET | Freq: Every day | ORAL | Status: DC
  Administered 2022-07-07: 30 mg via ORAL
  Filled 2022-07-06 (×2): qty 3

## 2022-07-06 MED ORDER — PHENYLEPHRINE HCL-NACL 20-0.9 MG/250ML-% IV SOLN
INTRAVENOUS | Status: DC | PRN
  Administered 2022-07-06: 20 ug/min via INTRAVENOUS

## 2022-07-06 MED ORDER — ENSURE PRE-SURGERY PO LIQD
296.0000 mL | Freq: Once | ORAL | Status: AC
  Administered 2022-07-06: 296 mL via ORAL
  Filled 2022-07-06: qty 296

## 2022-07-06 MED ORDER — DEXAMETHASONE SODIUM PHOSPHATE 10 MG/ML IJ SOLN
INTRAMUSCULAR | Status: AC
  Filled 2022-07-06: qty 1

## 2022-07-06 MED ORDER — ONDANSETRON HCL 4 MG/2ML IJ SOLN
INTRAMUSCULAR | Status: DC | PRN
  Administered 2022-07-06: 4 mg via INTRAVENOUS

## 2022-07-06 MED ORDER — METOCLOPRAMIDE HCL 5 MG PO TABS
10.0000 mg | ORAL_TABLET | Freq: Three times a day (TID) | ORAL | Status: DC
  Administered 2022-07-06 – 2022-07-07 (×4): 10 mg via ORAL
  Filled 2022-07-06 (×4): qty 2

## 2022-07-06 MED ORDER — LORATADINE 10 MG PO TABS
10.0000 mg | ORAL_TABLET | Freq: Every day | ORAL | Status: DC
  Administered 2022-07-07: 10 mg via ORAL
  Filled 2022-07-06: qty 1

## 2022-07-06 MED ORDER — FAMOTIDINE 20 MG PO TABS
ORAL_TABLET | ORAL | Status: AC
  Administered 2022-07-06: 20 mg via ORAL
  Filled 2022-07-06: qty 1

## 2022-07-06 MED ORDER — DEXAMETHASONE SODIUM PHOSPHATE 10 MG/ML IJ SOLN
INTRAMUSCULAR | Status: DC | PRN
  Administered 2022-07-06: 10 mg via INTRAVENOUS

## 2022-07-06 MED ORDER — ORAL CARE MOUTH RINSE: 15.0000 mL | Freq: Once | OROMUCOSAL | Status: AC

## 2022-07-06 MED ORDER — CEFAZOLIN SODIUM-DEXTROSE 2-4 GM/100ML-% IV SOLN
2.0000 g | INTRAVENOUS | Status: AC
  Administered 2022-07-06: 2 g via INTRAVENOUS

## 2022-07-06 MED ORDER — CEFAZOLIN SODIUM-DEXTROSE 2-4 GM/100ML-% IV SOLN
INTRAVENOUS | Status: AC
  Filled 2022-07-06: qty 100

## 2022-07-06 MED ORDER — ONDANSETRON HCL 4 MG/2ML IJ SOLN
INTRAMUSCULAR | Status: AC
  Filled 2022-07-06: qty 2

## 2022-07-06 MED ORDER — SENNOSIDES-DOCUSATE SODIUM 8.6-50 MG PO TABS
1.0000 | ORAL_TABLET | Freq: Two times a day (BID) | ORAL | Status: DC
  Administered 2022-07-06 – 2022-07-07 (×3): 1 via ORAL
  Filled 2022-07-06 (×3): qty 1

## 2022-07-06 MED ORDER — ROSUVASTATIN CALCIUM 10 MG PO TABS
20.0000 mg | ORAL_TABLET | Freq: Every day | ORAL | Status: DC
  Administered 2022-07-06 – 2022-07-07 (×2): 20 mg via ORAL
  Filled 2022-07-06 (×2): qty 2

## 2022-07-06 MED ORDER — OXYCODONE HCL 5 MG PO TABS: 5.0000 mg | ORAL_TABLET | ORAL | Status: DC | PRN

## 2022-07-06 MED ORDER — ACETAMINOPHEN 10 MG/ML IV SOLN
INTRAVENOUS | Status: AC
  Filled 2022-07-06: qty 100

## 2022-07-06 MED ORDER — ALUM & MAG HYDROXIDE-SIMETH 200-200-20 MG/5ML PO SUSP: 30.0000 mL | ORAL | Status: DC | PRN

## 2022-07-06 MED ORDER — VITAMIN D 25 MCG (1000 UNIT) PO TABS
1000.0000 ug | ORAL_TABLET | Freq: Every day | ORAL | Status: DC
  Administered 2022-07-07: 1000 ug via ORAL
  Filled 2022-07-06: qty 1

## 2022-07-06 MED ORDER — OXYCODONE HCL 5 MG PO TABS: 5.0000 mg | ORAL_TABLET | Freq: Once | ORAL | Status: DC | PRN

## 2022-07-06 MED ORDER — TRANEXAMIC ACID-NACL 1000-0.7 MG/100ML-% IV SOLN
1000.0000 mg | INTRAVENOUS | Status: AC
  Administered 2022-07-06: 1000 mg via INTRAVENOUS

## 2022-07-06 MED ORDER — HYDROMORPHONE HCL 1 MG/ML IJ SOLN: 0.5000 mg | INTRAMUSCULAR | Status: DC | PRN

## 2022-07-06 MED ORDER — PHENYLEPHRINE HCL-NACL 20-0.9 MG/250ML-% IV SOLN
INTRAVENOUS | Status: AC
  Filled 2022-07-06: qty 250

## 2022-07-06 MED ORDER — BUPIVACAINE HCL (PF) 0.5 % IJ SOLN
INTRAMUSCULAR | Status: DC | PRN
  Administered 2022-07-06: 3 mL

## 2022-07-06 MED ORDER — CEFAZOLIN SODIUM-DEXTROSE 2-4 GM/100ML-% IV SOLN
2.0000 g | Freq: Four times a day (QID) | INTRAVENOUS | Status: AC
  Administered 2022-07-06 (×2): 2 g via INTRAVENOUS
  Filled 2022-07-06 (×2): qty 100

## 2022-07-06 SURGICAL SUPPLY — 76 items
ATTUNE MED DOME PAT 38 KNEE (Knees) IMPLANT
ATTUNE PS FEM RT SZ 4 CEM KNEE (Femur) IMPLANT
ATTUNE PSRP INSR SZ4 12 KNEE (Insert) IMPLANT
BASEPLATE TIBIAL ROTATING SZ 4 (Knees) IMPLANT
BATTERY INSTRU NAVIGATION (MISCELLANEOUS) ×4 IMPLANT
BLADE SAW 70X12.5 (BLADE) ×1 IMPLANT
BLADE SAW 90X13X1.19 OSCILLAT (BLADE) ×1 IMPLANT
BLADE SAW 90X25X1.19 OSCILLAT (BLADE) ×1 IMPLANT
BONE CEMENT GENTAMICIN (Cement) ×2 IMPLANT
CEMENT BONE GENTAMICIN 40 (Cement) IMPLANT
COOLER POLAR GLACIER W/PUMP (MISCELLANEOUS) ×1 IMPLANT
CUFF TOURN SGL QUICK 24 (TOURNIQUET CUFF)
CUFF TOURN SGL QUICK 34 (TOURNIQUET CUFF)
CUFF TRNQT CYL 24X4X16.5-23 (TOURNIQUET CUFF) IMPLANT
CUFF TRNQT CYL 34X4.125X (TOURNIQUET CUFF) IMPLANT
DRAPE 3/4 80X56 (DRAPES) ×1 IMPLANT
DRAPE INCISE IOBAN 66X45 STRL (DRAPES) IMPLANT
DRSG DERMACEA NONADH 3X8 (GAUZE/BANDAGES/DRESSINGS) ×1 IMPLANT
DRSG MEPILEX SACRM 8.7X9.8 (GAUZE/BANDAGES/DRESSINGS) ×1 IMPLANT
DRSG OPSITE POSTOP 4X14 (GAUZE/BANDAGES/DRESSINGS) ×1 IMPLANT
DRSG TEGADERM 4X4.75 (GAUZE/BANDAGES/DRESSINGS) ×1 IMPLANT
DURAPREP 26ML APPLICATOR (WOUND CARE) ×2 IMPLANT
ELECT CAUTERY BLADE 6.4 (BLADE) ×1 IMPLANT
ELECT REM PT RETURN 9FT ADLT (ELECTROSURGICAL) ×1
ELECTRODE REM PT RTRN 9FT ADLT (ELECTROSURGICAL) ×1 IMPLANT
EX-PIN ORTHOLOCK NAV 4X150 (PIN) ×2 IMPLANT
GLOVE BIO SURGEON STRL SZ7 (GLOVE) ×2 IMPLANT
GLOVE BIOGEL M STRL SZ7.5 (GLOVE) ×2 IMPLANT
GLOVE BIOGEL PI IND STRL 8 (GLOVE) ×1 IMPLANT
GLOVE PI ORTHO PRO STRL 7.5 (GLOVE) ×2 IMPLANT
GLOVE SURG UNDER POLY LF SZ7.5 (GLOVE) ×1 IMPLANT
GOWN STRL REUS W/ TWL LRG LVL3 (GOWN DISPOSABLE) ×2 IMPLANT
GOWN STRL REUS W/ TWL XL LVL3 (GOWN DISPOSABLE) ×1 IMPLANT
GOWN STRL REUS W/TWL LRG LVL3 (GOWN DISPOSABLE) ×2
GOWN STRL REUS W/TWL XL LVL3 (GOWN DISPOSABLE) ×1
HEMOVAC 400CC 10FR (MISCELLANEOUS) ×1 IMPLANT
HOLDER FOLEY CATH W/STRAP (MISCELLANEOUS) ×1 IMPLANT
HOLSTER ELECTROSUGICAL PENCIL (MISCELLANEOUS) ×1 IMPLANT
HOOD PEEL AWAY FLYTE STAYCOOL (MISCELLANEOUS) ×2 IMPLANT
IV NS IRRIG 3000ML ARTHROMATIC (IV SOLUTION) ×1 IMPLANT
KIT TURNOVER KIT A (KITS) ×1 IMPLANT
KNIFE SCULPS 14X20 (INSTRUMENTS) ×1 IMPLANT
MANIFOLD NEPTUNE II (INSTRUMENTS) ×2 IMPLANT
NDL SPNL 20GX3.5 QUINCKE YW (NEEDLE) ×2 IMPLANT
NEEDLE SPNL 20GX3.5 QUINCKE YW (NEEDLE) ×2 IMPLANT
NS IRRIG 500ML POUR BTL (IV SOLUTION) ×1 IMPLANT
PACK TOTAL KNEE (MISCELLANEOUS) ×1 IMPLANT
PAD ABD DERMACEA PRESS 5X9 (GAUZE/BANDAGES/DRESSINGS) ×2 IMPLANT
PAD WRAPON POLAR KNEE (MISCELLANEOUS) ×1 IMPLANT
PAD WRAPON POLOR MULTI XL (MISCELLANEOUS) IMPLANT
PIN DRILL FIX HALF THREAD (BIT) ×2 IMPLANT
PIN FIXATION 1/8DIA X 3INL (PIN) ×1 IMPLANT
PULSAVAC PLUS IRRIG FAN TIP (DISPOSABLE) ×1
SOL PREP PVP 2OZ (MISCELLANEOUS) ×1
SOLUTION IRRIG SURGIPHOR (IV SOLUTION) ×1 IMPLANT
SOLUTION PREP PVP 2OZ (MISCELLANEOUS) ×1 IMPLANT
SPONGE DRAIN TRACH 4X4 STRL 2S (GAUZE/BANDAGES/DRESSINGS) ×1 IMPLANT
STAPLER SKIN PROX 35W (STAPLE) ×1 IMPLANT
STOCKINETTE IMPERV 14X48 (MISCELLANEOUS) IMPLANT
STRAP TIBIA SHORT (MISCELLANEOUS) ×1 IMPLANT
SUCTION FRAZIER HANDLE 10FR (MISCELLANEOUS) ×1
SUCTION TUBE FRAZIER 10FR DISP (MISCELLANEOUS) ×1 IMPLANT
SUT VIC AB 0 CT1 36 (SUTURE) ×2 IMPLANT
SUT VIC AB 1 CT1 36 (SUTURE) ×2 IMPLANT
SUT VIC AB 2-0 CT2 27 (SUTURE) ×1 IMPLANT
SYR 30ML LL (SYRINGE) ×2 IMPLANT
TIP FAN IRRIG PULSAVAC PLUS (DISPOSABLE) ×1 IMPLANT
TOWEL OR 17X26 4PK STRL BLUE (TOWEL DISPOSABLE) IMPLANT
TOWER CARTRIDGE SMART MIX (DISPOSABLE) ×1 IMPLANT
TRAP FLUID SMOKE EVACUATOR (MISCELLANEOUS) ×1 IMPLANT
TRAY FOLEY MTR SLVR 16FR STAT (SET/KITS/TRAYS/PACK) ×1 IMPLANT
WATER STERILE IRR 1000ML POUR (IV SOLUTION) IMPLANT
WATER STERILE IRR 500ML POUR (IV SOLUTION) ×1 IMPLANT
WRAP-ON POLOR PAD MULTI XL (MISCELLANEOUS) ×1
WRAPON POLAR PAD KNEE (MISCELLANEOUS)
WRAPON POLOR PAD MULTI XL (MISCELLANEOUS) ×1

## 2022-07-06 NOTE — Op Note (Signed)
OPERATIVE NOTE  DATE OF SURGERY:  07/06/2022  PATIENT NAME:  Kristen Aguilar   DOB: [birth year]  MRN: [id number]  PRE-OPERATIVE DIAGNOSIS: Degenerative arthrosis of the right knee, primary  POST-OPERATIVE DIAGNOSIS:  Same  PROCEDURE:  Right total knee arthroplasty using computer-assisted navigation  SURGEON:  Marciano Sequin. M.D.  ANESTHESIA: spinal  ESTIMATED BLOOD LOSS: 50 mL  FLUIDS REPLACED: 1000 mL of crystalloid  TOURNIQUET TIME: 96 minutes  DRAINS: 2 medium Hemovac drains  SOFT TISSUE RELEASES: Anterior cruciate ligament, posterior cruciate ligament, deep medial collateral ligament, patellofemoral ligament  IMPLANTS UTILIZED: DePuy Attune size 4 posterior stabilized femoral component (cemented), size 4 rotating platform tibial component (cemented), 38 mm medialized dome patella (cemented), and a 12 mm stabilized rotating platform polyethylene insert.  INDICATIONS FOR SURGERY: Kristen Aguilar is a 56 y.o. year old female with a long history of progressive knee pain. X-rays demonstrated severe degenerative changes in tricompartmental fashion. The patient had not seen any significant improvement despite conservative nonsurgical intervention. After discussion of the risks and benefits of surgical intervention, the patient expressed understanding of the risks benefits and agree with plans for total knee arthroplasty.   The risks, benefits, and alternatives were discussed at length including but not limited to the risks of infection, bleeding, nerve injury, stiffness, blood clots, the need for revision surgery, cardiopulmonary complications, among others, and they were willing to proceed.  PROCEDURE IN DETAIL: The patient was brought into the operating room and, after adequate spinal anesthesia was achieved, a tourniquet was placed on the patient's upper thigh. The patient's knee and leg were cleaned and prepped with alcohol and DuraPrep and draped in the usual sterile fashion.  A "timeout" was performed as per usual protocol. The lower extremity was exsanguinated using an Esmarch, and the tourniquet was inflated to 300 mmHg. An anterior longitudinal incision was made followed by a standard mid vastus approach. The deep fibers of the medial collateral ligament were elevated in a subperiosteal fashion off of the medial flare of the tibia so as to maintain a continuous soft tissue sleeve. The patella was subluxed laterally and the patellofemoral ligament was incised. Inspection of the knee demonstrated severe degenerative changes with full-thickness loss of articular cartilage. Osteophytes were debrided using a rongeur. Anterior and posterior cruciate ligaments were excised. Two 4.0 mm Schanz pins were inserted in the femur and into the tibia for attachment of the array of trackers used for computer-assisted navigation. Hip center was identified using a circumduction technique. Distal landmarks were mapped using the computer. The distal femur and proximal tibia were mapped using the computer. The distal femoral cutting guide was positioned using computer-assisted navigation so as to achieve a 5 distal valgus cut. The femur was sized and it was felt that a size 4 femoral component was appropriate. A size 4 femoral cutting guide was positioned and the anterior cut was performed and verified using the computer. This was followed by completion of the posterior and chamfer cuts. Femoral cutting guide for the central box was then positioned in the center box cut was performed.  Attention was then directed to the proximal tibia. Medial and lateral menisci were excised. The extramedullary tibial cutting guide was positioned using computer-assisted navigation so as to achieve a 0 varus-valgus alignment and 3 posterior slope. The cut was performed and verified using the computer. The proximal tibia was sized and it was felt that a size 4 tibial tray was appropriate. Tibial and femoral trials were  inserted followed  by insertion of a 12 mm polyethylene insert. This allowed for excellent mediolateral soft tissue balancing both in flexion and in full extension. Finally, the patella was cut and prepared so as to accommodate a 38 mm medialized dome patella. A patella trial was placed and the knee was placed through a range of motion with excellent patellar tracking appreciated. The femoral trial was removed after debridement of posterior osteophytes. The central post-hole for the tibial component was reamed followed by insertion of a keel punch. Tibial trials were then removed. Cut surfaces of bone were irrigated with copious amounts of normal saline using pulsatile lavage and then suctioned dry. Polymethylmethacrylate cement with gentamicin was prepared in the usual fashion using a vacuum mixer. Cement was applied to the cut surface of the proximal tibia as well as along the undersurface of a size 4 rotating platform tibial component. Tibial component was positioned and impacted into place. Excess cement was removed using Personal assistant. Cement was then applied to the cut surfaces of the femur as well as along the posterior flanges of the size 4 femoral component. The femoral component was positioned and impacted into place. Excess cement was removed using Personal assistant. A 12 mm polyethylene trial was inserted and the knee was brought into full extension with steady axial compression applied. Finally, cement was applied to the backside of a 38 mm medialized dome patella and the patellar component was positioned and patellar clamp applied. Excess cement was removed using Personal assistant. After adequate curing of the cement, the tourniquet was deflated after a total tourniquet time of 96 minutes. Hemostasis was achieved using electrocautery. The knee was irrigated with copious amounts of normal saline using pulsatile lavage followed by 450 ml of Surgiphor and then suctioned dry. 20 mL of 1.3% Exparel and 60 mL  of 0.25% Marcaine in 40 mL of normal saline was injected along the posterior capsule, medial and lateral gutters, and along the arthrotomy site. A 12 mm stabilized rotating platform polyethylene insert was inserted and the knee was placed through a range of motion with excellent mediolateral soft tissue balancing appreciated and excellent patellar tracking noted. 2 medium drains were placed in the wound bed and brought out through separate stab incisions. The medial parapatellar portion of the incision was reapproximated using interrupted sutures of #1 Vicryl. Subcutaneous tissue was approximated in layers using first #0 Vicryl followed #2-0 Vicryl. The skin was approximated with skin staples. A sterile dressing was applied.  The patient tolerated the procedure well and was transported to the recovery room in stable condition.    Latese Dufault P. Angie Fava., M.D.

## 2022-07-06 NOTE — Interval H&P Note (Signed)
History and Physical Interval Note:  07/06/2022 6:14 AM  Kristen Aguilar  has presented today for surgery, with the diagnosis of PRIMARY OSTEOARTHRITIS OF RIGHT KNEE..  The various methods of treatment have been discussed with the patient and family. After consideration of risks, benefits and other options for treatment, the patient has consented to  Procedure(s): COMPUTER ASSISTED TOTAL KNEE ARTHROPLASTY - RNFA (Right) as a surgical intervention.  The patient's history has been reviewed, patient examined, no change in status, stable for surgery.  I have reviewed the patient's chart and labs.  Questions were answered to the patient's satisfaction.     [place]

## 2022-07-06 NOTE — Evaluation (Signed)
Physical Therapy Evaluation Patient Details Name: Kristen Aguilar MRN: [id number] DOB: 03-03-1966 Today's Date: 07/06/2022  History of Present Illness  Pt is a 56 y/o F admitted on 07/06/22 for scheduled R TKA. PMH: HTN, HLD  Clinical Impression  Pt seen for PT evaluation with pt agreeable to tx. Pt reports prior to admission she was independent without AD, only using SPC PRN 2/2 R knee pain. On this date, PT educates pt on use of polar care, need to promote R knee extension, & rehab in acute setting. Provided pt with HEP & pt performs RLE strengthening exercises with AAROM PRN. Pt is able to complete supine>sit with supervision, STS & stand pivot with min assist with cuing for safe use of AD. During transfer, pt abruptly sits in recliner with PT assisting with controlling descent. Pt notes her knee just couldn't support her anymore. Will continue to follow pt acutely to address RLE strengthening, gait & stairs with LRAD.        Recommendations for follow up therapy are one component of a multi-disciplinary discharge planning process, led by the attending physician.  Recommendations may be updated based on patient status, additional functional criteria and insurance authorization.  Follow Up Recommendations Home health PT      Assistance Recommended at Discharge Intermittent Supervision/Assistance  Patient can return home with the following  A little help with bathing/dressing/bathroom;A little help with walking and/or transfers;Assistance with cooking/housework;Help with stairs or ramp for entrance;Assist for transportation    Equipment Recommendations None recommended by PT  Recommendations for Other Services       Functional Status Assessment Patient has had a recent decline in their functional status and demonstrates the ability to make significant improvements in function in a reasonable and predictable amount of time.     Precautions / Restrictions Precautions Precautions:  Fall Restrictions Weight Bearing Restrictions: Yes RLE Weight Bearing: Weight bearing as tolerated      Mobility  Bed Mobility Overal bed mobility: Needs Assistance Bed Mobility: Supine to Sit     Supine to sit: Supervision, HOB elevated     General bed mobility comments: extra time, use of bed rails    Transfers Overall transfer level: Needs assistance Equipment used: Rolling walker (2 wheels) Transfers: Sit to/from Stand, Bed to chair/wheelchair/BSC Sit to Stand: Min assist   Step pivot transfers: Min assist            Ambulation/Gait                  Stairs            Wheelchair Mobility    Modified Rankin (Stroke Patients Only)       Balance Overall balance assessment: Needs assistance Sitting-balance support: Feet supported Sitting balance-Leahy Scale: Good     Standing balance support: Reliant on assistive device for balance, During functional activity, Bilateral upper extremity supported Standing balance-Leahy Scale: Fair                               Pertinent Vitals/Pain Pain Assessment Pain Assessment: Faces Faces Pain Scale: Hurts a little bit Pain Location: R knee with flexion Pain Descriptors / Indicators: Discomfort, Grimacing Pain Intervention(s): Monitored during session (polar care donned at beginning & end of session)    Home Living Family/patient expects to be discharged to:: Private residence Living Arrangements: Spouse/significant other;Children Available Help at Discharge: Family Type of Home: House Home Access: Stairs to enter  Entrance Stairs-Rails: None Entrance Stairs-Number of Steps: 4   Home Layout: One level Home Equipment: Conservation officer, nature (2 wheels);Cane - single point      Prior Function Prior Level of Function : Independent/Modified Independent;Driving             Mobility Comments: Pt reports she used SPC PRN 2/2 R knee pain. Reports 1 fall back in the spring. ADLs Comments:  Independent with cooking & cleaning.     Hand Dominance        Extremity/Trunk Assessment   Upper Extremity Assessment Upper Extremity Assessment: Overall WFL for tasks assessed    Lower Extremity Assessment Lower Extremity Assessment: Generalized weakness (3-/5 knee extension, decreased sensation in RLE, pt reports being able to feel pressure & correctly identifies which toe PT is touching)       Communication   Communication: No difficulties  Cognition Arousal/Alertness: Awake/alert Behavior During Therapy: WFL for tasks assessed/performed Overall Cognitive Status: Within Functional Limits for tasks assessed                                          General Comments      Exercises Total Joint Exercises Ankle Circles/Pumps: AROM, Both, 10 reps, Supine Quad Sets: AROM, Strengthening, Right, 10 reps, Supine Short Arc Quad: AAROM, Strengthening, Right, 10 reps, Supine Heel Slides: AAROM, Supine, Strengthening, Right, 10 reps Hip ABduction/ADduction: AAROM, Supine, Strengthening, Right, AROM, 10 reps Straight Leg Raises: AAROM, AROM, Strengthening, Right, 10 reps, Supine (1 rep without assistance) Long Arc Quad: AROM, Strengthening, Right, 10 reps, Seated Goniometric ROM: 10 degrees from full extension, 70 degrees flexion   Assessment/Plan    PT Assessment Patient needs continued PT services  PT Problem List Decreased strength;Decreased coordination;Pain;Decreased range of motion;Decreased activity tolerance;Decreased balance;Decreased mobility;Decreased knowledge of precautions;Decreased safety awareness;Decreased knowledge of use of DME;Impaired sensation       PT Treatment Interventions DME instruction;Therapeutic exercise;Gait training;Balance training;Neuromuscular re-education;Stair training;Functional mobility training;Therapeutic activities;Patient/family education;Modalities;Manual techniques    PT Goals (Current goals can be found in the Care  Plan section)  Acute Rehab PT Goals Patient Stated Goal: return to PLOF PT Goal Formulation: With patient Time For Goal Achievement: 07/27/22 Potential to Achieve Goals: Good    Frequency BID     Co-evaluation               AM-PAC PT "6 Clicks" Mobility  Outcome Measure Help needed turning from your back to your side while in a flat bed without using bedrails?: None Help needed moving from lying on your back to sitting on the side of a flat bed without using bedrails?: A Little Help needed moving to and from a bed to a chair (including a wheelchair)?: A Little Help needed standing up from a chair using your arms (e.g., wheelchair or bedside chair)?: A Little Help needed to walk in hospital room?: A Lot Help needed climbing 3-5 steps with a railing? : Total 6 Click Score: 16    End of Session Equipment Utilized During Treatment: Gait belt Activity Tolerance: Patient tolerated treatment well Patient left: in chair;with chair alarm set;with call bell/phone within reach;with family/visitor present Nurse Communication: Mobility status PT Visit Diagnosis: Unsteadiness on feet (R26.81);Muscle weakness (generalized) (M62.81);Difficulty in walking, not elsewhere classified (R26.2);Other abnormalities of gait and mobility (R26.89)    Time: 1438-1500 PT Time Calculation (min) (ACUTE ONLY): 22 min   Charges:   PT  Evaluation $PT Eval Low Complexity: 1 Low PT Treatments $Therapeutic Exercise: 8-22 mins        Aleda Grana, PT, DPT 07/06/22, 3:30 PM   Sandi Mariscal 07/06/2022, 3:28 PM

## 2022-07-06 NOTE — Anesthesia Preprocedure Evaluation (Signed)
Anesthesia Evaluation  Patient identified by MRN, date of birth, ID band Patient awake    Reviewed: Allergy & Precautions, NPO status , Patient's Chart, lab work & pertinent test results  History of Anesthesia Complications Negative for: history of anesthetic complications  Airway Mallampati: III  TM Distance: >3 FB Neck ROM: Full    Dental no notable dental hx. (+) Teeth Intact   Pulmonary neg pulmonary ROS, neg sleep apnea, neg COPD, Patient abstained from smoking.Not current smoker,    Pulmonary exam normal breath sounds clear to auscultation       Cardiovascular Exercise Tolerance: Good METShypertension, Pt. on medications (-) CAD, (-) Past MI and (-) CHF (-) dysrhythmias (-) Valvular Problems/Murmurs Rhythm:Regular Rate:Normal - Systolic murmurs    Neuro/Psych neg Seizures PSYCHIATRIC DISORDERS Anxiety Depression negative neurological ROS     GI/Hepatic Neg liver ROS, neg GERD  ,  Endo/Other  diabetesHypothyroidism Morbid obesity  Renal/GU negative Renal ROS     Musculoskeletal   Abdominal (+) + obese,   Peds  Hematology  (+) Blood dyscrasia, anemia ,   Anesthesia Other Findings Past Medical History: No date: Allergy No date: Anemia     Comment:  as a child No date: Anxiety No date: Arthritis No date: Depression No date: Gestational diabetes 04/25/2014: Hyperlipidemia due to dietary fat intake No date: Hypertension No date: Hypothyroidism No date: Pneumonia     Comment:  as a child No date: PONV (postoperative nausea and vomiting)     Comment:  with c sections No date: Pre-diabetes  Reproductive/Obstetrics                             Anesthesia Physical  Anesthesia Plan  ASA: II  Anesthesia Plan: General   Post-op Pain Management: Ofirmev IV (intra-op)*, Celebrex PO (pre-op)* and Gabapentin PO (pre-op)*   Induction: Intravenous  PONV Risk Score and Plan: 3 and  Midazolam, TIVA, Propofol infusion, Ondansetron and Dexamethasone  Airway Management Planned: Nasal Cannula and Natural Airway  Additional Equipment: None  Intra-op Plan:   Post-operative Plan:   Informed Consent: I have reviewed the patients History and Physical, chart, labs and discussed the procedure including the risks, benefits and alternatives for the proposed anesthesia with the patient or authorized representative who has indicated his/her understanding and acceptance.       Plan Discussed with: CRNA and Surgeon  Anesthesia Plan Comments: (Discussed R/B/A of neuraxial anesthesia technique with patient: - rare risks of spinal/epidural hematoma, nerve damage, infection - Risk of PDPH - Risk of nausea and vomiting - Risk of conversion to general anesthesia and its associated risks, including sore throat, damage to lips/eyes/teeth/oropharynx, and rare risks such as cardiac and respiratory events. - Risk of allergic reactions  Discussed the role of CRNA in patient's perioperative care.  Patient voiced understanding.)        Anesthesia Quick Evaluation

## 2022-07-06 NOTE — Transfer of Care (Signed)
Immediate Anesthesia Transfer of Care Note  Patient: Kristen Aguilar  Procedure(s) Performed: COMPUTER ASSISTED TOTAL KNEE ARTHROPLASTY (Right: Knee)  Patient Location: PACU  Anesthesia Type:General and Spinal  Level of Consciousness: awake, alert  and oriented  Airway & Oxygen Therapy: Patient Spontanous Breathing and Patient connected to face mask oxygen  Post-op Assessment: Report given to RN, Post -op Vital signs reviewed and stable and Patient moving all extremities  Post vital signs: Reviewed and stable  Last Vitals:  Vitals Value Taken Time  BP 116/75 07/06/22 1105  Temp    Pulse 90 07/06/22 1108  Resp 20 07/06/22 1108  SpO2 94 % 07/06/22 1108  Vitals shown include unvalidated device data.  Last Pain:  Vitals:   07/06/22 0631  TempSrc: Oral  PainSc: 2          Complications: No notable events documented.

## 2022-07-07 DIAGNOSIS — M1711 Unilateral primary osteoarthritis, right knee: Secondary | ICD-10-CM | POA: Diagnosis not present

## 2022-07-07 MED ORDER — ENOXAPARIN SODIUM 40 MG/0.4ML IJ SOSY: 40.0000 mg | PREFILLED_SYRINGE | INTRAMUSCULAR | 0 refills | Status: DC

## 2022-07-07 MED ORDER — CELECOXIB 200 MG PO CAPS: 200.0000 mg | ORAL_CAPSULE | Freq: Two times a day (BID) | ORAL | 0 refills | Status: AC

## 2022-07-07 MED ORDER — TRAMADOL HCL 50 MG PO TABS: 50.0000 mg | ORAL_TABLET | ORAL | 0 refills | Status: DC | PRN

## 2022-07-07 MED ORDER — OXYCODONE HCL 5 MG PO TABS: 5.0000 mg | ORAL_TABLET | ORAL | 0 refills | Status: DC | PRN

## 2022-07-07 MED ORDER — SENNOSIDES-DOCUSATE SODIUM 8.6-50 MG PO TABS: 1.0000 | ORAL_TABLET | Freq: Two times a day (BID) | ORAL | 0 refills | Status: DC

## 2022-07-07 NOTE — Plan of Care (Signed)
  Problem: Education: Goal: Knowledge of the prescribed therapeutic regimen will improve Outcome: Progressing   Problem: Activity: Goal: Ability to avoid complications of mobility impairment will improve Outcome: Progressing   Problem: Pain Management: Goal: Pain level will decrease with appropriate interventions Outcome: Progressing   Problem: Education: Goal: Knowledge of General Education information will improve Description: Including pain rating scale, medication(s)/side effects and non-pharmacologic comfort measures Outcome: Progressing   Problem: Health Behavior/Discharge Planning: Goal: Ability to manage health-related needs will improve Outcome: Progressing   Problem: Clinical Measurements: Goal: Diagnostic test results will improve Outcome: Progressing Goal: Cardiovascular complication will be avoided Outcome: Progressing   Problem: Activity: Goal: Risk for activity intolerance will decrease Outcome: Progressing   Problem: Nutrition: Goal: Adequate nutrition will be maintained Outcome: Progressing   Problem: Elimination: Goal: Will not experience complications related to bowel motility Outcome: Progressing Goal: Will not experience complications related to urinary retention Outcome: Progressing   Problem: Pain Managment: Goal: General experience of comfort will improve Outcome: Progressing

## 2022-07-07 NOTE — Discharge Summary (Signed)
Physician Discharge Summary  Patient ID: Kristen Aguilar MRN: [id number] DOB/AGE: [birth year] 56 y.o.  Admit date: 07/06/2022 Discharge date: 07/07/2022  Admission Diagnoses:  Total knee replacement status [Z96.659]   Discharge Diagnoses: Patient Active Problem List   Diagnosis Date Noted   Morbid obesity (HCC) 07/06/2022   Total knee replacement status 07/06/2022   Preoperative cardiovascular examination 07/05/2022   Nonspecific abnormal electrocardiogram (ECG) (EKG) 06/30/2022   Hypokalemia 06/30/2022   Primary osteoarthritis of right knee 06/17/2022   Diarrhea 12/02/2021   Numbness 06/27/2021   Tingling 06/27/2021   Numbness of right foot 03/28/2021   Right knee pain 07/12/2020   Environmental allergies 02/24/2020   Essential hypertension 09/20/2018   Hyperglycemia 04/17/2017   Stress 03/11/2017   Health care maintenance 01/22/2015   Vaginal spotting 07/25/2014   Hyperlipidemia due to dietary fat intake 04/25/2014   Hypercholesterolemia 04/25/2014   Rash 01/09/2014   History of gestational diabetes 02/15/2013   Anemia 02/15/2013    Past Medical History:  Diagnosis Date   Allergy    Anemia    as a child   Anxiety    Arthritis    Depression    Gestational diabetes    Hyperlipidemia due to dietary fat intake 04/25/2014   Hypertension    Hypothyroidism    Pneumonia    as a child   PONV (postoperative nausea and vomiting)    with c sections   Pre-diabetes      Transfusion: none   Consultants (if any):   Discharged Condition: Improved  Hospital Course: Kristen Aguilar is an 56 y.o. female who was admitted 07/06/2022 with a diagnosis of Total knee replacement status and went to the operating room on 07/06/2022 and underwent the above named procedures.    Surgeries: Procedure(s): COMPUTER ASSISTED TOTAL KNEE ARTHROPLASTY on 07/06/2022 Patient tolerated the surgery well. Taken to PACU where she was stabilized and then transferred to the orthopedic  floor.  Started on Lovenox 30 mg q 12 hrs. as DVTs applied bilaterally at 80 mm.  Bone foam in place. Physical therapy started on day #1 for gait training and transfer. OT started day #1 for ADL and assisted devices.  Patient's foley was d/c on day #1. Patient's IV and hemovac was d/c on day #1.  On post op day #1 completed all PT goals with physical therapy.  Vital signs are stable.  Patient was stable and ready for discharge to home with HHPT.  Implants: DePuy Attune size 4 posterior stabilized femoral component (cemented), size 4 rotating platform tibial component (cemented), 38 mm medialized dome patella (cemented), and a 12 mm stabilized rotating platform polyethylene insert.    She was given perioperative antibiotics:  Anti-infectives (From admission, onward)    Start     Dose/Rate Route Frequency Ordered Stop   07/06/22 1330  ceFAZolin (ANCEF) IVPB 2g/100 mL premix        2 g 200 mL/hr over 30 Minutes Intravenous Every 6 hours 07/06/22 1315 07/06/22 2046   07/06/22 0637  ceFAZolin (ANCEF) 2-4 GM/100ML-% IVPB       Note to Pharmacy: Carma Lair L: cabinet override      07/06/22 0637 07/06/22 0806   07/06/22 0600  ceFAZolin (ANCEF) IVPB 2g/100 mL premix        2 g 200 mL/hr over 30 Minutes Intravenous On call to O.R. 07/06/22 0865 07/06/22 0750     .  She was given sequential compression devices, early ambulation, and Lovenox, teds for DVT prophylaxis.  She benefited maximally from the hospital stay and there were no complications.    Recent vital signs:  Vitals:   07/07/22 0447 07/07/22 0816  BP: 139/67 (!) 143/78  Pulse: 94 89  Resp: 17 20  Temp: 98.1 F (36.7 C) 98 F (36.7 C)  SpO2: 96% 96%    Recent laboratory studies:  Lab Results  Component Value Date   HGB 13.6 06/28/2022   HGB 12.9 05/09/2021   HGB 14.6 11/23/2020   Lab Results  Component Value Date   WBC 10.9 (H) 06/28/2022   PLT 331 06/28/2022   No results found for: "INR" Lab Results   Component Value Date   NA 139 06/28/2022   K 3.4 (L) 06/28/2022   CL 104 06/28/2022   CO2 27 06/28/2022   BUN 14 06/28/2022   CREATININE 0.93 06/28/2022   GLUCOSE 145 (H) 06/28/2022    Discharge Medications:   Allergies as of 07/07/2022       Reactions   Penicillins    IgE = 67 (WNL) on 06/28/2022 Tolerated cefazolin        Medication List     TAKE these medications    acetaminophen 650 MG CR tablet Commonly known as: TYLENOL Take 650 mg by mouth 2 (two) times daily.   busPIRone 5 MG tablet Commonly known as: BUSPAR Take 1 tablet (5 mg total) by mouth 2 (two) times daily as needed.   celecoxib 200 MG capsule Commonly known as: CELEBREX Take 1 capsule (200 mg total) by mouth 2 (two) times daily for 14 days.   cetirizine 10 MG tablet Commonly known as: ZYRTEC Take 10 mg by mouth daily.   citalopram 20 MG tablet Commonly known as: CELEXA TAKE 1 AND 1/2 TABLETS BY  MOUTH DAILY   Co Q 10 100 MG Caps Take 50 mg by mouth daily.   enoxaparin 40 MG/0.4ML injection Commonly known as: LOVENOX Inject 0.4 mLs (40 mg total) into the skin daily for 14 days.   Fiber Choice Prebiotic Fiber 1.5 g Chew Generic drug: Inulin Chew 1 tablet by mouth daily. Takes 1/4 chew daily   fish oil-omega-3 fatty acids 1000 MG capsule Take 2 g by mouth daily.   fluticasone 50 MCG/ACT nasal spray Commonly known as: FLONASE USE 2 SPRAYS IN BOTH  NOSTRILS DAILY   levothyroxine 75 MCG tablet Commonly known as: SYNTHROID TAKE 1 TABLET BY MOUTH  DAILY BEFORE BREAKFAST   lisinopril 20 MG tablet Commonly known as: ZESTRIL Take 1 tablet (20 mg total) by mouth daily.   loperamide 2 MG tablet Commonly known as: IMODIUM A-D Take 2 mg by mouth 4 (four) times daily as needed for diarrhea or loose stools.   multivitamin tablet Take 1 tablet by mouth daily.   OVER THE COUNTER MEDICATION as needed. Emu cream blue rub topical   OVER THE COUNTER MEDICATION as needed. Equate- Childrens  cough and cold syrup   oxyCODONE 5 MG immediate release tablet Commonly known as: Oxy IR/ROXICODONE Take 1 tablet (5 mg total) by mouth every 4 (four) hours as needed for moderate pain (pain score 4-6).   rosuvastatin 20 MG tablet Commonly known as: CRESTOR Take 1 tablet (20 mg total) by mouth daily.   senna-docusate 8.6-50 MG tablet Commonly known as: Senokot-S Take 1 tablet by mouth 2 (two) times daily.   traMADol 50 MG tablet Commonly known as: ULTRAM Take 1-2 tablets (50-100 mg total) by mouth every 4 (four) hours as needed for moderate pain.  VITAMIN D3 PO Take 1,000 mcg by mouth daily.               Durable Medical Equipment  (From admission, onward)           Start     Ordered   07/06/22 1316  DME Walker rolling  Once       Question:  Patient needs a walker to treat with the following condition  Answer:  Total knee replacement status   07/06/22 1315   07/06/22 1316  DME Bedside commode  Once       Question:  Patient needs a bedside commode to treat with the following condition  Answer:  Total knee replacement status   07/06/22 1315            Diagnostic Studies: DG Knee Right Port  Result Date: 07/06/2022 CLINICAL DATA:  Postop knee replacement EXAM: PORTABLE RIGHT KNEE - 1-2 VIEW COMPARISON:  07/12/2020 FINDINGS: Changes of right knee replacement. No hardware bony complicating feature. IMPRESSION: No visible complicating feature. Electronically Signed   By: Rolm Baptise M.D.   On: 07/06/2022 11:25    Disposition: Discharge disposition: 06-Home-Health Care Svc          Follow-up Information     Fausto Skillern, PA-C Follow up on 07/20/2022.   Specialty: Orthopedic Surgery Why: at 1:15pm Contact information: [place] [address] [phone]         Dereck Leep, MD Follow up on 08/16/2022.   Specialty: Orthopedic Surgery Why: at 10:30am Contact  information: [place] [address] [phone]                  Signed: Feliberto Gottron 07/07/2022, 11:49 AM

## 2022-07-07 NOTE — Progress Notes (Signed)
   Subjective: 1 Day Post-Op Procedure(s) (LRB): COMPUTER ASSISTED TOTAL KNEE ARTHROPLASTY (Right) Patient reports pain as mild.   Patient is well, and has had no acute complaints or problems Denies any CP, SOB, ABD pain. We will continue therapy today.  Plan is to go Home after hospital stay.  Objective: Vital signs in last 24 hours: Temp:  [97.4 F (36.3 C)-98.2 F (36.8 C)] 98.1 F (36.7 C) (09/23 0447) Pulse Rate:  [85-98] 94 (09/23 0447) Resp:  [13-20] 17 (09/23 0447) BP: (133-147)/(61-82) 139/67 (09/23 0447) SpO2:  [94 %-96 %] 96 % (09/23 0447)  Intake/Output from previous day: 09/22 0701 - 09/23 0700 In: 2835.8 [P.O.:240; I.V.:1995.5; IV Piggyback:600.3] Out: 2460 [Urine:2200; Drains:210; Blood:50] Intake/Output this shift: No intake/output data recorded.  No results for input(s): "HGB" in the last 72 hours. No results for input(s): "WBC", "RBC", "HCT", "PLT" in the last 72 hours. No results for input(s): "NA", "K", "CL", "CO2", "BUN", "CREATININE", "GLUCOSE", "CALCIUM" in the last 72 hours. No results for input(s): "LABPT", "INR" in the last 72 hours.  EXAM General - Patient is Alert, Appropriate, and Oriented Extremity - Neurovascular intact Sensation intact distally Intact pulses distally Dorsiflexion/Plantar flexion intact No cellulitis present Compartment soft Dressing - dressing C/D/I and no drainage, Hemovac removed Motor Function - intact, moving foot and toes well on exam.   Past Medical History:  Diagnosis Date   Allergy    Anemia    as a child   Anxiety    Arthritis    Depression    Gestational diabetes    Hyperlipidemia due to dietary fat intake 04/25/2014   Hypertension    Hypothyroidism    Pneumonia    as a child   PONV (postoperative nausea and vomiting)    with c sections   Pre-diabetes     Assessment/Plan:   1 Day Post-Op Procedure(s) (LRB): COMPUTER ASSISTED TOTAL KNEE ARTHROPLASTY (Right) Principal Problem:   Total knee  replacement status Active Problems:   Morbid obesity ([place])  Estimated body mass index is 40.87 kg/m as calculated from the following:   Height as of this encounter: 5\' 4"  (1.626 m).   Weight as of this encounter: 108 kg. Advance diet Up with therapy Pain well controlled Vital signs are stable Management to assist with discharge to home with home health PT today pending completion of PT goals  DVT Prophylaxis - Lovenox, TED hose, and SCDs Weight-Bearing as tolerated to right leg   T. Rachelle Hora, PA-C [place] 07/07/2022, 7:21 AM

## 2022-07-07 NOTE — Progress Notes (Signed)
Physical Therapy Treatment Patient Details Name: Kristen Aguilar MRN: [id number] DOB: 1966/10/13 Today's Date: 07/07/2022   History of Present Illness Pt is a 56 y/o F admitted on 07/06/22 for scheduled R TKA. PMH: HTN, HLD    PT Comments    Pt was long sitting in bed upon arriving with RLE resting in bone foam and polar care running. MD at bedside. She is A and O x 4 and agreeable to session. Very motivated and wanting to DC home if able. Pt demonstrated safe abilities to stand, ambulate and perform stairs. Spouse was present throughout session and will be assisting pt at DC. She also is progressing well with her ROM. AROM RLE 2-94 degrees. Pt is unfortunately unable to receive HHPT but will be able to go to OPPT at DC. Recommend continued skilled PT at DC to address deficits while maximizing her independence with all ADLs.    Recommendations for follow up therapy are one component of a multi-disciplinary discharge planning process, led by the attending physician.  Recommendations may be updated based on patient status, additional functional criteria and insurance authorization.  Follow Up Recommendations  Home health PT     Assistance Recommended at Discharge Intermittent Supervision/Assistance  Patient can return home with the following A little help with bathing/dressing/bathroom;A little help with walking and/or transfers;Assistance with cooking/housework;Help with stairs or ramp for entrance;Assist for transportation   Equipment Recommendations  None recommended by PT       Precautions / Restrictions Precautions Precautions: Fall Restrictions Weight Bearing Restrictions: Yes RLE Weight Bearing: Weight bearing as tolerated     Mobility  Bed Mobility Overal bed mobility: Needs Assistance Bed Mobility: Supine to Sit, Sit to Supine  Supine to sit: Supervision, HOB elevated Sit to supine: Supervision   General bed mobility comments: pt was able to exit bed without physical  assistance. increased time but no safety concerns    Transfers Overall transfer level: Needs assistance Equipment used: Rolling walker (2 wheels) Transfers: Sit to/from Stand Sit to Stand: Supervision    General transfer comment: pt was able to perform STS from a variety of surfaces. no physical assistance or difficulty noted    Ambulation/Gait Ambulation/Gait assistance: Supervision Gait Distance (Feet): 200 Feet Assistive device: Rolling walker (2 wheels) Gait Pattern/deviations: Step-through pattern, Antalgic Gait velocity: decreased     General Gait Details: pt was able to ambulate ~ 200 ft with RW without LOB or safety concern.   Stairs Stairs: Yes Stairs assistance: Min guard Stair Management: No rails, Backwards, Forwards, With walker, Step to pattern Number of Stairs: 4 General stair comments: pt does not have rails on stairs at home. She was still able to safely ascend/descend 4 stair with RW + husbands assistance. Both pt and spouse feel they can safely enter/exit home   Balance Overall balance assessment: Modified Independent Sitting-balance support: Feet supported Sitting balance-Leahy Scale: Good     Standing balance support: Reliant on assistive device for balance, During functional activity, Bilateral upper extremity supported Standing balance-Leahy Scale: Good       Cognition Arousal/Alertness: Awake/alert Behavior During Therapy: WFL for tasks assessed/performed Overall Cognitive Status: Within Functional Limits for tasks assessed      General Comments: Pt is A and O x 4        Exercises Total Joint Exercises Ankle Circles/Pumps: AROM, 10 reps Quad Sets: AROM, 10 reps Goniometric ROM: 2-94        Pertinent Vitals/Pain Pain Assessment Pain Assessment: 0-10 Pain Score: 4  Pain Location: R knee with OOB movement Pain Descriptors / Indicators: Discomfort, Grimacing, Guarding, Aching Pain Intervention(s): Limited activity within patient's  tolerance, Monitored during session, Premedicated before session, Repositioned, Ice applied    Home Living Family/patient expects to be discharged to:: Private residence Living Arrangements: Spouse/significant other;Children Available Help at Discharge: Family Type of Home: House Home Access: Stairs to enter Entrance Stairs-Rails: None Entrance Stairs-Number of Steps: 4   Home Layout: One level Home Equipment: Conservation officer, nature (2 wheels);Cane - single point          PT Goals (current goals can now be found in the care plan section) Acute Rehab PT Goals Patient Stated Goal: go home Progress towards PT goals: Progressing toward goals    Frequency    BID      PT Plan Current plan remains appropriate       AM-PAC PT "6 Clicks" Mobility   Outcome Measure  Help needed turning from your back to your side while in a flat bed without using bedrails?: None Help needed moving from lying on your back to sitting on the side of a flat bed without using bedrails?: A Little Help needed moving to and from a bed to a chair (including a wheelchair)?: A Little Help needed standing up from a chair using your arms (e.g., wheelchair or bedside chair)?: A Little Help needed to walk in hospital room?: A Little Help needed climbing 3-5 steps with a railing? : A Little 6 Click Score: 19    End of Session   Activity Tolerance: Patient tolerated treatment well Patient left: with family/visitor present;in bed;with bed alarm set Nurse Communication: Mobility status PT Visit Diagnosis: Unsteadiness on feet (R26.81);Muscle weakness (generalized) (M62.81);Difficulty in walking, not elsewhere classified (R26.2);Other abnormalities of gait and mobility (R26.89)     Time: 1025-1050 PT Time Calculation (min) (ACUTE ONLY): 25 min  Charges:  $Gait Training: 23-37 mins                     Julaine Fusi PTA 07/07/22, 12:34 PM

## 2022-07-07 NOTE — TOC Progression Note (Addendum)
Transition of Care [facility]) - Progression Note    Patient Details  Name: Kristen Aguilar MRN: [id number] Date of Birth: 06-07-1966  [facility] CM/SW Contact  Izola Price, RN Phone Number: 07/07/2022, 1:28 PM  Clinical Narrative:  9/23: Left VM for patient to return call to RN CM regarding [facility] discharge plan. Checked with [place] via [facility] and pending call back.  Simmie Davies RN CM    Update: Per Unit RN patient has already left hospital. VM has been left on personal cell phone to try to follow up with patient as well as [place] checking on status. Did complete joint class per prior admission CM notes. Barbie Cordney Barstow RN CM  230 pm. Update. [facility] can not take patient on for [facility] as insurance is out of network, but was able to find out that [facility] RN had set up for Outpatient therapy. Simmie Davies RN CM    Follow up information.   Tamala Julian B, PA-C Follow up on 07/20/2022.   Specialty: Orthopedic Surgery Why: at 1:15pm Contact information: [place] [address] [phone]              Dereck Leep, MD Follow up on 08/16/2022.   Specialty: Orthopedic Surgery Why: at 10:30am Contact information: [place] [address] [phone]          Expected Discharge Plan and Services           Expected Discharge Date: 07/07/22                                     Social Determinants of Health (SDOH) Interventions    Readmission Risk Interventions     No data to display

## 2022-07-07 NOTE — Evaluation (Signed)
Occupational Therapy Evaluation Patient Details Name: Kristen Aguilar MRN: [id number] DOB: October 19, 1965 Today's Date: 07/07/2022   History of Present Illness Pt is a 56 y/o F admitted on 07/06/22 for scheduled R TKA. PMH: HTN, HLD   Clinical Impression   Pt seen for OT evaluation this date, POD#1 from above surgery. Pt was independent in all ADLs prior to surgery, however occasionally using SPC for mobility due to knee pain. Pt is eager to return to PLOF with less pain and improved safety and independence. Pt currently requires SUPV for bed mobility, transfers, toileting, grooming; Max A for LB dressing while in seated position due to pain and limited AROM. Pt and spouse instructed in polar care mgmt, falls prevention strategies, home/routines modifications, AE for LB bathing/dressing, correct use of RW, and compression stocking mgt. Both verbalize good understanding and provide teach-back. Pt endorses mild pain in supine, moderate pain in standing and w/ OOB mobility. Good safety awareness, strong family support available at home, pt currently owns all necessary DME. Do not anticipate any OT needs following this hospitalization.      Recommendations for follow up therapy are one component of a multi-disciplinary discharge planning process, led by the attending physician.  Recommendations may be updated based on patient status, additional functional criteria and insurance authorization.   Follow Up Recommendations  No OT follow up    Assistance Recommended at Discharge Frequent or constant Supervision/Assistance  Patient can return home with the following A little help with walking and/or transfers;A little help with bathing/dressing/bathroom;Assistance with cooking/housework;Assist for transportation;Help with stairs or ramp for entrance    Functional Status Assessment  Patient has had a recent decline in their functional status and demonstrates the ability to make significant improvements in  function in a reasonable and predictable amount of time.  Equipment Recommendations  None recommended by OT    Recommendations for Other Services       Precautions / Restrictions Precautions Precautions: Fall Restrictions Weight Bearing Restrictions: Yes RLE Weight Bearing: Weight bearing as tolerated      Mobility Bed Mobility Overal bed mobility: Needs Assistance Bed Mobility: Supine to Sit, Sit to Supine     Supine to sit: Supervision, HOB elevated Sit to supine: Supervision   General bed mobility comments: extra time/effort; pt able to elevate R LE to bed level for sit<supine w/ AAROM and greatly increased time    Transfers Overall transfer level: Needs assistance Equipment used: Rolling walker (2 wheels) Transfers: Sit to/from Stand, Bed to chair/wheelchair/BSC Sit to Stand: Supervision     Step pivot transfers: Supervision     General transfer comment: Educ re: hand placement on RW      Balance Overall balance assessment: Needs assistance Sitting-balance support: Feet supported Sitting balance-Leahy Scale: Good     Standing balance support: Reliant on assistive device for balance, During functional activity, Bilateral upper extremity supported Standing balance-Leahy Scale: Good Standing balance comment: SUPV for safety                           ADL either performed or assessed with clinical judgement   ADL Overall ADL's : Needs assistance/impaired Eating/Feeding: Modified independent   Grooming: Wash/dry hands;Wash/dry face;Standing;Supervision/safety Grooming Details (indicate cue type and reason): SUPV for safety; 1 hand support on sink for stability             Lower Body Dressing: Maximal assistance   Toilet Transfer: Supervision/safety;Comfort height toilet;Grab bars   Toileting-  Clothing Manipulation and Hygiene: Modified independent;Sitting/lateral lean       Functional mobility during ADLs: Supervision/safety General  ADL Comments: SUPV for safety for OOB mobility     Vision Patient Visual Report: No change from baseline       Perception     Praxis      Pertinent Vitals/Pain Pain Assessment Pain Assessment: 0-10 Pain Score: 6  Pain Location: R knee with OOB movement Pain Descriptors / Indicators: Discomfort, Grimacing, Guarding, Aching Pain Intervention(s): Repositioned, Ice applied     Hand Dominance     Extremity/Trunk Assessment Upper Extremity Assessment Upper Extremity Assessment: Overall WFL for tasks assessed   Lower Extremity Assessment Lower Extremity Assessment: RLE deficits/detail RLE Deficits / Details: s/p R TKA; limited strength, ROM; 6/10 pain with movement, 3/10 pain in supine       Communication Communication Communication: No difficulties   Cognition Arousal/Alertness: Awake/alert Behavior During Therapy: WFL for tasks assessed/performed Overall Cognitive Status: Within Functional Limits for tasks assessed                                       General Comments       Exercises Other Exercises Other Exercises: Educ re: polar care, AE for LB dressing, home/routines modifications, falls prevention, pet care mgmt   Shoulder Instructions      Home Living Family/patient expects to be discharged to:: Private residence Living Arrangements: Spouse/significant other;Children Available Help at Discharge: Family Type of Home: House Home Access: Stairs to enter Secretary/administrator of Steps: 4 Entrance Stairs-Rails: None Home Layout: One level     Bathroom Shower/Tub: Producer, television/film/video: Standard     Home Equipment: Agricultural consultant (2 wheels);Cane - single point          Prior Functioning/Environment Prior Level of Function : Independent/Modified Independent;Driving             Mobility Comments: Pt reports she used SPC PRN 2/2 R knee pain. Reports 1 fall back in the spring. ADLs Comments: Independent with BADL and  IADL        OT Problem List: Decreased activity tolerance;Decreased range of motion;Decreased strength;Impaired balance (sitting and/or standing);Decreased knowledge of use of DME or AE;Pain      OT Treatment/Interventions:      OT Goals(Current goals can be found in the care plan section) Acute Rehab OT Goals Patient Stated Goal: to be pain-free in R knee OT Goal Formulation: With patient Time For Goal Achievement: 07/21/22 Potential to Achieve Goals: Good  OT Frequency:      Co-evaluation              AM-PAC OT "6 Clicks" Daily Activity     Outcome Measure Help from another person eating meals?: None Help from another person taking care of personal grooming?: A Little Help from another person toileting, which includes using toliet, bedpan, or urinal?: A Little Help from another person bathing (including washing, rinsing, drying)?: A Little Help from another person to put on and taking off regular upper body clothing?: None Help from another person to put on and taking off regular lower body clothing?: A Lot 6 Click Score: 19   End of Session Equipment Utilized During Treatment: Rolling walker (2 wheels) Nurse Communication: Mobility status  Activity Tolerance: Patient tolerated treatment well Patient left: in bed;with family/visitor present;with call bell/phone within reach  OT Visit Diagnosis: Unsteadiness on  feet (R26.81);Muscle weakness (generalized) (M62.81);Pain Pain - Right/Left: Right Pain - part of body: Knee                Time: 3710-6269 OT Time Calculation (min): 26 min Charges:  OT General Charges $OT Visit: 1 Visit OT Evaluation $OT Eval Low Complexity: 1 Low OT Treatments $Self Care/Home Management : 23-37 mins Josiah Lobo, PhD, MS, OTR/L 07/07/22, 10:33 AM

## 2022-07-08 NOTE — Anesthesia Postprocedure Evaluation (Signed)
Anesthesia Post Note  Patient: Kristen Aguilar  Procedure(s) Performed: COMPUTER ASSISTED TOTAL KNEE ARTHROPLASTY (Right: Knee)  Patient location during evaluation: PACU Anesthesia Type: Spinal Level of consciousness: oriented and awake and alert Pain management: pain level controlled Vital Signs Assessment: post-procedure vital signs reviewed and stable Respiratory status: spontaneous breathing, respiratory function stable and patient connected to nasal cannula oxygen Cardiovascular status: blood pressure returned to baseline and stable Postop Assessment: no headache, no backache and no apparent nausea or vomiting Anesthetic complications: no   No notable events documented.   Last Vitals:  Vitals:   07/07/22 0447 07/07/22 0816  BP: 139/67 (!) 143/78  Pulse: 94 89  Resp: 17 20  Temp: 36.7 C 36.7 C  SpO2: 96% 96%    Last Pain:  Vitals:   07/07/22 1100  TempSrc:   PainSc: 3                  Arita Miss

## 2022-07-09 ENCOUNTER — Encounter: Payer: Self-pay | Admitting: Orthopedic Surgery

## 2022-08-03 ENCOUNTER — Other Ambulatory Visit: Payer: Self-pay | Admitting: Internal Medicine

## 2022-08-30 ENCOUNTER — Ambulatory Visit: Payer: Managed Care, Other (non HMO) | Attending: Cardiology | Admitting: Cardiology

## 2022-08-30 ENCOUNTER — Encounter: Payer: Self-pay | Admitting: Cardiology

## 2022-08-30 VITALS — BP 150/80 | HR 81 | Ht 64.0 in | Wt 238.0 lb

## 2022-08-30 DIAGNOSIS — Z01812 Encounter for preprocedural laboratory examination: Secondary | ICD-10-CM

## 2022-08-30 DIAGNOSIS — R072 Precordial pain: Secondary | ICD-10-CM | POA: Insufficient documentation

## 2022-08-30 DIAGNOSIS — R9431 Abnormal electrocardiogram [ECG] [EKG]: Secondary | ICD-10-CM | POA: Diagnosis not present

## 2022-08-30 DIAGNOSIS — R0609 Other forms of dyspnea: Secondary | ICD-10-CM | POA: Diagnosis not present

## 2022-08-30 DIAGNOSIS — I10 Essential (primary) hypertension: Secondary | ICD-10-CM

## 2022-08-30 DIAGNOSIS — E7849 Other hyperlipidemia: Secondary | ICD-10-CM

## 2022-08-30 MED ORDER — METOPROLOL TARTRATE 100 MG PO TABS: ORAL_TABLET | ORAL | 0 refills | Status: DC

## 2022-08-30 NOTE — Assessment & Plan Note (Signed)
LDL as of March was outstanding.  58.  Doing very well on current dose of Crestor.  Would like to see that her levels continue to stabilize.  We will risk stratify her with Coronary CTA to assess her abnormal EKG and precordial pain.Marland Kitchen

## 2022-08-30 NOTE — Assessment & Plan Note (Signed)
Somewhat atypical symptoms, however given the abnormal EKG we do to exclude ischemia.  She would not build to a treadmill stress test, and with her obesity would make it unlikely to get an accurate test with a stress test.  Plan Coronary CT angiogram

## 2022-08-30 NOTE — Assessment & Plan Note (Signed)
I suspect her exertional dyspnea is related to deconditioning, but with abnormal EKG 1 to exclude structural abnormality of the heart with 2D echo.  Also ischemia with Coronary CTA.

## 2022-08-30 NOTE — Assessment & Plan Note (Signed)
BP is little high today.  We will need to continue to monitor.  She says is better controlled at home.  She was little anxious about coming in.  She is on lisinopril 20 mg daily.  Reassess in follow-up.

## 2022-08-30 NOTE — Assessment & Plan Note (Signed)
Somewhat concerning EKG with precordial T wave inversions that are somewhat concerning for anterior ischemia.  She was able to tolerate her surgery and was doing fine without much activity.  She is now walking and not having any symptoms that are overly concerning with exception of the occasional nagging discomfort that is somewhat diffusely across the chest.  It seems more musculoskeletal or just respiratory in nature, but need to exclude ischemia based on his EKG.  We will also need to ensure stable cardiac function.  Plan: 2D echocardiogram and Coronary CTA

## 2022-08-30 NOTE — Patient Instructions (Addendum)
Medication Instructions:  - Your physician recommends that you continue on your current medications as directed. Please refer to the Current Medication list given to you today. *If you need a refill on your cardiac medications before your next appointment, please call your pharmacy*   Lab Work: - Your physician recommends that you return for lab work: Prior to your CT scan  [employer]  Medical Mall Entrance at [facility] 1st desk on the right to check in (REGISTRATION)  Lab hours: Monday- Friday (7:30 am- 5:30 pm) (Labs are done on a walk in basis, so no appointment is required)   If you have labs (blood work) drawn today and your tests are completely normal, you will receive your results only by: MyChart Message (if you have MyChart) OR A paper copy in the mail If you have any lab test that is abnormal or we need to change your treatment, we will call you to review the results.   Testing/Procedures:  1) Echocardiogram: - Your physician has requested that you have an echocardiogram. Echocardiography is a painless test that uses sound waves to create images of your heart. It provides your doctor with information about the size and shape of your heart and how well your heart's chambers and valves are working. This procedure takes approximately one hour. There are no restrictions for this procedure. There is a possibility that an IV may need to be started during your test to inject an image enhancing agent. This is done to obtain more optimal pictures of your heart. Therefore we ask that you do at least drink some water prior to coming in to hydrate your veins.   Please do NOT wear cologne, perfume, aftershave, or lotions (deodorant is allowed). Please arrive 15 minutes prior to your appointment time.     2) Cardiac CT angiogram: Your physician has requested that you have cardiac CT. Cardiac computed tomography (CT) is a painless test that uses an x-ray machine to take clear, detailed pictures of  your heart.    Your cardiac CT will be scheduled at one of the below locations:    [facility] [address] [place], [place] [address] [phone]  OR   [facility] [address] [place], [place] [address] [phone]  If scheduled at [facility] or [facility], please arrive 15 mins early for check-in and test prep.   Please follow these instructions carefully (unless otherwise directed):  We will administer nitroglycerin during this exam.   On the Night Before the Test: Be sure to Drink plenty of water. Do not consume any caffeinated/decaffeinated beverages or chocolate 12 hours prior to your test. Do not take any antihistamines 12 hours prior to your test.  On the Day of the Test: Drink plenty of water until 1 hour prior to the test. Do not eat any food 1 hour prior to test. You may take your regular medications prior to the test.  Take metoprolol (Lopressor) 100 mg two hours prior to test. FEMALES- please wear underwire-free bra if available, avoid dresses & tight clothing       After the Test: Drink plenty of water. After receiving IV contrast, you may experience a mild flushed feeling. This is normal. On occasion, you may experience a mild rash up to 24 hours after the test. This is not dangerous. If this occurs, you can take Benadryl 25 mg and increase your fluid intake. If you experience trouble breathing,  this can be serious. If it is severe call 911 IMMEDIATELY. If it is mild, please call our office.  We will call to schedule your test 2-4 weeks out understanding that some insurance companies will need an authorization prior to the service being performed.   For non-scheduling related questions, please contact the cardiac imaging nurse navigator should you have any questions/concerns: Rockwell AlexandriaSara Wallace, Cardiac Imaging Nurse Navigator Larey BrickMerle  [place], Cardiac Imaging Nurse Navigator [facility] Direct Office Dial: [phone]   For scheduling needs, including cancellations and rescheduling, please call [place], [phone].  Follow-Up: At [facility], you and your health needs are our priority.  As part of our continuing mission to provide you with exceptional heart care, we have created designated Provider Care Teams.  These Care Teams include your primary Cardiologist (physician) and Advanced Practice Providers (APPs -  Physician Assistants and Nurse Practitioners) who all work together to provide you with the care you need, when you need it.  We recommend signing up for the patient portal called "MyChart".  Sign up information is provided on this After Visit Summary.  MyChart is used to connect with patients for Virtual Visits (Telemedicine).  Patients are able to view lab/test results, encounter notes, upcoming appointments, etc.  Non-urgent messages can be sent to your provider as well.   To learn more about what you can do with MyChart, go to [email or web address].    Your next appointment:   After all testing has been completed   The format for your next appointment:   In Person  Provider:   Bryan Lemmaavid Harding, MD    Other Instructions  Echocardiogram An echocardiogram is a test that uses sound waves (ultrasound) to produce images of the heart. Images from an echocardiogram can provide important information about: Heart size and shape. The size and thickness and movement of your heart's walls. Heart muscle function and strength. Heart valve function or if you have stenosis. Stenosis is when the heart valves are too narrow. If blood is flowing backward through the heart valves (regurgitation). A tumor or infectious growth around the heart valves. Areas of heart muscle that are not working well because of poor blood flow or injury from a heart attack. Aneurysm detection. An  aneurysm is a weak or damaged part of an artery wall. The wall bulges out from the normal force of blood pumping through the body. Tell a health care provider about: Any allergies you have. All medicines you are taking, including vitamins, herbs, eye drops, creams, and over-the-counter medicines. Any blood disorders you have. Any surgeries you have had. Any medical conditions you have. Whether you are pregnant or may be pregnant. What are the risks? Generally, this is a safe test. However, problems may occur, including an allergic reaction to dye (contrast) that may be used during the test. What happens before the test? No specific preparation is needed. You may eat and drink normally. What happens during the test?  You will take off your clothes from the waist up and put on a hospital gown. Electrodes or electrocardiogram (ECG)patches may be placed on your chest. The electrodes or patches are then connected to a device that monitors your heart rate and rhythm. You will lie down on a table for an ultrasound exam. A gel will be applied to your chest to help sound waves pass through your skin. A handheld device, called a transducer, will be pressed against your chest and moved over your heart. The transducer produces  sound waves that travel to your heart and bounce back (or "echo" back) to the transducer. These sound waves will be captured in real-time and changed into images of your heart that can be viewed on a video monitor. The images will be recorded on a computer and reviewed by your health care provider. You may be asked to change positions or hold your breath for a short time. This makes it easier to get different views or better views of your heart. In some cases, you may receive contrast through an IV in one of your veins. This can improve the quality of the pictures from your heart. The procedure may vary among health care providers and hospitals. What can I expect after the test? You  may return to your normal, everyday life, including diet, activities, and medicines, unless your health care provider tells you not to do that. Follow these instructions at home: It is up to you to get the results of your test. Ask your health care provider, or the department that is doing the test, when your results will be ready. Keep all follow-up visits. This is important. Summary An echocardiogram is a test that uses sound waves (ultrasound) to produce images of the heart. Images from an echocardiogram can provide important information about the size and shape of your heart, heart muscle function, heart valve function, and other possible heart problems. You do not need to do anything to prepare before this test. You may eat and drink normally. After the echocardiogram is completed, you may return to your normal, everyday life, unless your health care provider tells you not to do that. This information is not intended to replace advice given to you by your health care provider. Make sure you discuss any questions you have with your health care provider. Document Revised: 06/14/2021 Document Reviewed: 05/24/2020 Elsevier Patient Education  2023 Elsevier Inc.   Cardiac CT Angiogram A cardiac CT angiogram is a procedure to look at the heart and the area around the heart. It may be done to help find the cause of chest pains or other symptoms of heart disease. During this procedure, a substance called contrast dye is injected into the blood vessels in the area to be checked. A large X-ray machine, called a CT scanner, then takes detailed pictures of the heart and the surrounding area. The procedure is also sometimes called a coronary CT angiogram, coronary artery scanning, or CTA. A cardiac CT angiogram allows the health care provider to see how well blood is flowing to and from the heart. The health care provider will be able to see if there are any problems, such as: Blockage or narrowing of the  coronary arteries in the heart. Fluid around the heart. Signs of weakness or disease in the muscles, valves, and tissues of the heart. Tell a health care provider about: Any allergies you have. This is especially important if you have had a previous allergic reaction to contrast dye. All medicines you are taking, including vitamins, herbs, eye drops, creams, and over-the-counter medicines. Any blood disorders you have. Any surgeries you have had. Any medical conditions you have. Whether you are pregnant or may be pregnant. Any anxiety disorders, chronic pain, or other conditions you have that may increase your stress or prevent you from lying still. What are the risks? Generally, this is a safe procedure. However, problems may occur, including: Bleeding. Infection. Allergic reactions to medicines or dyes. Damage to other structures or organs. Kidney damage from the contrast  dye that is used. Increased risk of cancer from radiation exposure. This risk is low. Talk with your health care provider about: The risks and benefits of testing. How you can receive the lowest dose of radiation. What happens before the procedure? Wear comfortable clothing and remove any jewelry, glasses, dentures, and hearing aids. Follow instructions from your health care provider about eating and drinking. This may include: For 12 hours before the procedure -- avoid caffeine. This includes tea, coffee, soda, energy drinks, and diet pills. Drink plenty of water or other fluids that do not have caffeine in them. Being well hydrated can prevent complications. For 4-6 hours before the procedure -- stop eating and drinking. The contrast dye can cause nausea, but this is less likely if your stomach is empty. Ask your health care provider about changing or stopping your regular medicines. This is especially important if you are taking diabetes medicines, blood thinners, or medicines to treat problems with erections  (erectile dysfunction). What happens during the procedure?  Hair on your chest may need to be removed so that small sticky patches called electrodes can be placed on your chest. These will transmit information that helps to monitor your heart during the procedure. An IV will be inserted into one of your veins. You might be given a medicine to control your heart rate during the procedure. This will help to ensure that good images are obtained. You will be asked to lie on an exam table. This table will slide in and out of the CT machine during the procedure. Contrast dye will be injected into the IV. You might feel warm, or you may get a metallic taste in your mouth. You will be given a medicine called nitroglycerin. This will relax or dilate the arteries in your heart. The table that you are lying on will move into the CT machine tunnel for the scan. The person running the machine will give you instructions while the scans are being done. You may be asked to: Keep your arms above your head. Hold your breath. Stay very still, even if the table is moving. When the scanning is complete, you will be moved out of the machine. The IV will be removed. The procedure may vary among health care providers and hospitals. What can I expect after the procedure? After your procedure, it is common to have: A metallic taste in your mouth from the contrast dye. A feeling of warmth. A headache from the nitroglycerin. Follow these instructions at home: Take over-the-counter and prescription medicines only as told by your health care provider. If you are told, drink enough fluid to keep your urine pale yellow. This will help to flush the contrast dye out of your body. Most people can return to their normal activities right after the procedure. Ask your health care provider what activities are safe for you. It is up to you to get the results of your procedure. Ask your health care provider, or the department that  is doing the procedure, when your results will be ready. Keep all follow-up visits as told by your health care provider. This is important. Contact a health care provider if: You have any symptoms of allergy to the contrast dye. These include: Shortness of breath. Rash or hives. A racing heartbeat. Summary A cardiac CT angiogram is a procedure to look at the heart and the area around the heart. It may be done to help find the cause of chest pains or other symptoms of heart disease.  During this procedure, a large X-ray machine, called a CT scanner, takes detailed pictures of the heart and the surrounding area after a contrast dye has been injected into blood vessels in the area. Ask your health care provider about changing or stopping your regular medicines before the procedure. This is especially important if you are taking diabetes medicines, blood thinners, or medicines to treat erectile dysfunction. If you are told, drink enough fluid to keep your urine pale yellow. This will help to flush the contrast dye out of your body. This information is not intended to replace advice given to you by your health care provider. Make sure you discuss any questions you have with your health care provider. Document Revised: 01/18/2022 Document Reviewed: 05/27/2019 Elsevier Patient Education  2023 [employer] Inc.  Important Information About Sugar

## 2022-08-30 NOTE — Assessment & Plan Note (Signed)
Discussed importance of weight loss. 

## 2022-08-30 NOTE — Progress Notes (Signed)
Primary Care Provider: Dale Emily, MD [facility] Cardiologist: None Electrophysiologist: None  Clinic Note: Chief Complaint  Patient presents with   Follow-up    6 week F/U-No new cardiac concerns    ===================================  ASSESSMENT/PLAN   Problem List Items Addressed This Visit       Cardiology Problems   Essential hypertension (Chronic)    BP is little high today.  We will need to continue to monitor.  She says is better controlled at home.  She was little anxious about coming in.  She is on lisinopril 20 mg daily.  Reassess in follow-up.      Relevant Medications   metoprolol tartrate (LOPRESSOR) 100 MG tablet   Hyperlipidemia due to dietary fat intake (Chronic)    LDL as of March was outstanding.  58.  Doing very well on current dose of Crestor.  Would like to see that her levels continue to stabilize.  We will risk stratify her with Coronary CTA to assess her abnormal EKG and precordial pain..      Relevant Medications   metoprolol tartrate (LOPRESSOR) 100 MG tablet     Other   Exertional dyspnea    I suspect her exertional dyspnea is related to deconditioning, but with abnormal EKG 1 to exclude structural abnormality of the heart with 2D echo.  Also ischemia with Coronary CTA.      Relevant Orders   Basic metabolic panel   ECHOCARDIOGRAM COMPLETE   Precordial pain - Primary    Somewhat atypical symptoms, however given the abnormal EKG we do to exclude ischemia.  She would not build to a treadmill stress test, and with her obesity would make it unlikely to get an accurate test with a stress test.  Plan Coronary CT angiogram      Relevant Orders   Basic metabolic panel   CT CORONARY MORPH W/CTA COR W/SCORE W/CA W/CM &/OR WO/CM   Morbid obesity (HCC) (Chronic)    Discussed importance of weight loss.      Nonspecific abnormal electrocardiogram (ECG) (EKG) (Chronic)    Somewhat concerning EKG with precordial T wave  inversions that are somewhat concerning for anterior ischemia.  She was able to tolerate her surgery and was doing fine without much activity.  She is now walking and not having any symptoms that are overly concerning with exception of the occasional nagging discomfort that is somewhat diffusely across the chest.  It seems more musculoskeletal or just respiratory in nature, but need to exclude ischemia based on his EKG.  We will also need to ensure stable cardiac function.  Plan: 2D echocardiogram and Coronary CTA      Relevant Orders   Basic metabolic panel   ECHOCARDIOGRAM COMPLETE   Other Visit Diagnoses     Pre-procedure lab exam       Relevant Orders   Basic metabolic panel       ===================================  HPI:    Kristen Aguilar is a 56 y.o. female with a PMH notable for HTN, HLD, hyperglycemia and abnormal EKG below who presents today for hospital follow-up after knee surgery.[name] was seen on July 05, 2022 for preop evaluation at the request of Dr. Lorin Picket based on EKG that had T wave inversions in the precordial leads with no prior EKG to review.  She was seen 1 day prior to a planned knee surgery.  She was completely asymptomatic from a car standpoint with the exception of being somewhat short  of breath walking and doing things because of deconditioning from her knee injury.  We opted to have her proceed with surgery and then return for evaluation based on the EKG once we can see how she does with ambulation.  Recent Hospitalizations:  9/22 - Knee Sgx  Reviewed  CV studies:    The following studies were reviewed today: (if available, images/films reviewed: From Epic Chart or Care Everywhere) None:  Interval History:   [name] we today with her husband overall doing pretty well she is little anxious and therefore blood pressure and heart rate up a little bit but she says her pressures been running 120s 130s at home.  She is  actually feeling much better having had a knee surgery done.  She is recovering very well and has been walking more consistently.  She of course gets some shortness of breath and weak and deconditioned.  Mostly when walking up stairs or with carrying something.  She occasionally will have some discomfort in her chest but is not sure that it is exertional or not.  She thinks that she may just be somewhat concerned but is now hyperaware of her symptoms in the chest because of the EKG discussion. She is not having any resting dyspnea or chest pain.  No PND, orthopnea or edema.  No irregular heartbeats or palpitations. No syncope or near syncope, TIA or amaurosis fugax symptoms.  No claudication.   REVIEWED OF SYSTEMS   Review of Systems  Constitutional:  Negative for malaise/fatigue (She actually says her energy is getting better since she is doing more activity.  She is hoping that she does not overdo it but is happy to be walking without pain.).  HENT:  Negative for congestion.   Respiratory:  Positive for shortness of breath (Only with the overexertion).   Gastrointestinal:  Negative for blood in stool and melena.  Genitourinary:  Negative for hematuria.  Musculoskeletal:  Negative for joint pain (The knee is just stiff not painful).  Neurological:  Negative for dizziness, focal weakness and weakness.  Psychiatric/Behavioral:  Negative for depression and memory loss. The patient is not nervous/anxious and does not have insomnia.     I have reviewed and (if needed) personally updated the patient's problem list, medications, allergies, past medical and surgical history, social and family history.   PAST MEDICAL HISTORY   Past Medical History:  Diagnosis Date   Allergy    Anemia    as a child   Anxiety    Arthritis    Depression    Gestational diabetes    Hyperlipidemia due to dietary fat intake 04/25/2014   Hypertension    Hypothyroidism    Pneumonia    as a child   PONV  (postoperative nausea and vomiting)    with c sections   Pre-diabetes     PAST SURGICAL HISTORY   Past Surgical History:  Procedure Laterality Date   CESAREAN SECTION  2001 & 2004   COLONOSCOPY WITH PROPOFOL N/A 06/09/2018   Procedure: COLONOSCOPY WITH PROPOFOL;  Surgeon: Wyline Mood, MD;  Location: [facility] ENDOSCOPY;  Service: Gastroenterology;  Laterality: N/A;   KNEE ARTHROPLASTY Right 07/06/2022   Procedure: COMPUTER ASSISTED TOTAL KNEE ARTHROPLASTY;  Surgeon: Donato Heinz, MD;  Location: [facility] ORS;  Service: Orthopedics;  Laterality: Right;    Immunization History  Administered Date(s) Administered   Influenza Split 07/18/2014   Influenza Whole 06/11/1981   Influenza,inj,Quad PF,6+ Mos 07/17/2018, 06/18/2019, 07/12/2020, 07/31/2021   Influenza-Unspecified 07/11/2015  Moderna Sars-Covid-2 Vaccination 06/24/2020, 07/31/2020   Tdap 04/02/2013    MEDICATIONS/ALLERGIES   Current Meds  Medication Sig   acetaminophen (TYLENOL) 650 MG CR tablet Take 650 mg by mouth 2 (two) times daily.   cetirizine (ZYRTEC) 10 MG tablet Take 10 mg by mouth daily.   Cholecalciferol (VITAMIN D3 PO) Take 1,000 mcg by mouth daily.   citalopram (CELEXA) 20 MG tablet TAKE 1 AND 1/2 TABLETS BY  MOUTH DAILY   Coenzyme Q10 (CO Q 10) 100 MG CAPS Take 50 mg by mouth daily.    fish oil-omega-3 fatty acids 1000 MG capsule Take 2 g by mouth daily.   fluticasone (FLONASE) 50 MCG/ACT nasal spray USE 2 SPRAYS IN BOTH  NOSTRILS DAILY   Inulin (FIBER CHOICE PREBIOTIC FIBER) 1.5 g CHEW Chew 1 tablet by mouth daily. Takes 1/4 chew daily   levothyroxine (SYNTHROID) 75 MCG tablet TAKE 1 TABLET BY MOUTH  DAILY BEFORE BREAKFAST   lisinopril (ZESTRIL) 20 MG tablet TAKE 1 TABLET BY MOUTH ONCE DAILY   loperamide (IMODIUM A-D) 2 MG tablet Take 2 mg by mouth 4 (four) times daily as needed for diarrhea or loose stools.   metoprolol tartrate (LOPRESSOR) 100 MG tablet Take 1 tablet (100 mg) by mouth 2 hours prior to your Cardiac  CT   Multiple Vitamin (MULTIVITAMIN) tablet Take 1 tablet by mouth daily.   OVER THE COUNTER MEDICATION as needed. Emu cream blue rub topical   OVER THE COUNTER MEDICATION as needed. Equate- Childrens cough and cold syrup   rosuvastatin (CRESTOR) 20 MG tablet TAKE 1 TABLET BY MOUTH  DAILY   senna-docusate (SENOKOT-S) 8.6-50 MG tablet Take 1 tablet by mouth 2 (two) times daily.    Allergies  Allergen Reactions   Penicillins     IgE = 67 (WNL) on 06/28/2022 Tolerated cefazolin    SOCIAL HISTORY/FAMILY HISTORY   Reviewed in Epic:  Pertinent findings:  Social History   Tobacco Use   Smoking status: Never   Smokeless tobacco: Never  Vaping Use   Vaping Use: Never used  Substance Use Topics   Alcohol use: No    Alcohol/week: 0.0 standard drinks of alcohol   Drug use: No   Social History   Social History Narrative   Not on file    OBJCTIVE -PE, EKG, labs   Wt Readings from Last 3 Encounters:  08/30/22 238 lb (108 kg)  07/06/22 238 lb 1.6 oz (108 kg)  07/05/22 238 lb (108 kg)    Physical Exam: Physical Exam Vitals reviewed.  Constitutional:      General: She is not in acute distress.    Appearance: She is obese. She is not ill-appearing or toxic-appearing.     Comments: Well-groomed.  HENT:     Head: Normocephalic and atraumatic.  Neck:     Vascular: No carotid bruit or JVD.  Cardiovascular:     Rate and Rhythm: Normal rate and regular rhythm.     Pulses: Normal pulses.     Heart sounds: No murmur heard.    No friction rub. No gallop.     Comments: Distant heart sounds. Pulmonary:     Effort: Pulmonary effort is normal. No respiratory distress.     Breath sounds: Normal breath sounds. No wheezing, rhonchi or rales.  Chest:     Chest wall: No tenderness.  Musculoskeletal:        General: No swelling. Normal range of motion.     Cervical back: Normal range of motion and  neck supple.  Skin:    General: Skin is warm and dry.  Neurological:     General: No  focal deficit present.     Mental Status: She is alert and oriented to person, place, and time.  Psychiatric:        Mood and Affect: Mood normal.        Behavior: Behavior normal.        Thought Content: Thought content normal.        Judgment: Judgment normal.     Adult ECG Report Not checked  Recent Labs: Reviewed all Lab Results  Component Value Date   CHOL 133 12/22/2021   HDL 49 12/22/2021   LDLCALC 58 12/22/2021   LDLDIRECT 135.3 07/20/2014   TRIG 152 (H) 12/22/2021   CHOLHDL 2.7 12/22/2021   Lab Results  Component Value Date   CREATININE 0.93 06/28/2022   BUN 14 06/28/2022   NA 139 06/28/2022   K 3.4 (L) 06/28/2022   CL 104 06/28/2022   CO2 27 06/28/2022      Latest Ref Rng & Units 06/28/2022    2:13 PM 05/09/2021    9:43 AM 11/23/2020    9:09 AM  CBC  WBC 4.0 - 10.5 K/uL 10.9  9.4  9.4   Hemoglobin 12.0 - 15.0 g/dL 91.413.6  78.212.9  95.614.6   Hematocrit 36.0 - 46.0 % 42.0  38.3  45.3   Platelets 150 - 400 K/uL 331  298  365     Lab Results  Component Value Date   HGBA1C 6.4 (H) 06/28/2022   Lab Results  Component Value Date   TSH 2.820 08/14/2021    ================================================== I spent a total of 22 minutes with the patient spent in direct patient consultation.  Additional time spent with chart review  / charting (studies, outside notes, etc): 19 min Total Time: 41 min  Current medicines are reviewed at length with the patient today.  (+/- concerns) N/A None Notice: This dictation was prepared with Dragon dictation along with smart phrase technology. Any transcriptional errors that result from this process are unintentional and may not be corrected upon review.  Studies Ordered:  Orders Placed This Encounter  Procedures   CT CORONARY MORPH W/CTA COR W/SCORE W/CA W/CM &/OR WO/CM   Basic metabolic panel   ECHOCARDIOGRAM COMPLETE   Meds ordered this encounter  Medications   metoprolol tartrate (LOPRESSOR) 100 MG tablet    Sig: Take  1 tablet (100 mg) by mouth 2 hours prior to your Cardiac CT    Dispense:  1 tablet    Refill:  0    Patient Instructions / Medication Changes & Studies & Tests Ordered   Patient Instructions  Medication Instructions:  - Your physician recommends that you continue on your current medications as directed. Please refer to the Current Medication list given to you today. *If you need a refill on your cardiac medications before your next appointment, please call your pharmacy*   Lab Work: - Your physician recommends that you return for lab work: Prior to your CT scan  [employer]  [facility] 1st desk on the right to check in (REGISTRATION)  Lab hours: Monday- Friday (7:30 am- 5:30 pm) (Labs are done on a walk in basis, so no appointment is required)   If you have labs (blood work) drawn today and your tests are completely normal, you will receive your results only by: MyChart Message (if you have MyChart) OR A paper copy  in the mail If you have any lab test that is abnormal or we need to change your treatment, we will call you to review the results.   Testing/Procedures:  1) Echocardiogram: - Your physician has requested that you have an echocardiogram. Echocardiography is a painless test that uses sound waves to create images of your heart. It provides your doctor with information about the size and shape of your heart and how well your heart's chambers and valves are working. This procedure takes approximately one hour. There are no restrictions for this procedure. There is a possibility that an IV may need to be started during your test to inject an image enhancing agent. This is done to obtain more optimal pictures of your heart. Therefore we ask that you do at least drink some water prior to coming in to hydrate your veins.   Please do NOT wear cologne, perfume, aftershave, or lotions (deodorant is allowed). Please arrive 15 minutes prior to your appointment  time.     2) Cardiac CT angiogram: Your physician has requested that you have cardiac CT. Cardiac computed tomography (CT) is a painless test that uses an x-ray machine to take clear, detailed pictures of your heart.    Your cardiac CT will be scheduled at one of the below locations:    [facility] [address] [place], [place] [address] [phone]  OR   [facility] [address] [place], [place] [address] [phone]  If scheduled at [facility] or [facility], please arrive 15 mins early for check-in and test prep.   Please follow these instructions carefully (unless otherwise directed):  We will administer nitroglycerin during this exam.   On the Night Before the Test: Be sure to Drink plenty of water. Do not consume any caffeinated/decaffeinated beverages or chocolate 12 hours prior to your test. Do not take any antihistamines 12 hours prior to your test.  On the Day of the Test: Drink plenty of water until 1 hour prior to the test. Do not eat any food 1 hour prior to test. You may take your regular medications prior to the test.  Take metoprolol (Lopressor) 100 mg two hours prior to test. FEMALES- please wear underwire-free bra if available, avoid dresses & tight clothing       After the Test: Drink plenty of water. After receiving IV contrast, you may experience a mild flushed feeling. This is normal. On occasion, you may experience a mild rash up to 24 hours after the test. This is not dangerous. If this occurs, you can take Benadryl 25 mg and increase your fluid intake. If you experience trouble breathing, this can be serious. If it is severe call 911 IMMEDIATELY. If it is mild, please call our office.  We will call to schedule your test 2-4 weeks out understanding that some insurance companies will need an authorization prior to the  service being performed.   For non-scheduling related questions, please contact the cardiac imaging nurse navigator should you have any questions/concerns: Rockwell Alexandria, Cardiac Imaging Nurse Navigator Larey Brick, Cardiac Imaging Nurse Navigator [facility] Direct Office Dial: [phone]   For scheduling needs, including cancellations and rescheduling, please call [place], [phone].  Follow-Up: At [facility], you and your health needs are our priority.  As part of our continuing mission to provide you with exceptional heart care, we have created designated Provider Care Teams.  These Care Teams include your primary Cardiologist (physician) and Advanced Practice Providers (APPs -  Physician Assistants and Nurse Practitioners) who all work together to provide you with the care you need, when you need it.  We recommend signing up for the patient portal called "MyChart".  Sign up information is provided on this After Visit Summary.  MyChart is used to connect with patients for Virtual Visits (Telemedicine).  Patients are able to view lab/test results, encounter notes, upcoming appointments, etc.  Non-urgent messages can be sent to your provider as well.   To learn more about what you can do with MyChart, go to [email or web address].    Your next appointment:   After all testing has been completed   The format for your next appointment:   In Person  Provider:   Bryan Lemma, MD    Other Instructions  Echocardiogram An echocardiogram is a test that uses sound waves (ultrasound) to produce images of the heart. Images from an echocardiogram can provide important information about: Heart size and shape. The size and thickness and movement of your heart's walls. Heart muscle function and strength. Heart valve function or if you have stenosis. Stenosis is when the heart valves are too narrow. If blood is flowing backward through the heart  valves (regurgitation). A tumor or infectious growth around the heart valves. Areas of heart muscle that are not working well because of poor blood flow or injury from a heart attack. Aneurysm detection. An aneurysm is a weak or damaged part of an artery wall. The wall bulges out from the normal force of blood pumping through the body. Tell a health care provider about: Any allergies you have. All medicines you are taking, including vitamins, herbs, eye drops, creams, and over-the-counter medicines. Any blood disorders you have. Any surgeries you have had. Any medical conditions you have. Whether you are pregnant or may be pregnant. What are the risks? Generally, this is a safe test. However, problems may occur, including an allergic reaction to dye (contrast) that may be used during the test. What happens before the test? No specific preparation is needed. You may eat and drink normally. What happens during the test?  You will take off your clothes from the waist up and put on a hospital gown. Electrodes or electrocardiogram (ECG)patches may be placed on your chest. The electrodes or patches are then connected to a device that monitors your heart rate and rhythm. You will lie down on a table for an ultrasound exam. A gel will be applied to your chest to help sound waves pass through your skin. A handheld device, called a transducer, will be pressed against your chest and moved over your heart. The transducer produces sound waves that travel to your heart and bounce back (or "echo" back) to the transducer. These sound waves will be captured in real-time and changed into images of your heart that can be viewed on a video monitor. The images will be recorded on a computer and reviewed by your health care provider. You may be asked to change positions or hold your breath for a short time. This makes it easier to get different views or better views of your heart. In some cases, you may receive  contrast through an IV in one of your veins. This can improve the quality of the pictures from your heart. The procedure may vary among health care providers and hospitals. What can I expect after the test? You may return to your normal, everyday life,  including diet, activities, and medicines, unless your health care provider tells you not to do that. Follow these instructions at home: It is up to you to get the results of your test. Ask your health care provider, or the department that is doing the test, when your results will be ready. Keep all follow-up visits. This is important. Summary An echocardiogram is a test that uses sound waves (ultrasound) to produce images of the heart. Images from an echocardiogram can provide important information about the size and shape of your heart, heart muscle function, heart valve function, and other possible heart problems. You do not need to do anything to prepare before this test. You may eat and drink normally. After the echocardiogram is completed, you may return to your normal, everyday life, unless your health care provider tells you not to do that. This information is not intended to replace advice given to you by your health care provider. Make sure you discuss any questions you have with your health care provider. Document Revised: 06/14/2021 Document Reviewed: 05/24/2020 Elsevier Patient Education  2023 Elsevier Inc.   Cardiac CT Angiogram A cardiac CT angiogram is a procedure to look at the heart and the area around the heart. It may be done to help find the cause of chest pains or other symptoms of heart disease. During this procedure, a substance called contrast dye is injected into the blood vessels in the area to be checked. A large X-ray machine, called a CT scanner, then takes detailed pictures of the heart and the surrounding area. The procedure is also sometimes called a coronary CT angiogram, coronary artery scanning, or CTA. A cardiac  CT angiogram allows the health care provider to see how well blood is flowing to and from the heart. The health care provider will be able to see if there are any problems, such as: Blockage or narrowing of the coronary arteries in the heart. Fluid around the heart. Signs of weakness or disease in the muscles, valves, and tissues of the heart. Tell a health care provider about: Any allergies you have. This is especially important if you have had a previous allergic reaction to contrast dye. All medicines you are taking, including vitamins, herbs, eye drops, creams, and over-the-counter medicines. Any blood disorders you have. Any surgeries you have had. Any medical conditions you have. Whether you are pregnant or may be pregnant. Any anxiety disorders, chronic pain, or other conditions you have that may increase your stress or prevent you from lying still. What are the risks? Generally, this is a safe procedure. However, problems may occur, including: Bleeding. Infection. Allergic reactions to medicines or dyes. Damage to other structures or organs. Kidney damage from the contrast dye that is used. Increased risk of cancer from radiation exposure. This risk is low. Talk with your health care provider about: The risks and benefits of testing. How you can receive the lowest dose of radiation. What happens before the procedure? Wear comfortable clothing and remove any jewelry, glasses, dentures, and hearing aids. Follow instructions from your health care provider about eating and drinking. This may include: For 12 hours before the procedure -- avoid caffeine. This includes tea, coffee, soda, energy drinks, and diet pills. Drink plenty of water or other fluids that do not have caffeine in them. Being well hydrated can prevent complications. For 4-6 hours before the procedure -- stop eating and drinking. The contrast dye can cause nausea, but this is less likely if your stomach is empty. Ask  your health care provider about changing or stopping your regular medicines. This is especially important if you are taking diabetes medicines, blood thinners, or medicines to treat problems with erections (erectile dysfunction). What happens during the procedure?  Hair on your chest may need to be removed so that small sticky patches called electrodes can be placed on your chest. These will transmit information that helps to monitor your heart during the procedure. An IV will be inserted into one of your veins. You might be given a medicine to control your heart rate during the procedure. This will help to ensure that good images are obtained. You will be asked to lie on an exam table. This table will slide in and out of the CT machine during the procedure. Contrast dye will be injected into the IV. You might feel warm, or you may get a metallic taste in your mouth. You will be given a medicine called nitroglycerin. This will relax or dilate the arteries in your heart. The table that you are lying on will move into the CT machine tunnel for the scan. The person running the machine will give you instructions while the scans are being done. You may be asked to: Keep your arms above your head. Hold your breath. Stay very still, even if the table is moving. When the scanning is complete, you will be moved out of the machine. The IV will be removed. The procedure may vary among health care providers and hospitals. What can I expect after the procedure? After your procedure, it is common to have: A metallic taste in your mouth from the contrast dye. A feeling of warmth. A headache from the nitroglycerin. Follow these instructions at home: Take over-the-counter and prescription medicines only as told by your health care provider. If you are told, drink enough fluid to keep your urine pale yellow. This will help to flush the contrast dye out of your body. Most people can return to their normal  activities right after the procedure. Ask your health care provider what activities are safe for you. It is up to you to get the results of your procedure. Ask your health care provider, or the department that is doing the procedure, when your results will be ready. Keep all follow-up visits as told by your health care provider. This is important. Contact a health care provider if: You have any symptoms of allergy to the contrast dye. These include: Shortness of breath. Rash or hives. A racing heartbeat. Summary A cardiac CT angiogram is a procedure to look at the heart and the area around the heart. It may be done to help find the cause of chest pains or other symptoms of heart disease. During this procedure, a large X-ray machine, called a CT scanner, takes detailed pictures of the heart and the surrounding area after a contrast dye has been injected into blood vessels in the area. Ask your health care provider about changing or stopping your regular medicines before the procedure. This is especially important if you are taking diabetes medicines, blood thinners, or medicines to treat erectile dysfunction. If you are told, drink enough fluid to keep your urine pale yellow. This will help to flush the contrast dye out of your body. This information is not intended to replace advice given to you by your health care provider. Make sure you discuss any questions you have with your health care provider. Document Revised: 01/18/2022 Document Reviewed: 05/27/2019 Elsevier Patient Education  2023 [employer].  Important  Information About Sugar             Marykay Lex, MD, MS Bryan Lemma, M.D., M.S. Interventional Cardiologist  [facility]   [address]; [address] [place], [place]  [address] [phone]           Fax [phone]    Thank you for choosing [facility] in [place]!!

## 2022-08-31 ENCOUNTER — Other Ambulatory Visit: Payer: Self-pay | Admitting: Cardiology

## 2022-09-11 MED FILL — Cholecalciferol Tab 25 MCG (1000 Unit): ORAL | Qty: 4 | Status: AC

## 2022-09-17 ENCOUNTER — Other Ambulatory Visit (HOSPITAL_COMMUNITY): Payer: Managed Care, Other (non HMO)

## 2022-09-18 ENCOUNTER — Other Ambulatory Visit
Admission: RE | Admit: 2022-09-18 | Discharge: 2022-09-18 | Disposition: A | Discharge status: Home / Self Care | Payer: Managed Care, Other (non HMO) | Attending: Cardiology | Admitting: Cardiology

## 2022-09-18 ENCOUNTER — Other Ambulatory Visit: Payer: Self-pay | Admitting: Cardiology

## 2022-09-18 DIAGNOSIS — R072 Precordial pain: Secondary | ICD-10-CM | POA: Diagnosis present

## 2022-09-18 DIAGNOSIS — Z01812 Encounter for preprocedural laboratory examination: Secondary | ICD-10-CM | POA: Insufficient documentation

## 2022-09-18 DIAGNOSIS — R9431 Abnormal electrocardiogram [ECG] [EKG]: Secondary | ICD-10-CM | POA: Diagnosis present

## 2022-09-18 DIAGNOSIS — R0609 Other forms of dyspnea: Secondary | ICD-10-CM | POA: Diagnosis present

## 2022-09-18 LAB — BASIC METABOLIC PANEL
Anion gap: 8 (ref 5–15)
BUN: 16 mg/dL (ref 6–20)
CO2: 26 mmol/L (ref 22–32)
Calcium: 9.2 mg/dL (ref 8.9–10.3)
Chloride: 106 mmol/L (ref 98–111)
Creatinine, Ser: 0.88 mg/dL (ref 0.44–1.00)
GFR, Estimated: >60 mL/min (ref >60)
Glucose, Bld: 118 mg/dL — ABNORMAL HIGH (ref 70–99)
Potassium: 4.3 mmol/L (ref 3.5–5.1)
Sodium: 140 mmol/L (ref 135–145)

## 2022-09-20 ENCOUNTER — Ambulatory Visit: Payer: Managed Care, Other (non HMO) | Attending: Cardiology

## 2022-09-20 DIAGNOSIS — R0609 Other forms of dyspnea: Secondary | ICD-10-CM | POA: Diagnosis not present

## 2022-09-20 DIAGNOSIS — R9431 Abnormal electrocardiogram [ECG] [EKG]: Secondary | ICD-10-CM

## 2022-09-20 LAB — ECHOCARDIOGRAM COMPLETE
AR max vel: 2.63 cm2
AV Area VTI: 2.7 cm2
AV Area mean vel: 2.69 cm2
AV Mean grad: 4 mmHg
AV Peak grad: 7.7 mmHg
Ao pk vel: 1.39 m/s
Area-P 1/2: 4.39 cm2
S' Lateral: 2.8 cm

## 2022-09-21 ENCOUNTER — Telehealth (HOSPITAL_COMMUNITY): Payer: Self-pay | Admitting: *Deleted

## 2022-09-21 NOTE — Telephone Encounter (Signed)
Reaching out to patient to offer assistance regarding upcoming cardiac imaging study; pt verbalizes understanding of appt date/time, parking situation and where to check in, pre-test NPO status and medications ordered, and verified current allergies; name and call back number provided for further questions should they arise ? ?Rome Schlauch RN Navigator Cardiac Imaging ?[facility] ?[phone] office ?[phone] cell ? ?Patient to take 100mg metoprolol tartrate two hours prior to her cardiac CT scan.  ?

## 2022-09-24 ENCOUNTER — Ambulatory Visit
Admission: RE | Admit: 2022-09-24 | Discharge: 2022-09-24 | Disposition: A | Discharge status: Home / Self Care | Payer: Managed Care, Other (non HMO) | Source: Ambulatory Visit | Attending: Cardiology | Admitting: Cardiology

## 2022-09-24 ENCOUNTER — Other Ambulatory Visit: Payer: Self-pay | Admitting: Internal Medicine

## 2022-09-24 DIAGNOSIS — R072 Precordial pain: Secondary | ICD-10-CM | POA: Diagnosis not present

## 2022-09-24 MED ORDER — IOHEXOL 350 MG/ML SOLN
80.0000 mL | Freq: Once | INTRAVENOUS | Status: AC | PRN
  Administered 2022-09-24: 80 mL via INTRAVENOUS

## 2022-09-24 MED ORDER — METOPROLOL TARTRATE 5 MG/5ML IV SOLN
10.0000 mg | Freq: Once | INTRAVENOUS | Status: AC
  Administered 2022-09-24: 10 mg via INTRAVENOUS

## 2022-09-24 MED ORDER — NITROGLYCERIN 0.4 MG SL SUBL
0.8000 mg | SUBLINGUAL_TABLET | Freq: Once | SUBLINGUAL | Status: AC
  Administered 2022-09-24: 0.8 mg via SUBLINGUAL

## 2022-09-24 NOTE — Progress Notes (Signed)
Patient tolerated procedure well. Ambulate w/o difficulty. Denies any lightheadedness or being dizzy. Pt denies any pain at this time. Sitting in chair. Pt is encouraged to drink additional water throughout the day and reason explained to patient. Patient verbalized understanding and all questions answered. ABC intact. No further needs at this time. Discharge from procedure area w/o issues. 

## 2022-09-25 ENCOUNTER — Encounter: Payer: Self-pay | Admitting: Cardiology

## 2022-09-25 ENCOUNTER — Ambulatory Visit: Payer: Managed Care, Other (non HMO) | Attending: Cardiology | Admitting: Cardiology

## 2022-09-25 VITALS — BP 134/74 | HR 80 | Ht 64.0 in | Wt 234.8 lb

## 2022-09-25 DIAGNOSIS — E7849 Other hyperlipidemia: Secondary | ICD-10-CM | POA: Diagnosis not present

## 2022-09-25 DIAGNOSIS — R0609 Other forms of dyspnea: Secondary | ICD-10-CM

## 2022-09-25 DIAGNOSIS — I10 Essential (primary) hypertension: Secondary | ICD-10-CM | POA: Diagnosis not present

## 2022-09-25 NOTE — Progress Notes (Signed)
Cardiology Clinic Note   Patient Name: Kristen Aguilar Date of Encounter: 09/25/2022  Primary Care Provider:  Dale Flordell Hills, MD Primary Cardiologist:  Bryan Lemma, MD  Patient Profile    56 year old female with a past medical history of essential hypertension, hyperlipidemia, morbid obesity, anemia, who had recent complaints of exertional dyspnea and chest pain, who is here today to follow-up on her symptoms and recent outpatient testing.  Past Medical History    Past Medical History:  Diagnosis Date   Allergy    Anemia    as a child   Anxiety    Arthritis    Depression    Gestational diabetes    Hyperlipidemia due to dietary fat intake 04/25/2014   Hypertension    Hypothyroidism    Pneumonia    as a child   PONV (postoperative nausea and vomiting)    with c sections   Pre-diabetes    Past Surgical History:  Procedure Laterality Date   CESAREAN SECTION  2001 & 2004   COLONOSCOPY WITH PROPOFOL N/A 06/09/2018   Procedure: COLONOSCOPY WITH PROPOFOL;  Surgeon: Wyline Mood, MD;  Location: [facility] ENDOSCOPY;  Service: Gastroenterology;  Laterality: N/A;   KNEE ARTHROPLASTY Right 07/06/2022   Procedure: COMPUTER ASSISTED TOTAL KNEE ARTHROPLASTY;  Surgeon: Donato Heinz, MD;  Location: [facility] ORS;  Service: Orthopedics;  Laterality: Right;    Allergies  Allergies  Allergen Reactions   Penicillins     IgE = 67 (WNL) on 06/28/2022 Tolerated cefazolin    History of Present Illness    Kristen Aguilar. Kristen Aguilar is a 56 year old female with previously mentioned past medical history of hypertension, hyperlipidemia, morbid obesity, anemia, and recent complaints of exertional dyspnea and chest pain as well as recent knee surgery.  She was last seen in clinic by Dr. Herbie Baltimore on 08/30/2022 stating that overall she was doing well but a little anxious her blood pressure and heart rate have been up a little bit.  And she was recovering very well from a knee surgery and been walking more  consistently.  She is getting shortness of breath and occasionally was having chest discomfort that she was unable to determine whether it was exertional or not.  She was also found to have an abnormal EKG at her preop evaluation that had T wave inversions in the precordial leads with no prior EKG for comparison.  She was scheduled for an echocardiogram and a coronary CTA.  Echocardiogram completed 12//23 revealed LVEF of 60-65%, no regional wall motion abnormalities, G2 DD, with no valvular abnormalities noted.  Coronary CTA was completed 09/24/2022 which revealed a normal coronary calcium score of 0.  Normal coronary origin with right dominance, no evidence of CAD, CAD-RADS 0, consider nonatherosclerotic causes of chest pain.  She returns to clinic today accompanied by her husband stating that overall she has been doing well.  She denies any recurrent chest discomfort.  She does continue to have dyspnea on exertion.  This is status post surgery to the right knee.  Which is likely component of postoperative deconditioning.  She occasionally has swelling only to the operative site or when she is standing for extreme amounts of time at [place] [employer] walking and standing.  She denies any recent hospitalizations or visits to the emergency department.  Home Medications    Current Outpatient Medications  Medication Sig Dispense Refill   acetaminophen (TYLENOL) 650 MG CR tablet Take 650 mg by mouth 2 (two) times daily.  cetirizine (ZYRTEC) 10 MG tablet Take 10 mg by mouth daily.     Cholecalciferol (VITAMIN D3 PO) Take 1,000 mcg by mouth daily.     citalopram (CELEXA) 20 MG tablet TAKE 1 AND 1/2 TABLETS BY  MOUTH DAILY 135 tablet 3   Coenzyme Q10 (CO Q 10) 100 MG CAPS Take 100 mg by mouth daily.     fish oil-omega-3 fatty acids 1000 MG capsule Take 2 g by mouth daily.     fluticasone (FLONASE) 50 MCG/ACT nasal spray USE 2 SPRAYS IN BOTH  NOSTRILS DAILY 48 g 3   Inulin (FIBER CHOICE PREBIOTIC FIBER)  1.5 g CHEW Chew 1 tablet by mouth daily. Takes 1/4 chew daily     levothyroxine (SYNTHROID) 75 MCG tablet TAKE 1 TABLET BY MOUTH  DAILY BEFORE BREAKFAST 90 tablet 3   lisinopril (ZESTRIL) 20 MG tablet TAKE 1 TABLET BY MOUTH ONCE DAILY 90 tablet 3   loperamide (IMODIUM A-D) 2 MG tablet Take 2 mg by mouth 4 (four) times daily as needed for diarrhea or loose stools.     Multiple Vitamin (MULTIVITAMIN) tablet Take 1 tablet by mouth daily.     OVER THE COUNTER MEDICATION Apply 1 Application topically as needed (knee pain). Emu cream blue rub topical     OVER THE COUNTER MEDICATION as needed. Equate- Childrens cough and cold syrup     rosuvastatin (CRESTOR) 20 MG tablet TAKE 1 TABLET BY MOUTH  DAILY 90 tablet 3   senna-docusate (SENOKOT-S) 8.6-50 MG tablet Take 1 tablet by mouth 2 (two) times daily. (Patient not taking: Reported on 09/25/2022) 30 tablet 0   No current facility-administered medications for this visit.     Family History    Family History  Problem Relation Age of Onset   Arrhythmia Mother        A-FIB   Hypertension Mother    Atrial fibrillation Mother    Atrial fibrillation Father    Arrhythmia Father        A-FIB   Hypertension Father    Fibromyalgia Sister    Hypertension Maternal Grandmother    Diabetes Maternal Grandmother    Breast cancer Paternal Grandmother    She indicated that her mother is alive. She indicated that her father is alive. She indicated that her sister is alive. She indicated that the status of her maternal grandmother is unknown. She indicated that the status of her paternal grandmother is unknown.  Social History    Social History   Socioeconomic History   Marital status: Married    Spouse name: Dorinda Hill   Number of children: 2   Years of education: Not on file   Highest education level: Not on file  Occupational History   Not on file  Tobacco Use   Smoking status: Never   Smokeless tobacco: Never  Vaping Use   Vaping Use: Never used   Substance and Sexual Activity   Alcohol use: No    Alcohol/week: 0.0 standard drinks of alcohol   Drug use: No   Sexual activity: Not on file  Other Topics Concern   Not on file  Social History Narrative   Not on file   Social Determinants of Health   Financial Resource Strain: Not on file  Food Insecurity: No Food Insecurity (07/06/2022)   Hunger Vital Sign    Worried About Running Out of Food in the Last Year: Never true    Ran Out of Food in the Last Year: Never true  Transportation  Needs: No Transportation Needs (07/06/2022)   PRAPARE - Administrator, Civil Service (Medical): No    Lack of Transportation (Non-Medical): No  Physical Activity: Not on file  Stress: Not on file  Social Connections: Not on file  Intimate Partner Violence: Not At Risk (07/06/2022)   Humiliation, Afraid, Rape, and Kick questionnaire    Fear of Current or Ex-Partner: No    Emotionally Abused: No    Physically Abused: No    Sexually Abused: No     Review of Systems    General:  No chills, fever, night sweats or endorses weight changes.  Cardiovascular:  No chest pain, endorses dyspnea on exertion, endorses occasional peripheral edema, orthopnea, palpitations, paroxysmal nocturnal dyspnea. Dermatological: No rash, lesions/masses Respiratory: No cough, endorses dyspnea on exertion Urologic: No hematuria, dysuria Abdominal:   No nausea, vomiting, diarrhea, bright red blood per rectum, melena, or hematemesis Neurologic:  No visual changes, wkns, changes in mental status. All other systems reviewed and are otherwise negative except as noted above.   Physical Exam    VS:  BP 134/74   Pulse 80   Ht 5\' 4"  (1.626 m)   Wt 234 lb 12.8 oz (106.5 kg)   LMP 10/09/2014   SpO2 95%   BMI 40.30 kg/m  , BMI Body mass index is 40.3 kg/m.     GEN: Well nourished, well developed, in no acute distress. HEENT: normal.  Glasses on. Neck: Supple, no JVD, carotid bruits, or masses. Cardiac: RRR,  no murmurs, heart sounds are distant, rubs, or gallops. No clubbing, cyanosis, edema.  Radials 2+/PT 2+ and equal bilaterally.  Respiratory:  Respirations regular and unlabored, clear to auscultation bilaterally. GI: Soft, nontender, nondistended, BS + x 4. MS: no deformity or atrophy. Skin: warm and dry, no rash. Neuro:  Strength and sensation are intact. Psych: Normal affect.  Accessory Clinical Findings    ECG personally reviewed by me today-no new tracings completed today  Lab Results  Component Value Date   WBC 10.9 (H) 06/28/2022   HGB 13.6 06/28/2022   HCT 42.0 06/28/2022   MCV 89.6 06/28/2022   PLT 331 06/28/2022   Lab Results  Component Value Date   CREATININE 0.88 09/18/2022   BUN 16 09/18/2022   NA 140 09/18/2022   K 4.3 09/18/2022   CL 106 09/18/2022   CO2 26 09/18/2022   Lab Results  Component Value Date   ALT 39 06/28/2022   AST 41 06/28/2022   ALKPHOS 69 06/28/2022   BILITOT 0.7 06/28/2022   Lab Results  Component Value Date   CHOL 133 12/22/2021   HDL 49 12/22/2021   LDLCALC 58 12/22/2021   LDLDIRECT 135.3 07/20/2014   TRIG 152 (H) 12/22/2021   CHOLHDL 2.7 12/22/2021    Lab Results  Component Value Date   HGBA1C 6.4 (H) 06/28/2022    Assessment & Plan   1.  Dyspnea on exertion which seems to be likely related to morbid obesity and deconditioning.  She has previously had an echocardiogram which revealed LVH of 60-65%, no regional wall motion abnormalities, G2 DD, and no valvular abnormalities.  She also had coronary CTA after having an abnormal EKG and precordial pain preoperatively that returned with a calcium score of 0 stating no coronary artery disease and to look for nonatherosclerotic causes of chest discomfort.  She only has dyspnea on exertion she has been encouraged to increase her activity by walking 5 to 10 minutes a couple times a day  on her treadmill to build her stamina back up after surgery and lack of activity and deconditioning is a  serious impact.  If she continues to have dyspnea on exertion on return we can consider pulmonary function studies.  2.  Essential hypertension with blood pressure 134/74 today.  She has better controlled and stable today with her pressure.  She has been continued on lisinopril 20 mg daily.  3.  Hyperlipidemia with an LDL in March 1958.  She is continued on her current dose of rosuvastatin 20 mg daily this continues to be followed up by her PCP.  4.  Disposition patient to return to clinic to see Dr. Herbie BaltimoreHarding in 3 months or sooner if needed.  Jaquitta Dupriest, NP 09/25/2022, 9:18 AM

## 2022-09-25 NOTE — Patient Instructions (Signed)
Medication Instructions:  No changes at this time.   *If you need a refill on your cardiac medications before your next appointment, please call your pharmacy*   Lab Work: None  If you have labs (blood work) drawn today and your tests are completely normal, you will receive your results only by: MyChart Message (if you have MyChart) OR A paper copy in the mail If you have any lab test that is abnormal or we need to change your treatment, we will call you to review the results.   Testing/Procedures: None   Follow-Up: At [facility], you and your health needs are our priority.  As part of our continuing mission to provide you with exceptional heart care, we have created designated Provider Care Teams.  These Care Teams include your primary Cardiologist (physician) and Advanced Practice Providers (APPs -  Physician Assistants and Nurse Practitioners) who all work together to provide you with the care you need, when you need it.   Your next appointment:   3 month(s)  The format for your next appointment:   In Person  Provider:   Bryan Lemma, MD or Charlsie Quest, NP        Important Information About Sugar

## 2022-09-30 ENCOUNTER — Encounter: Payer: Self-pay | Admitting: Internal Medicine

## 2022-09-30 DIAGNOSIS — J986 Disorders of diaphragm: Secondary | ICD-10-CM | POA: Insufficient documentation

## 2022-10-16 ENCOUNTER — Other Ambulatory Visit (INDEPENDENT_AMBULATORY_CARE_PROVIDER_SITE_OTHER): Payer: Managed Care, Other (non HMO)

## 2022-10-16 DIAGNOSIS — E7800 Pure hypercholesterolemia, unspecified: Secondary | ICD-10-CM

## 2022-10-16 DIAGNOSIS — R739 Hyperglycemia, unspecified: Secondary | ICD-10-CM

## 2022-10-16 DIAGNOSIS — I10 Essential (primary) hypertension: Secondary | ICD-10-CM

## 2022-10-16 NOTE — Addendum Note (Signed)
Addended by: Martine Bleecker S on: 10/16/2022 09:04 AM   Modules accepted: Orders  

## 2022-10-16 NOTE — Addendum Note (Signed)
Addended by: Iola Turri S on: 10/16/2022 09:04 AM   Modules accepted: Orders  

## 2022-10-16 NOTE — Addendum Note (Signed)
Addended by: Kinleigh Nault S on: 10/16/2022 09:04 AM   Modules accepted: Orders  

## 2022-10-16 NOTE — Addendum Note (Signed)
Addended by: Johney Perotti S on: 10/16/2022 09:04 AM   Modules accepted: Orders  

## 2022-10-16 NOTE — Addendum Note (Signed)
Addended by: Neta Ehlers on: 10/16/2022 09:04 AM   Modules accepted: Orders

## 2022-10-17 LAB — BASIC METABOLIC PANEL
BUN/Creatinine Ratio: 13 (ref 9–23)
BUN: 12 mg/dL (ref 6–24)
CO2: 24 mmol/L (ref 20–29)
Calcium: 9.6 mg/dL (ref 8.7–10.2)
Chloride: 102 mmol/L (ref 96–106)
Creatinine, Ser: 0.91 mg/dL (ref 0.57–1.00)
Glucose: 110 mg/dL — ABNORMAL HIGH (ref 70–99)
Potassium: 4.5 mmol/L (ref 3.5–5.2)
Sodium: 140 mmol/L (ref 134–144)
eGFR: 74 mL/min/{1.73_m2} (ref >59)

## 2022-10-17 LAB — CBC WITH DIFFERENTIAL/PLATELET
Basophils Absolute: 0 10*3/uL (ref 0.0–0.2)
Basos: 1 %
EOS (ABSOLUTE): 0.3 10*3/uL (ref 0.0–0.4)
Eos: 3 %
Hematocrit: 41.3 % (ref 34.0–46.6)
Hemoglobin: 13.4 g/dL (ref 11.1–15.9)
Immature Grans (Abs): 0 10*3/uL (ref 0.0–0.1)
Immature Granulocytes: 0 %
Lymphocytes Absolute: 2.6 10*3/uL (ref 0.7–3.1)
Lymphs: 32 %
MCH: 28.9 pg (ref 26.6–33.0)
MCHC: 32.4 g/dL (ref 31.5–35.7)
MCV: 89 fL (ref 79–97)
Monocytes Absolute: 0.5 10*3/uL (ref 0.1–0.9)
Monocytes: 6 %
Neutrophils Absolute: 4.8 10*3/uL (ref 1.4–7.0)
Neutrophils: 58 %
Platelets: 379 10*3/uL (ref 150–450)
RBC: 4.64 x10E6/uL (ref 3.77–5.28)
RDW: 12.9 % (ref 11.7–15.4)
WBC: 8.3 10*3/uL (ref 3.4–10.8)

## 2022-10-17 LAB — HEPATIC FUNCTION PANEL
ALT: 19 IU/L (ref 0–32)
AST: 18 IU/L (ref 0–40)
Albumin: 4.5 g/dL (ref 3.8–4.9)
Alkaline Phosphatase: 86 IU/L (ref 44–121)
Bilirubin Total: 0.5 mg/dL (ref 0.0–1.2)
Bilirubin, Direct: 0.16 mg/dL (ref 0.00–0.40)
Total Protein: 7.2 g/dL (ref 6.0–8.5)

## 2022-10-17 LAB — LIPID PANEL
Chol/HDL Ratio: 2.9 ratio (ref 0.0–4.4)
Cholesterol, Total: 142 mg/dL (ref 100–199)
HDL: 49 mg/dL (ref >39)
LDL Chol Calc (NIH): 63 mg/dL (ref 0–99)
Triglycerides: 178 mg/dL — ABNORMAL HIGH (ref 0–149)
VLDL Cholesterol Cal: 30 mg/dL (ref 5–40)

## 2022-10-17 LAB — HEMOGLOBIN A1C
Est. average glucose Bld gHb Est-mCnc: 137 mg/dL
Hgb A1c MFr Bld: 6.4 % — ABNORMAL HIGH (ref 4.8–5.6)

## 2022-10-19 ENCOUNTER — Ambulatory Visit (INDEPENDENT_AMBULATORY_CARE_PROVIDER_SITE_OTHER): Payer: Managed Care, Other (non HMO) | Admitting: Internal Medicine

## 2022-10-19 ENCOUNTER — Encounter: Payer: Self-pay | Admitting: Internal Medicine

## 2022-10-19 ENCOUNTER — Other Ambulatory Visit (HOSPITAL_COMMUNITY)
Admission: RE | Admit: 2022-10-19 | Discharge: 2022-10-19 | Disposition: A | Discharge status: Home / Self Care | Payer: Managed Care, Other (non HMO) | Source: Ambulatory Visit | Attending: Internal Medicine | Admitting: Internal Medicine

## 2022-10-19 VITALS — BP 136/80 | HR 95 | Temp 98.6°F | Resp 16 | Ht 64.0 in | Wt 234.2 lb

## 2022-10-19 DIAGNOSIS — F439 Reaction to severe stress, unspecified: Secondary | ICD-10-CM

## 2022-10-19 DIAGNOSIS — Z0000 Encounter for general adult medical examination without abnormal findings: Secondary | ICD-10-CM

## 2022-10-19 DIAGNOSIS — I10 Essential (primary) hypertension: Secondary | ICD-10-CM | POA: Diagnosis not present

## 2022-10-19 DIAGNOSIS — R739 Hyperglycemia, unspecified: Secondary | ICD-10-CM

## 2022-10-19 DIAGNOSIS — Z1231 Encounter for screening mammogram for malignant neoplasm of breast: Secondary | ICD-10-CM

## 2022-10-19 DIAGNOSIS — Z124 Encounter for screening for malignant neoplasm of cervix: Secondary | ICD-10-CM | POA: Diagnosis not present

## 2022-10-19 DIAGNOSIS — E7800 Pure hypercholesterolemia, unspecified: Secondary | ICD-10-CM

## 2022-10-19 DIAGNOSIS — D649 Anemia, unspecified: Secondary | ICD-10-CM

## 2022-10-19 NOTE — Progress Notes (Unsigned)
Subjective:    Patient ID: Kristen Aguilar, female    DOB: 04/28/1966, 57 y.o.   MRN: [id number]  Patient here for  Chief Complaint  Patient presents with   Annual Exam    CPE    HPI Here for a physical exam.  S/p TKR 07/06/22.  Doing well status post her knee surgery.  Saw cardiology 09/25/22.  Echocardiogram completed 12//23 revealed LVEF of 60-65%, no regional wall motion abnormalities, G2 DD, with no valvular abnormalities noted. Coronary CTA was completed 09/24/2022 which revealed a normal coronary calcium score of 0. Normal coronary origin with right dominance, no evidence of CAD, CAD-RADS 0.  Stays active around the house cleaning, etc.  No chest pain or shortness of breath reported.  No abdominal pain or cramping.  No bowel changes reported today.  Reviewed blood pressure checks (outside readings).  Most range 118-127/60-70s.     Past Medical History:  Diagnosis Date   Allergy    Anemia    as a child   Anxiety    Arthritis    Depression    Gestational diabetes    Hyperlipidemia due to dietary fat intake 04/25/2014   Hypertension    Hypothyroidism    Pneumonia    as a child   PONV (postoperative nausea and vomiting)    with c sections   Pre-diabetes    Past Surgical History:  Procedure Laterality Date   CESAREAN SECTION  2001 & 2004   COLONOSCOPY WITH PROPOFOL N/A 06/09/2018   Procedure: COLONOSCOPY WITH PROPOFOL;  Surgeon: Jonathon Bellows, MD;  Location: [facility] ENDOSCOPY;  Service: Gastroenterology;  Laterality: N/A;   KNEE ARTHROPLASTY Right 07/06/2022   Procedure: COMPUTER ASSISTED TOTAL KNEE ARTHROPLASTY;  Surgeon: Dereck Leep, MD;  Location: [facility] ORS;  Service: Orthopedics;  Laterality: Right;   Family History  Problem Relation Age of Onset   Arrhythmia Mother        A-FIB   Hypertension Mother    Atrial fibrillation Mother    Atrial fibrillation Father    Arrhythmia Father        A-FIB   Hypertension Father    Fibromyalgia Sister    Hypertension Maternal  Grandmother    Diabetes Maternal Grandmother    Breast cancer Paternal Grandmother    Social History   Socioeconomic History   Marital status: Married    Spouse name: Elenore Rota   Number of children: 2   Years of education: Not on file   Highest education level: Not on file  Occupational History   Not on file  Tobacco Use   Smoking status: Never   Smokeless tobacco: Never  Vaping Use   Vaping Use: Never used  Substance and Sexual Activity   Alcohol use: No    Alcohol/week: 0.0 standard drinks of alcohol   Drug use: No   Sexual activity: Not on file  Other Topics Concern   Not on file  Social History Narrative   Not on file   Social Determinants of Health   Financial Resource Strain: Not on file  Food Insecurity: No Food Insecurity (07/06/2022)   Hunger Vital Sign    Worried About Running Out of Food in the Last Year: Never true    Ran Out of Food in the Last Year: Never true  Transportation Needs: No Transportation Needs (07/06/2022)   PRAPARE - Hydrologist (Medical): No    Lack of Transportation (Non-Medical): No  Physical Activity: Not on file  Stress: Not on file  Social Connections: Not on file     Review of Systems     Objective:     BP 136/80   Pulse 95   Temp 98.6 F (37 C) (Temporal)   Resp 16   Ht 5\' 4"  (1.626 m)   Wt 234 lb 3.2 oz (106.2 kg)   LMP 10/09/2014   SpO2 97%   BMI 40.20 kg/m  Wt Readings from Last 3 Encounters:  10/19/22 234 lb 3.2 oz (106.2 kg)  09/25/22 234 lb 12.8 oz (106.5 kg)  08/30/22 238 lb (108 kg)    Physical Exam Vitals reviewed.  Constitutional:      General: She is not in acute distress.    Appearance: Normal appearance. She is well-developed.  HENT:     Head: Normocephalic and atraumatic.     Right Ear: External ear normal.     Left Ear: External ear normal.  Eyes:     General: No scleral icterus.       Right eye: No discharge.        Left eye: No discharge.      Conjunctiva/sclera: Conjunctivae normal.  Neck:     Thyroid: No thyromegaly.  Cardiovascular:     Rate and Rhythm: Normal rate and regular rhythm.  Pulmonary:     Effort: No tachypnea, accessory muscle usage or respiratory distress.     Breath sounds: Normal breath sounds. No decreased breath sounds or wheezing.  Chest:  Breasts:    Right: No inverted nipple, mass, nipple discharge or tenderness (no axillary adenopathy).     Left: No inverted nipple, mass, nipple discharge or tenderness (no axilarry adenopathy).  Abdominal:     General: Bowel sounds are normal.     Palpations: Abdomen is soft.     Tenderness: There is no abdominal tenderness.  Genitourinary:    Comments: Normal external genitalia.  Vaginal vault without lesions.  Cervix identified.  Pap smear performed.  Could not appreciate any adnexal masses or tenderness.   Musculoskeletal:        General: No swelling or tenderness.     Cervical back: Neck supple.  Lymphadenopathy:     Cervical: No cervical adenopathy.  Skin:    Findings: No erythema or rash.  Neurological:     Mental Status: She is alert and oriented to person, place, and time.  Psychiatric:        Mood and Affect: Mood normal.        Behavior: Behavior normal.      Outpatient Encounter Medications as of 10/19/2022  Medication Sig   acetaminophen (TYLENOL) 650 MG CR tablet Take 650 mg by mouth 2 (two) times daily.   cetirizine (ZYRTEC) 10 MG tablet Take 10 mg by mouth daily.   Cholecalciferol (VITAMIN D3 PO) Take 1,000 mcg by mouth daily.   citalopram (CELEXA) 20 MG tablet TAKE 1 AND 1/2 TABLETS BY  MOUTH DAILY   Coenzyme Q10 (CO Q 10) 100 MG CAPS Take 100 mg by mouth daily.   fish oil-omega-3 fatty acids 1000 MG capsule Take 2 g by mouth daily.   fluticasone (FLONASE) 50 MCG/ACT nasal spray USE 2 SPRAYS IN BOTH  NOSTRILS DAILY   Inulin (FIBER CHOICE PREBIOTIC FIBER) 1.5 g CHEW Chew 1 tablet by mouth daily. Takes 1/4 chew daily   levothyroxine  (SYNTHROID) 75 MCG tablet TAKE 1 TABLET BY MOUTH DAILY  BEFORE BREAKFAST   lisinopril (ZESTRIL) 20 MG tablet TAKE 1 TABLET BY MOUTH ONCE  DAILY   loperamide (IMODIUM A-D) 2 MG tablet Take 2 mg by mouth 4 (four) times daily as needed for diarrhea or loose stools.   Multiple Vitamin (MULTIVITAMIN) tablet Take 1 tablet by mouth daily.   OVER THE COUNTER MEDICATION Apply 1 Application topically as needed (knee pain). Emu cream blue rub topical   OVER THE COUNTER MEDICATION as needed. Equate- Childrens cough and cold syrup   rosuvastatin (CRESTOR) 20 MG tablet TAKE 1 TABLET BY MOUTH  DAILY   [DISCONTINUED] senna-docusate (SENOKOT-S) 8.6-50 MG tablet Take 1 tablet by mouth 2 (two) times daily. (Patient not taking: Reported on 09/25/2022)   No facility-administered encounter medications on file as of 10/19/2022.     Lab Results  Component Value Date   WBC 8.3 10/16/2022   HGB 13.4 10/16/2022   HCT 41.3 10/16/2022   PLT 379 10/16/2022   GLUCOSE 110 (H) 10/16/2022   CHOL 142 10/16/2022   TRIG 178 (H) 10/16/2022   HDL 49 10/16/2022   LDLDIRECT 135.3 07/20/2014   LDLCALC 63 10/16/2022   ALT 19 10/16/2022   AST 18 10/16/2022   NA 140 10/16/2022   K 4.5 10/16/2022   CL 102 10/16/2022   CREATININE 0.91 10/16/2022   BUN 12 10/16/2022   CO2 24 10/16/2022   TSH 2.820 08/14/2021   HGBA1C 6.4 (H) 10/16/2022    CT CORONARY MORPH W/CTA COR W/SCORE W/CA W/CM &/OR WO/CM  Addendum Date: 09/24/2022   ADDENDUM REPORT: 09/24/2022 16:17 CLINICAL DATA:  This over-read does not include interpretation of cardiac or coronary anatomy or pathology. The coronary CTA interpretation by the cardiologist is attached. COMPARISON:  None available. FINDINGS: Marked elevation of right hemidiaphragm noted, with right basilar atelectasis versus scarring. No suspicious nodules, masses, or infiltrates are identified in the visualized portion of the lungs. No pleural fluid seen. The visualized portions of the mediastinum and  chest wall are unremarkable. IMPRESSION: Elevated right hemidiaphragm, with right basilar atelectasis versus scarring. Electronically Signed   By: Marlaine Hind M.D.   On: 09/24/2022 16:17   Result Date: 09/24/2022 CLINICAL DATA:  Dyspnea on exertion EXAM: Cardiac/Coronary  CTA TECHNIQUE: The patient was scanned on a [employer] scanner. : A prospective scan was triggered in the descending thoracic aorta. Axial non-contrast 3 mm slices were carried out through the heart. The data set was analyzed on a dedicated work station and scored using the [place]. Gantry rotation speed was 66 msecs and collimation was .6 mm. 100mg  of metoprolol and 0.8 mg of sl NTG was given. The 3D data set was reconstructed in 5% intervals of the 60-95 % of the R-R cycle. Diastolic phases were analyzed on a dedicated work station using MPR, MIP and VRT modes. The patient received 80 cc of contrast. FINDINGS: Aorta:  Normal size.  No calcifications.  No dissection. Aortic Valve:  Trileaflet.  No calcifications. Coronary Arteries:  Normal coronary origin.  Right dominance. RCA is a dominant artery that gives rise to PDA and PLA. There is no plaque. Left main gives rise to LAD and LCX arteries. LAD has no plaque. LCX is a non-dominant artery.  There is no plaque. Other findings: Normal pulmonary vein drainage into the left atrium. Normal left atrial appendage without a thrombus. Normal size of the pulmonary artery. IMPRESSION: 1. Normal coronary calcium score of 0.  Patient is low risk. 2. Normal coronary origin with right dominance. 3. No evidence of CAD. 4. CAD-RADS 0. Consider non-atherosclerotic causes of chest pain. Electronically  Signed: By: Kate Sable M.D. On: 09/24/2022 13:41       Assessment & Plan:  Health care maintenance Assessment & Plan: Physical today 10/19/22.   PAP 06/18/19 - negative with negative HPV.  F/u pap today. Colonoscopy 05/2018.  Recommended f/u in 10 years.  Mammogram 07/10/21 - Birads I.     Screening for cervical cancer  Hyperglycemia  Primary hypertension  Hypercholesterolemia  Encounter for screening mammogram for malignant neoplasm of breast     Einar Pheasant, MD

## 2022-10-19 NOTE — Patient Instructions (Addendum)
Your mammogram has been scheduled!  January 8th , 9:30AM at :  [facility]  [place] [address], [place], [place] [address] Phone: [phone]

## 2022-10-19 NOTE — Assessment & Plan Note (Addendum)
Physical today 10/19/22.   PAP 06/18/19 - negative with negative HPV.  F/u pap today. Colonoscopy 05/2018.  Recommended f/u in 10 years.  Mammogram 07/10/21 - Birads I. Overdue.  Schedule.

## 2022-10-21 ENCOUNTER — Encounter: Payer: Self-pay | Admitting: Internal Medicine

## 2022-10-21 NOTE — Assessment & Plan Note (Signed)
Low carb diet and exercise.  Follow.  

## 2022-10-21 NOTE — Assessment & Plan Note (Signed)
Low cholesterol diet and exercise.  Follow lipid panel and liver function tests.  Continue crestor.  

## 2022-10-21 NOTE — Assessment & Plan Note (Signed)
Blood pressure as outlined.  Continue lisinopril.  Follow pressures.  Follow metabolic panel.   

## 2022-10-21 NOTE — Assessment & Plan Note (Signed)
Low carb diet and exercise.  Follow met b and a1c.  

## 2022-10-21 NOTE — Assessment & Plan Note (Signed)
On citalopram.  Continue.  Has buspar 5mg  bid prn.  Overall feels she is handling things relatively well.  Follow.

## 2022-10-21 NOTE — Assessment & Plan Note (Signed)
Follow cbc.  

## 2022-10-22 LAB — HM MAMMOGRAPHY

## 2022-10-23 LAB — CYTOLOGY - PAP
Comment: NEGATIVE
Diagnosis: NEGATIVE
High risk HPV: NEGATIVE

## 2022-12-20 ENCOUNTER — Encounter: Payer: Self-pay | Admitting: Cardiology

## 2022-12-20 ENCOUNTER — Ambulatory Visit: Payer: Managed Care, Other (non HMO) | Attending: Cardiology | Admitting: Cardiology

## 2022-12-20 VITALS — BP 138/70 | HR 77 | Ht 64.0 in | Wt 237.4 lb

## 2022-12-20 DIAGNOSIS — R072 Precordial pain: Secondary | ICD-10-CM | POA: Diagnosis not present

## 2022-12-20 DIAGNOSIS — R0609 Other forms of dyspnea: Secondary | ICD-10-CM

## 2022-12-20 DIAGNOSIS — R9431 Abnormal electrocardiogram [ECG] [EKG]: Secondary | ICD-10-CM | POA: Diagnosis not present

## 2022-12-20 DIAGNOSIS — I10 Essential (primary) hypertension: Secondary | ICD-10-CM | POA: Diagnosis not present

## 2022-12-20 DIAGNOSIS — J986 Disorders of diaphragm: Secondary | ICD-10-CM

## 2022-12-20 DIAGNOSIS — E7849 Other hyperlipidemia: Secondary | ICD-10-CM

## 2022-12-20 NOTE — Progress Notes (Signed)
Primary Care Provider: Einar Pheasant, [place] Cardiologist: Glenetta Hew, MD Electrophysiologist: None  Clinic Note: Chief Complaint  Patient presents with   Follow-up    3 month follow up visit. Patient states that she is doing good on today. Meds reviewed with patient.     ===================================  ASSESSMENT/PLAN   Problem List Items Addressed This Visit       Cardiology Problems   Hyperlipidemia due to dietary fat intake (Chronic)    Recent recheck of LDL showed stable at 63.  Seems to tolerating Crestor well.  Would hold off on titrating further especially based on Coronary Calcium Score 0.      Essential hypertension (Chronic)    Borderline pressure today on current dose of lisinopril. Will watch closely.  Defer management to PCP        Other   Precordial pain - Primary    Somewhat atypical not necessarily occurring very frequently.  Coronary CTA shows no evidence of CAD.      Relevant Orders   EKG 12-Lead (Completed)   Nonspecific abnormal electrocardiogram (ECG) (EKG) (Chronic)    Healthy normal echocardiogram.  Normal Coronary CTA.  Dyspnea not likely be cardiac related.         Relevant Orders   EKG 12-Lead (Completed)   Exertional dyspnea    Coronary CTA and echocardiogram essentially normal.  Other than mild diastolic function, likely related to being related to obesity and deconditioning.      Relevant Orders   EKG 12-Lead (Completed)   Elevated hemidiaphragm (Chronic)    New finding on chest CT.  I indicated that this would be something that hopefully her PCP will be following up with.  This could explain some dyspnea.  Not sure of the etiology.       ===================================  HPI:    Kristen Aguilar is a 57 y.o. female with a PMH below who presents today for 38-month follow-up to discuss test results, at the request of Einar Pheasant, MD.  Kristen Aguilar was last seen on September 25, 2022  by Tora Perches, NP to review results of echocardiogram and Coronary CTA.  I had seen her back on November 16 with complaints of his dyspnea and chest discomfort that may or may not have been exertional.  Had a borderline abnormal EKG T wave inversions in precordial leads.  During the interim visit in December she was doing well.  No chest discomfort but continues to note exertional dyspnea.  She had knee surgery done.  Was encouraged to increase activity walking 5 to 10 minutes couple times a day to build her stamina back up again.  BP was stable on lisinopril 20 mg.  Recent Hospitalizations: None  Reviewed  CV studies:    The following studies were reviewed today: (if available, images/films reviewed: From Epic Chart or Care Everywhere)  Echocardiogram 09/20/22: LVEF of 60-65%, no regional wall motion abnormalities, G2 DD (although with Normal LA size?) with no valvular abnormalities noted.  Coronary CTA 09/24/2022: Normal coronary calcium score of 0.  Normal coronary origin with right dominance, no evidence of CAD, CAD-RADS 0, consider nonatherosclerotic causes of chest pain.  Elevated R hemidiaphragm.   Interval History:   Kristen Aguilar returns here today really to get a update on the results of her studies.  She did not fully understand the initial explanation back in her December visit.  She not having any episodes of chest tightness or pressure with rest or  exertion.  She is doing yard work and try to gradually pick up her exercise as instructed. He does still have some exertional dyspnea, but is starting to get back into shape and things seem to be getting better more exercise she does the less dyspneic she is getting.  Now she really only gets dizzy if she is really overdoing it or pushing hard.  She knowledges to get some more exercise and watch her diet for weight loss.  She is gradually getting able to do more exercise after her knee surgery.  CV Review of Symptoms  (Summary):positive for - dyspnea on exertion, edema, and palpitations negative for - chest pain, irregular heartbeat, loss of consciousness, orthopnea, palpitations, paroxysmal nocturnal dyspnea, rapid heart rate, shortness of breath, or syncope or near-syncope or TIA/amaurosis fugax, claudication  REVIEWED OF SYSTEMS   Review of Systems  Constitutional:  Positive for weight loss ("Maybe a little "). Negative for malaise/fatigue (Energy level getting back to normal while she is able to recover from her surgery.).  HENT:  Negative for congestion.   Respiratory:  Positive for shortness of breath (Only exertional only she overdoes it). Negative for cough.   Cardiovascular:  Negative for claudication and leg swelling.  Gastrointestinal:  Negative for blood in stool and melena.  Genitourinary:  Negative for hematuria.  Musculoskeletal:  Positive for joint pain. Negative for back pain and neck pain.  Neurological:  Negative for dizziness, weakness and headaches.  Psychiatric/Behavioral:  Negative for depression and memory loss. The patient is nervous/anxious. The patient does not have insomnia.     I have reviewed and (if needed) personally updated the patient's problem list, medications, allergies, past medical and surgical history, social and family history.   PAST MEDICAL HISTORY   Past Medical History:  Diagnosis Date   Allergy    Anemia    as a child   Anxiety    Arthritis    Depression    Gestational diabetes    Hyperlipidemia due to dietary fat intake 04/25/2014   Hypertension    Hypothyroidism    Pneumonia    as a child   PONV (postoperative nausea and vomiting)    with c sections   Pre-diabetes     PAST SURGICAL HISTORY   Past Surgical History:  Procedure Laterality Date   CESAREAN SECTION  2001 & 2004   COLONOSCOPY WITH PROPOFOL N/A 06/09/2018   Procedure: COLONOSCOPY WITH PROPOFOL;  Surgeon: Jonathon Bellows, MD;  Location: [facility] ENDOSCOPY;  Service: Gastroenterology;   Laterality: N/A;   KNEE ARTHROPLASTY Right 07/06/2022   Procedure: COMPUTER ASSISTED TOTAL KNEE ARTHROPLASTY;  Surgeon: Dereck Leep, MD;  Location: [facility] ORS;  Service: Orthopedics;  Laterality: Right;    MEDICATIONS/ALLERGIES   Current Meds  Medication Sig   acetaminophen (TYLENOL) 650 MG CR tablet Take 650 mg by mouth 2 (two) times daily.   cetirizine (ZYRTEC) 10 MG tablet Take 10 mg by mouth daily.   Cholecalciferol (VITAMIN D3 PO) Take 1,000 mcg by mouth daily.   citalopram (CELEXA) 20 MG tablet TAKE 1 AND 1/2 TABLETS BY  MOUTH DAILY   Coenzyme Q10 (CO Q 10) 100 MG CAPS Take 100 mg by mouth daily.   fish oil-omega-3 fatty acids 1000 MG capsule Take 2 g by mouth daily.   fluticasone (FLONASE) 50 MCG/ACT nasal spray USE 2 SPRAYS IN BOTH  NOSTRILS DAILY   Inulin (FIBER CHOICE PREBIOTIC FIBER) 1.5 g CHEW Chew 1 tablet by mouth daily. Takes 1/4 chew daily  levothyroxine (SYNTHROID) 75 MCG tablet TAKE 1 TABLET BY MOUTH DAILY  BEFORE BREAKFAST   lisinopril (ZESTRIL) 20 MG tablet TAKE 1 TABLET BY MOUTH ONCE DAILY   loperamide (IMODIUM A-D) 2 MG tablet Take 2 mg by mouth 4 (four) times daily as needed for diarrhea or loose stools.   Multiple Vitamin (MULTIVITAMIN) tablet Take 1 tablet by mouth daily.   OVER THE COUNTER MEDICATION Apply 1 Application topically as needed (knee pain). Emu cream blue rub topical   OVER THE COUNTER MEDICATION as needed. Equate- Childrens cough and cold syrup   rosuvastatin (CRESTOR) 20 MG tablet TAKE 1 TABLET BY MOUTH  DAILY    Allergies  Allergen Reactions   Penicillins     IgE = 67 (WNL) on 06/28/2022 Tolerated cefazolin    SOCIAL HISTORY/FAMILY HISTORY   Reviewed in Epic:  Pertinent findings:  Social History   Tobacco Use   Smoking status: Never   Smokeless tobacco: Never  Vaping Use   Vaping Use: Never used  Substance Use Topics   Alcohol use: No    Alcohol/week: 0.0 standard drinks of alcohol   Drug use: No   Social History   Social  History Narrative   Not on file    OBJCTIVE -PE, EKG, labs   Wt Readings from Last 3 Encounters:  12/20/22 237 lb 6.4 oz (107.7 kg)  10/19/22 234 lb 3.2 oz (106.2 kg)  09/25/22 234 lb 12.8 oz (106.5 kg)   Physical Exam: Physical Exam Vitals reviewed.  Constitutional:      General: She is not in acute distress.    Appearance: Normal appearance. She is obese. She is not ill-appearing or toxic-appearing.  HENT:     Head: Normocephalic and atraumatic.  Cardiovascular:     Rate and Rhythm: Normal rate and regular rhythm.     Pulses: Normal pulses.     Heart sounds: Normal heart sounds. No murmur (I do not hear murmur) heard.    No friction rub. No gallop.  Pulmonary:     Effort: Pulmonary effort is normal. No respiratory distress.     Breath sounds: Normal breath sounds. No wheezing or rales.  Chest:     Chest wall: No tenderness.  Musculoskeletal:        General: No swelling. Normal range of motion.     Cervical back: Normal range of motion and neck supple. No rigidity.  Lymphadenopathy:     Cervical: No cervical adenopathy.  Skin:    General: Skin is warm.  Neurological:     General: No focal deficit present.     Mental Status: She is alert and oriented to person, place, and time.     Gait: Gait normal.  Psychiatric:        Mood and Affect: Mood normal.        Behavior: Behavior normal.        Thought Content: Thought content normal.        Judgment: Judgment normal.     Adult ECG Report  Rate: 77;  Rhythm: normal sinus rhythm and normal axis, intervals durations.  Borderline low.  Lateral T wave inversions, cannot exclude ischemia. ;   Narrative Interpretation: Stable  Recent Labs: Reviewed Lab Results  Component Value Date   CHOL 142 10/16/2022   HDL 49 10/16/2022   LDLCALC 63 10/16/2022   LDLDIRECT 135.3 07/20/2014   TRIG 178 (H) 10/16/2022   CHOLHDL 2.9 10/16/2022   Lab Results  Component Value Date   CREATININE 0.91  10/16/2022   BUN 12 10/16/2022    NA 140 10/16/2022   K 4.5 10/16/2022   CL 102 10/16/2022   CO2 24 10/16/2022      Latest Ref Rng & Units 10/16/2022    9:04 AM 06/28/2022    2:13 PM 05/09/2021    9:43 AM  CBC  WBC 3.4 - 10.8 x10E3/uL 8.3  10.9  9.4   Hemoglobin 11.1 - 15.9 g/dL 13.4  13.6  12.9   Hematocrit 34.0 - 46.6 % 41.3  42.0  38.3   Platelets 150 - 450 x10E3/uL 379  331  298     Lab Results  Component Value Date   HGBA1C 6.4 (H) 10/16/2022   Lab Results  Component Value Date   TSH 2.820 08/14/2021    ================================================== I spent a total of 73minutes with the patient spent in direct patient consultation.  Additional time spent with chart review  / charting (studies, outside notes, etc): 12 min Total Time: 36 min  Current medicines are reviewed at length with the patient today.  (+/- concerns) none  Notice: This dictation was prepared with Dragon dictation along with smart phrase technology. Any transcriptional errors that result from this process are unintentional and may not be corrected upon review.  Studies Ordered:  Orders Placed This Encounter  Procedures   EKG 12-Lead   No orders of the defined types were placed in this encounter.   Patient Instructions / Medication Changes & Studies & Tests Ordered   Patient Instructions  Medication Instructions:  Your physician recommends that you continue on your current medications as directed. Please refer to the Current Medication list given to you today.  *If you need a refill on your cardiac medications before your next appointment, please call your pharmacy*   Lab Work: -None ordered If you have labs (blood work) drawn today and your tests are completely normal, you will receive your results only by: [place] (if you have MyChart) OR A paper copy in the mail If you have any lab test that is abnormal or we need to change your treatment, we will call you to review the results.   Testing/Procedures: -None  ordered   Follow-Up: At [facility], you and your health needs are our priority.  As part of our continuing mission to provide you with exceptional heart care, we have created designated Provider Care Teams.  These Care Teams include your primary Cardiologist (physician) and Advanced Practice Providers (APPs -  Physician Assistants and Nurse Practitioners) who all work together to provide you with the care you need, when you need it.  We recommend signing up for the patient portal called "MyChart".  Sign up information is provided on this After Visit Summary.  MyChart is used to connect with patients for Virtual Visits (Telemedicine).  Patients are able to view lab/test results, encounter notes, upcoming appointments, etc.  Non-urgent messages can be sent to your provider as well.   To learn more about what you can do with MyChart, go to [email or web address].    Your next appointment:   Follow-up as needed  Provider:   You may see Glenetta Hew, MD or one of the following Advanced Practice Providers on your designated Care Team:   Murray Hodgkins, NP Christell Faith, PA-C Cadence Kathlen Mody, PA-C Gerrie Nordmann, NP   Other Instructions -None      Leonie Man, MD, MS Glenetta Hew, M.D., M.S. Interventional Cardiologist  [place] [facility]   [phone]  [address] [place], [place]  [address] [phone]           Fax [phone]    Thank you for choosing [facility] in [place]!!

## 2022-12-20 NOTE — Patient Instructions (Signed)
Medication Instructions:  Your physician recommends that you continue on your current medications as directed. Please refer to the Current Medication list given to you today.  *If you need a refill on your cardiac medications before your next appointment, please call your pharmacy*   Lab Work: -None ordered If you have labs (blood work) drawn today and your tests are completely normal, you will receive your results only by: [place] (if you have MyChart) OR A paper copy in the mail If you have any lab test that is abnormal or we need to change your treatment, we will call you to review the results.   Testing/Procedures: -None ordered   Follow-Up: At [facility], you and your health needs are our priority.  As part of our continuing mission to provide you with exceptional heart care, we have created designated Provider Care Teams.  These Care Teams include your primary Cardiologist (physician) and Advanced Practice Providers (APPs -  Physician Assistants and Nurse Practitioners) who all work together to provide you with the care you need, when you need it.  We recommend signing up for the patient portal called "MyChart".  Sign up information is provided on this After Visit Summary.  MyChart is used to connect with patients for Virtual Visits (Telemedicine).  Patients are able to view lab/test results, encounter notes, upcoming appointments, etc.  Non-urgent messages can be sent to your provider as well.   To learn more about what you can do with MyChart, go to [email or web address].    Your next appointment:   Follow-up as needed  Provider:   You may see Glenetta Hew, MD or one of the following Advanced Practice Providers on your designated Care Team:   Murray Hodgkins, NP Christell Faith, PA-C Cadence Kathlen Mody, PA-C Gerrie Nordmann, NP   Other Instructions -None

## 2022-12-29 ENCOUNTER — Encounter: Payer: Self-pay | Admitting: Cardiology

## 2022-12-29 NOTE — Assessment & Plan Note (Signed)
New finding on chest CT.  I indicated that this would be something that hopefully her PCP will be following up with.  This could explain some dyspnea.  Not sure of the etiology.

## 2022-12-29 NOTE — Assessment & Plan Note (Signed)
Recent recheck of LDL showed stable at 63.  Seems to tolerating Crestor well.  Would hold off on titrating further especially based on Coronary Calcium Score 0.

## 2022-12-29 NOTE — Assessment & Plan Note (Signed)
Borderline pressure today on current dose of lisinopril. Will watch closely.  Defer management to PCP

## 2022-12-29 NOTE — Assessment & Plan Note (Signed)
Somewhat atypical not necessarily occurring very frequently.  Coronary CTA shows no evidence of CAD.

## 2022-12-29 NOTE — Assessment & Plan Note (Signed)
Healthy normal echocardiogram.  Normal Coronary CTA.  Dyspnea not likely be cardiac related.

## 2022-12-29 NOTE — Assessment & Plan Note (Signed)
Coronary CTA and echocardiogram essentially normal.  Other than mild diastolic function, likely related to being related to obesity and deconditioning.

## 2023-02-15 ENCOUNTER — Other Ambulatory Visit (INDEPENDENT_AMBULATORY_CARE_PROVIDER_SITE_OTHER): Payer: Managed Care, Other (non HMO)

## 2023-02-15 DIAGNOSIS — E7800 Pure hypercholesterolemia, unspecified: Secondary | ICD-10-CM

## 2023-02-15 DIAGNOSIS — R739 Hyperglycemia, unspecified: Secondary | ICD-10-CM

## 2023-02-15 DIAGNOSIS — I10 Essential (primary) hypertension: Secondary | ICD-10-CM

## 2023-02-15 NOTE — Addendum Note (Signed)
Addended by: Clearnce Sorrel on: 02/15/2023 09:01 AM   Modules accepted: Orders

## 2023-02-15 NOTE — Addendum Note (Signed)
Addended by: Dyquan Minks S on: 02/15/2023 09:01 AM   Modules accepted: Orders  

## 2023-02-15 NOTE — Addendum Note (Signed)
Addended by: Madyson Lukach S on: 02/15/2023 09:01 AM   Modules accepted: Orders  

## 2023-02-15 NOTE — Addendum Note (Signed)
Addended by: Jereld Presti S on: 02/15/2023 09:01 AM   Modules accepted: Orders  

## 2023-02-15 NOTE — Addendum Note (Signed)
Addended by: Ji Fairburn S on: 02/15/2023 09:01 AM   Modules accepted: Orders  

## 2023-02-16 LAB — BASIC METABOLIC PANEL
BUN/Creatinine Ratio: 16 (ref 9–23)
BUN: 14 mg/dL (ref 6–24)
CO2: 21 mmol/L (ref 20–29)
Calcium: 9.4 mg/dL (ref 8.7–10.2)
Chloride: 102 mmol/L (ref 96–106)
Creatinine, Ser: 0.88 mg/dL (ref 0.57–1.00)
Glucose: 110 mg/dL — ABNORMAL HIGH (ref 70–99)
Potassium: 5 mmol/L (ref 3.5–5.2)
Sodium: 139 mmol/L (ref 134–144)
eGFR: 76 mL/min/{1.73_m2} (ref >59)

## 2023-02-16 LAB — HEPATIC FUNCTION PANEL
ALT: 25 IU/L (ref 0–32)
AST: 24 IU/L (ref 0–40)
Albumin: 4.3 g/dL (ref 3.8–4.9)
Alkaline Phosphatase: 81 IU/L (ref 44–121)
Bilirubin Total: 0.4 mg/dL (ref 0.0–1.2)
Bilirubin, Direct: 0.1 mg/dL (ref 0.00–0.40)
Total Protein: 6.9 g/dL (ref 6.0–8.5)

## 2023-02-16 LAB — LIPID PANEL
Chol/HDL Ratio: 2.6 ratio (ref 0.0–4.4)
Cholesterol, Total: 134 mg/dL (ref 100–199)
HDL: 52 mg/dL (ref >39)
LDL Chol Calc (NIH): 57 mg/dL (ref 0–99)
Triglycerides: 144 mg/dL (ref 0–149)
VLDL Cholesterol Cal: 25 mg/dL (ref 5–40)

## 2023-02-16 LAB — HEMOGLOBIN A1C
Est. average glucose Bld gHb Est-mCnc: 146 mg/dL
Hgb A1c MFr Bld: 6.7 % — ABNORMAL HIGH (ref 4.8–5.6)

## 2023-02-17 LAB — MICROALBUMIN / CREATININE URINE RATIO
Creatinine, Urine: 133.6 mg/dL
Microalb/Creat Ratio: 23 mg/g creat (ref 0–29)
Microalbumin, Urine: 30.6 ug/mL

## 2023-02-19 ENCOUNTER — Ambulatory Visit: Payer: Managed Care, Other (non HMO) | Admitting: Internal Medicine

## 2023-02-19 VITALS — BP 128/72 | HR 82 | Temp 98.2°F | Resp 16 | Ht 64.0 in | Wt 240.0 lb

## 2023-02-19 DIAGNOSIS — J986 Disorders of diaphragm: Secondary | ICD-10-CM

## 2023-02-19 DIAGNOSIS — R739 Hyperglycemia, unspecified: Secondary | ICD-10-CM

## 2023-02-19 DIAGNOSIS — Z6841 Body Mass Index (BMI) 40.0 and over, adult: Secondary | ICD-10-CM

## 2023-02-19 DIAGNOSIS — Z96659 Presence of unspecified artificial knee joint: Secondary | ICD-10-CM

## 2023-02-19 DIAGNOSIS — E875 Hyperkalemia: Secondary | ICD-10-CM | POA: Diagnosis not present

## 2023-02-19 DIAGNOSIS — D649 Anemia, unspecified: Secondary | ICD-10-CM | POA: Diagnosis not present

## 2023-02-19 DIAGNOSIS — E7800 Pure hypercholesterolemia, unspecified: Secondary | ICD-10-CM

## 2023-02-19 DIAGNOSIS — R0609 Other forms of dyspnea: Secondary | ICD-10-CM

## 2023-02-19 DIAGNOSIS — Z9109 Other allergy status, other than to drugs and biological substances: Secondary | ICD-10-CM

## 2023-02-19 DIAGNOSIS — I10 Essential (primary) hypertension: Secondary | ICD-10-CM

## 2023-02-19 DIAGNOSIS — H9313 Tinnitus, bilateral: Secondary | ICD-10-CM

## 2023-02-19 LAB — POTASSIUM: Potassium: 5 mEq/L (ref 3.5–5.1)

## 2023-02-19 NOTE — Progress Notes (Signed)
Subjective:    Patient ID: AAFIYA Aguilar, female    DOB: 1966/02/07, 57 y.o.   MRN: [id number]  Patient here for  Chief Complaint  Patient presents with   Medical Management of Chronic Issues    HPI Here for a scheduled follow up.   S/p TKR 07/06/22.  Doing well status post her knee surgery.  Saw cardiology 09/25/22.  Echocardiogram completed 12//23 revealed LVEF of 60-65%, no regional wall motion abnormalities, G2 DD, with no valvular abnormalities noted. Coronary CTA was completed 09/24/2022 which revealed a normal coronary calcium score of 0. Normal coronary origin with right dominance, no evidence of CAD, CAD-RADS 0.  No chest pain.  Some sob with exertion, but reports is unchanged.  She feels is related to her weight.  Discussed elevated hemi diaphragm on recent CT.  Declines further evaluation right now.  No acid reflux. No abdominal pain or bowel change.  Does report persistent ringing in her ears.  Zyrtec and flonase controls allergy symptoms.     Past Medical History:  Diagnosis Date   Allergy    Anemia    as a child   Anxiety    Arthritis    Depression    Gestational diabetes    Hyperlipidemia due to dietary fat intake 04/25/2014   Hypertension    Hypothyroidism    Pneumonia    as a child   PONV (postoperative nausea and vomiting)    with c sections   Pre-diabetes    Past Surgical History:  Procedure Laterality Date   CESAREAN SECTION  2001 & 2004   COLONOSCOPY WITH PROPOFOL N/A 06/09/2018   Procedure: COLONOSCOPY WITH PROPOFOL;  Surgeon: Wyline Mood, MD;  Location: [facility] ENDOSCOPY;  Service: Gastroenterology;  Laterality: N/A;   KNEE ARTHROPLASTY Right 07/06/2022   Procedure: COMPUTER ASSISTED TOTAL KNEE ARTHROPLASTY;  Surgeon: Donato Heinz, MD;  Location: [facility] ORS;  Service: Orthopedics;  Laterality: Right;   Family History  Problem Relation Age of Onset   Arrhythmia Mother        A-FIB   Hypertension Mother    Atrial fibrillation Mother    Atrial  fibrillation Father    Arrhythmia Father        A-FIB   Hypertension Father    Fibromyalgia Sister    Hypertension Maternal Grandmother    Diabetes Maternal Grandmother    Breast cancer Paternal Grandmother    Social History   Socioeconomic History   Marital status: Married    Spouse name: Dorinda Hill   Number of children: 2   Years of education: Not on file   Highest education level: Not on file  Occupational History   Not on file  Tobacco Use   Smoking status: Never   Smokeless tobacco: Never  Vaping Use   Vaping Use: Never used  Substance and Sexual Activity   Alcohol use: No    Alcohol/week: 0.0 standard drinks of alcohol   Drug use: No   Sexual activity: Not on file  Other Topics Concern   Not on file  Social History Narrative   Not on file   Social Determinants of Health   Financial Resource Strain: Not on file  Food Insecurity: No Food Insecurity (07/06/2022)   Hunger Vital Sign    Worried About Running Out of Food in the Last Year: Never true    Ran Out of Food in the Last Year: Never true  Transportation Needs: No Transportation Needs (07/06/2022)   PRAPARE - Transportation  Lack of Transportation (Medical): No    Lack of Transportation (Non-Medical): No  Physical Activity: Not on file  Stress: Not on file  Social Connections: Not on file     Review of Systems  Constitutional:  Negative for appetite change and unexpected weight change.  HENT:  Positive for tinnitus. Negative for congestion and sinus pressure.   Respiratory:  Negative for cough and chest tightness.        Some sob as outlined.   Cardiovascular:  Negative for chest pain and palpitations.       No increased swelling.   Gastrointestinal:  Negative for abdominal pain, diarrhea, nausea and vomiting.  Genitourinary:  Negative for difficulty urinating and dysuria.  Musculoskeletal:  Negative for joint swelling and myalgias.  Skin:  Negative for color change and rash.  Neurological:  Negative  for dizziness and headaches.  Psychiatric/Behavioral:  Negative for agitation and dysphoric mood.        Objective:     BP 128/72   Pulse 82   Temp 98.2 F (36.8 C)   Resp 16   Ht 5\' 4"  (1.626 m)   Wt 240 lb (108.9 kg)   LMP 10/09/2014   SpO2 98%   BMI 41.20 kg/m  Wt Readings from Last 3 Encounters:  02/19/23 240 lb (108.9 kg)  12/20/22 237 lb 6.4 oz (107.7 kg)  10/19/22 234 lb 3.2 oz (106.2 kg)    Physical Exam Vitals reviewed.  Constitutional:      General: She is not in acute distress.    Appearance: Normal appearance.  HENT:     Head: Normocephalic and atraumatic.     Right Ear: External ear normal.     Left Ear: External ear normal.  Eyes:     General: No scleral icterus.       Right eye: No discharge.        Left eye: No discharge.     Conjunctiva/sclera: Conjunctivae normal.  Neck:     Thyroid: No thyromegaly.  Cardiovascular:     Rate and Rhythm: Normal rate and regular rhythm.  Pulmonary:     Effort: No respiratory distress.     Breath sounds: Normal breath sounds. No wheezing.  Abdominal:     General: Bowel sounds are normal.     Palpations: Abdomen is soft.     Tenderness: There is no abdominal tenderness.  Musculoskeletal:        General: No swelling or tenderness.     Cervical back: Neck supple. No tenderness.  Lymphadenopathy:     Cervical: No cervical adenopathy.  Skin:    Findings: No erythema or rash.  Neurological:     Mental Status: She is alert.  Psychiatric:        Mood and Affect: Mood normal.        Behavior: Behavior normal.      Outpatient Encounter Medications as of 02/19/2023  Medication Sig   acetaminophen (TYLENOL) 650 MG CR tablet Take 650 mg by mouth 2 (two) times daily.   cetirizine (ZYRTEC) 10 MG tablet Take 10 mg by mouth daily.   Cholecalciferol (VITAMIN D3 PO) Take 1,000 mcg by mouth daily.   citalopram (CELEXA) 20 MG tablet TAKE 1 AND 1/2 TABLETS BY  MOUTH DAILY   Coenzyme Q10 (CO Q 10) 100 MG CAPS Take 100 mg  by mouth daily.   fish oil-omega-3 fatty acids 1000 MG capsule Take 2 g by mouth daily.   fluticasone (FLONASE) 50 MCG/ACT nasal spray USE  2 SPRAYS IN BOTH  NOSTRILS DAILY   Inulin (FIBER CHOICE PREBIOTIC FIBER) 1.5 g CHEW Chew 1 tablet by mouth daily. Takes 1/4 chew daily   levothyroxine (SYNTHROID) 75 MCG tablet TAKE 1 TABLET BY MOUTH DAILY  BEFORE BREAKFAST   lisinopril (ZESTRIL) 20 MG tablet TAKE 1 TABLET BY MOUTH ONCE DAILY   loperamide (IMODIUM A-D) 2 MG tablet Take 2 mg by mouth 4 (four) times daily as needed for diarrhea or loose stools.   Multiple Vitamin (MULTIVITAMIN) tablet Take 1 tablet by mouth daily.   OVER THE COUNTER MEDICATION Apply 1 Application topically as needed (knee pain). Emu cream blue rub topical   OVER THE COUNTER MEDICATION as needed. Equate- Childrens cough and cold syrup   rosuvastatin (CRESTOR) 20 MG tablet TAKE 1 TABLET BY MOUTH  DAILY   No facility-administered encounter medications on file as of 02/19/2023.     Lab Results  Component Value Date   WBC 8.3 10/16/2022   HGB 13.4 10/16/2022   HCT 41.3 10/16/2022   PLT 379 10/16/2022   GLUCOSE 110 (H) 02/15/2023   CHOL 134 02/15/2023   TRIG 144 02/15/2023   HDL 52 02/15/2023   LDLDIRECT 135.3 07/20/2014   LDLCALC 57 02/15/2023   ALT 25 02/15/2023   AST 24 02/15/2023   NA 139 02/15/2023   K 4.3 03/08/2023   CL 102 02/15/2023   CREATININE 0.88 02/15/2023   BUN 14 02/15/2023   CO2 21 02/15/2023   TSH 2.820 08/14/2021   HGBA1C 6.7 (H) 02/15/2023    No results found.     Assessment & Plan:  Tinnitus of both ears Assessment & Plan: Referred to ENT for further evaluation.   Orders: -     Ambulatory referral to ENT  Hyperkalemia -     Potassium  Anemia, unspecified type Assessment & Plan: Follow cbc.    Elevated hemidiaphragm Assessment & Plan: New finding on CT chest (calcium score).  Discussed further w/up today.  She wants to hold on any further w/up at this time.  Follow.     Environmental allergies Assessment & Plan: Controls with zyrtec and flonase.    Essential hypertension Assessment & Plan: Blood pressure as outlined.  Continue lisinopril.  Follow pressures.  Follow metabolic panel.   Orders: -     Basic metabolic panel; Future  Exertional dyspnea Assessment & Plan: Saw cardiology 09/25/22.  Echocardiogram completed 12//23 revealed LVEF of 60-65%, no regional wall motion abnormalities, G2 DD, with no valvular abnormalities noted. Coronary CTA was completed 09/24/2022 which revealed a normal coronary calcium score of 0. Normal coronary origin with right dominance, no evidence of CAD, CAD-RADS 0.  No chest pain.  Some sob with exertion, but reports is unchanged.  She feels is related to her weight.    Hypercholesterolemia Assessment & Plan: Low cholesterol diet and exercise.  Follow lipid panel and liver function tests.  Continue crestor.   Orders: -     TSH; Future -     Lipid panel; Future -     Hepatic function panel; Future  Hyperglycemia Assessment & Plan: Low carb diet and exercise.  Follow met b and a1c.   Orders: -     Hemoglobin A1c; Future  Severe obesity (HCC) Assessment & Plan: BMI 41. Low carb diet and exercise.  Follow.    Status post total knee replacement, unspecified laterality Assessment & Plan: S/p TKR 07/06/22.  Doing well status post her knee surgery.  Dale , MD

## 2023-02-20 ENCOUNTER — Other Ambulatory Visit: Payer: Self-pay

## 2023-02-20 DIAGNOSIS — E875 Hyperkalemia: Secondary | ICD-10-CM

## 2023-03-01 ENCOUNTER — Encounter: Payer: Self-pay | Admitting: Internal Medicine

## 2023-03-01 DIAGNOSIS — H9319 Tinnitus, unspecified ear: Secondary | ICD-10-CM | POA: Insufficient documentation

## 2023-03-08 ENCOUNTER — Other Ambulatory Visit (INDEPENDENT_AMBULATORY_CARE_PROVIDER_SITE_OTHER): Payer: Managed Care, Other (non HMO)

## 2023-03-08 DIAGNOSIS — E875 Hyperkalemia: Secondary | ICD-10-CM | POA: Diagnosis not present

## 2023-03-08 LAB — POTASSIUM: Potassium: 4.3 mEq/L (ref 3.5–5.1)

## 2023-03-10 ENCOUNTER — Encounter: Payer: Self-pay | Admitting: Internal Medicine

## 2023-03-10 NOTE — Assessment & Plan Note (Signed)
Follow cbc.  

## 2023-03-10 NOTE — Assessment & Plan Note (Signed)
Saw cardiology 09/25/22.  Echocardiogram completed 12//23 revealed LVEF of 60-65%, no regional wall motion abnormalities, G2 DD, with no valvular abnormalities noted. Coronary CTA was completed 09/24/2022 which revealed a normal coronary calcium score of 0. Normal coronary origin with right dominance, no evidence of CAD, CAD-RADS 0.  No chest pain.  Some sob with exertion, but reports is unchanged.  She feels is related to her weight.

## 2023-03-10 NOTE — Assessment & Plan Note (Signed)
Controls with zyrtec and flonase.

## 2023-03-10 NOTE — Assessment & Plan Note (Signed)
New finding on CT chest (calcium score).  Discussed further w/up today.  She wants to hold on any further w/up at this time.  Follow.

## 2023-03-10 NOTE — Assessment & Plan Note (Signed)
Blood pressure as outlined.  Continue lisinopril.  Follow pressures.  Follow metabolic panel.   

## 2023-03-10 NOTE — Assessment & Plan Note (Signed)
S/p TKR 07/06/22.  Doing well status post her knee surgery.

## 2023-03-10 NOTE — Assessment & Plan Note (Signed)
Low carb diet and exercise.  Follow met b and a1c.   

## 2023-03-10 NOTE — Assessment & Plan Note (Signed)
BMI 41. Low carb diet and exercise.  Follow.

## 2023-03-10 NOTE — Assessment & Plan Note (Signed)
Low cholesterol diet and exercise.  Follow lipid panel and liver function tests.  Continue crestor.  

## 2023-03-10 NOTE — Assessment & Plan Note (Signed)
Referred to ENT for further evaluation. 

## 2023-04-26 ENCOUNTER — Other Ambulatory Visit: Payer: Self-pay | Admitting: Internal Medicine

## 2023-05-30 ENCOUNTER — Encounter (INDEPENDENT_AMBULATORY_CARE_PROVIDER_SITE_OTHER): Payer: Self-pay

## 2023-06-08 ENCOUNTER — Other Ambulatory Visit: Payer: Self-pay | Admitting: Internal Medicine

## 2023-06-24 ENCOUNTER — Other Ambulatory Visit (INDEPENDENT_AMBULATORY_CARE_PROVIDER_SITE_OTHER): Payer: Managed Care, Other (non HMO)

## 2023-06-24 DIAGNOSIS — E7800 Pure hypercholesterolemia, unspecified: Secondary | ICD-10-CM

## 2023-06-24 DIAGNOSIS — R739 Hyperglycemia, unspecified: Secondary | ICD-10-CM

## 2023-06-24 DIAGNOSIS — Z1231 Encounter for screening mammogram for malignant neoplasm of breast: Secondary | ICD-10-CM

## 2023-06-24 DIAGNOSIS — I10 Essential (primary) hypertension: Secondary | ICD-10-CM

## 2023-06-24 NOTE — Addendum Note (Signed)
Addended by: Warden Fillers on: 06/24/2023 09:05 AM   Modules accepted: Orders

## 2023-06-25 LAB — LIPID PANEL
Chol/HDL Ratio: 2.9 ratio (ref 0.0–4.4)
Cholesterol, Total: 132 mg/dL (ref 100–199)
HDL: 46 mg/dL (ref >39)
LDL Chol Calc (NIH): 53 mg/dL (ref 0–99)
Triglycerides: 206 mg/dL — ABNORMAL HIGH (ref 0–149)
VLDL Cholesterol Cal: 33 mg/dL (ref 5–40)

## 2023-06-25 LAB — BASIC METABOLIC PANEL
BUN/Creatinine Ratio: 15 (ref 9–23)
BUN: 13 mg/dL (ref 6–24)
CO2: 23 mmol/L (ref 20–29)
Calcium: 9.7 mg/dL (ref 8.7–10.2)
Chloride: 102 mmol/L (ref 96–106)
Creatinine, Ser: 0.86 mg/dL (ref 0.57–1.00)
Glucose: 113 mg/dL — ABNORMAL HIGH (ref 70–99)
Potassium: 4.7 mmol/L (ref 3.5–5.2)
Sodium: 140 mmol/L (ref 134–144)
eGFR: 78 mL/min/{1.73_m2} (ref >59)

## 2023-06-25 LAB — HEMOGLOBIN A1C
Est. average glucose Bld gHb Est-mCnc: 146 mg/dL
Hgb A1c MFr Bld: 6.7 % — ABNORMAL HIGH (ref 4.8–5.6)

## 2023-06-25 LAB — HEPATIC FUNCTION PANEL
ALT: 27 IU/L (ref 0–32)
AST: 22 IU/L (ref 0–40)
Albumin: 4.3 g/dL (ref 3.8–4.9)
Alkaline Phosphatase: 87 IU/L (ref 44–121)
Bilirubin Total: 0.6 mg/dL (ref 0.0–1.2)
Bilirubin, Direct: 0.17 mg/dL (ref 0.00–0.40)
Total Protein: 6.5 g/dL (ref 6.0–8.5)

## 2023-06-25 LAB — TSH: TSH: 3.41 u[IU]/mL (ref 0.450–4.500)

## 2023-06-28 ENCOUNTER — Encounter: Payer: Self-pay | Admitting: Internal Medicine

## 2023-06-28 ENCOUNTER — Ambulatory Visit: Payer: Managed Care, Other (non HMO) | Admitting: Internal Medicine

## 2023-06-28 VITALS — BP 128/70 | HR 89 | Temp 98.6°F | Ht 64.0 in | Wt 243.6 lb

## 2023-06-28 DIAGNOSIS — E7800 Pure hypercholesterolemia, unspecified: Secondary | ICD-10-CM

## 2023-06-28 DIAGNOSIS — I10 Essential (primary) hypertension: Secondary | ICD-10-CM | POA: Diagnosis not present

## 2023-06-28 DIAGNOSIS — H9319 Tinnitus, unspecified ear: Secondary | ICD-10-CM

## 2023-06-28 DIAGNOSIS — D649 Anemia, unspecified: Secondary | ICD-10-CM

## 2023-06-28 DIAGNOSIS — R0609 Other forms of dyspnea: Secondary | ICD-10-CM

## 2023-06-28 DIAGNOSIS — Z23 Encounter for immunization: Secondary | ICD-10-CM | POA: Diagnosis not present

## 2023-06-28 DIAGNOSIS — R739 Hyperglycemia, unspecified: Secondary | ICD-10-CM | POA: Diagnosis not present

## 2023-06-28 DIAGNOSIS — F439 Reaction to severe stress, unspecified: Secondary | ICD-10-CM

## 2023-06-28 DIAGNOSIS — E7849 Other hyperlipidemia: Secondary | ICD-10-CM

## 2023-06-28 MED ORDER — LEVOTHYROXINE SODIUM 75 MCG PO TABS: ORAL_TABLET | ORAL | 3 refills | Status: DC

## 2023-06-28 MED ORDER — FLUTICASONE PROPIONATE 50 MCG/ACT NA SUSP: NASAL | 3 refills | Status: AC

## 2023-06-28 MED ORDER — TIRZEPATIDE 2.5 MG/0.5ML ~~LOC~~ SOAJ: 2.5000 mg | SUBCUTANEOUS | 2 refills | Status: DC

## 2023-06-28 MED ORDER — CITALOPRAM HYDROBROMIDE 20 MG PO TABS: ORAL_TABLET | ORAL | 3 refills | Status: DC

## 2023-06-28 NOTE — Progress Notes (Unsigned)
Subjective:    Patient ID: Kristen Aguilar, female    DOB: 08-Dec-1965, 57 y.o.   MRN: [id number]  Patient here for  Chief Complaint  Patient presents with   Medication Management    HPI Here for a scheduled follow up.   S/p TKR 07/06/22.  Doing well status post her knee surgery.  Saw cardiology 09/25/22.  Echocardiogram completed 12//23 revealed LVEF of 60-65%, no regional wall motion abnormalities, G2 DD, with no valvular abnormalities noted. Coronary CTA was completed 09/24/2022 which revealed a normal coronary calcium score of 0. Normal coronary origin with right dominance, no evidence of CAD, CAD-RADS 0.  Was referred to ENT last visit - tinnitus. Evaluated by ENT - very mild hearing loss.  Elected to monitor. She had questions about weight loss and GLP1 agonist.  Has been trying to watch her diet.  Discussed diet and exercise.  Discussed GLP 1 agonist.  No abdominal pain.  No dysphagia.  Bowels stable.    Past Medical History:  Diagnosis Date   Allergy    Anemia    as a child   Anxiety    Arthritis    Depression    Gestational diabetes    Hyperlipidemia due to dietary fat intake 04/25/2014   Hypertension    Hypothyroidism    Pneumonia    as a child   PONV (postoperative nausea and vomiting)    with c sections   Pre-diabetes    Past Surgical History:  Procedure Laterality Date   CESAREAN SECTION  2001 & 2004   COLONOSCOPY WITH PROPOFOL N/A 06/09/2018   Procedure: COLONOSCOPY WITH PROPOFOL;  Surgeon: Wyline Mood, MD;  Location: [facility] ENDOSCOPY;  Service: Gastroenterology;  Laterality: N/A;   KNEE ARTHROPLASTY Right 07/06/2022   Procedure: COMPUTER ASSISTED TOTAL KNEE ARTHROPLASTY;  Surgeon: Donato Heinz, MD;  Location: [facility] ORS;  Service: Orthopedics;  Laterality: Right;   Family History  Problem Relation Age of Onset   Arrhythmia Mother        A-FIB   Hypertension Mother    Atrial fibrillation Mother    Atrial fibrillation Father    Arrhythmia Father        A-FIB    Hypertension Father    Fibromyalgia Sister    Hypertension Maternal Grandmother    Diabetes Maternal Grandmother    Breast cancer Paternal Grandmother    Social History   Socioeconomic History   Marital status: Married    Spouse name: Dorinda Hill   Number of children: 2   Years of education: Not on file   Highest education level: Not on file  Occupational History   Not on file  Tobacco Use   Smoking status: Never   Smokeless tobacco: Never  Vaping Use   Vaping status: Never Used  Substance and Sexual Activity   Alcohol use: No    Alcohol/week: 0.0 standard drinks of alcohol   Drug use: No   Sexual activity: Not on file  Other Topics Concern   Not on file  Social History Narrative   Not on file   Social Determinants of Health   Financial Resource Strain: Not on file  Food Insecurity: No Food Insecurity (07/06/2022)   Hunger Vital Sign    Worried About Running Out of Food in the Last Year: Never true    Ran Out of Food in the Last Year: Never true  Transportation Needs: No Transportation Needs (07/06/2022)   PRAPARE - Administrator, Civil Service (Medical): No  Lack of Transportation (Non-Medical): No  Physical Activity: Not on file  Stress: Not on file  Social Connections: Not on file     Review of Systems  Constitutional:  Negative for appetite change and unexpected weight change.  HENT:  Negative for congestion and sinus pressure.   Respiratory:  Negative for cough, chest tightness and shortness of breath.   Cardiovascular:  Negative for chest pain and palpitations.  Gastrointestinal:  Negative for abdominal pain, diarrhea, nausea and vomiting.  Genitourinary:  Negative for difficulty urinating and dysuria.  Musculoskeletal:  Negative for joint swelling and myalgias.  Skin:  Negative for color change and rash.  Neurological:  Negative for dizziness and headaches.  Psychiatric/Behavioral:  Negative for agitation and dysphoric mood.         Objective:     BP 128/70   Pulse 89   Temp 98.6 F (37 C) (Oral)   Ht 5\' 4"  (1.626 m)   Wt 243 lb 9.6 oz (110.5 kg)   LMP 10/09/2014   SpO2 96%   BMI 41.81 kg/m  Wt Readings from Last 3 Encounters:  06/28/23 243 lb 9.6 oz (110.5 kg)  02/19/23 240 lb (108.9 kg)  12/20/22 237 lb 6.4 oz (107.7 kg)    Physical Exam Vitals reviewed.  Constitutional:      General: She is not in acute distress.    Appearance: Normal appearance.  HENT:     Head: Normocephalic and atraumatic.     Right Ear: External ear normal.     Left Ear: External ear normal.  Eyes:     General: No scleral icterus.       Right eye: No discharge.        Left eye: No discharge.     Conjunctiva/sclera: Conjunctivae normal.  Neck:     Thyroid: No thyromegaly.  Cardiovascular:     Rate and Rhythm: Normal rate and regular rhythm.  Pulmonary:     Effort: No respiratory distress.     Breath sounds: Normal breath sounds. No wheezing.  Abdominal:     General: Bowel sounds are normal.     Palpations: Abdomen is soft.     Tenderness: There is no abdominal tenderness.  Musculoskeletal:        General: No swelling or tenderness.     Cervical back: Neck supple. No tenderness.  Lymphadenopathy:     Cervical: No cervical adenopathy.  Skin:    Findings: No erythema or rash.  Neurological:     Mental Status: She is alert.  Psychiatric:        Mood and Affect: Mood normal.        Behavior: Behavior normal.      Outpatient Encounter Medications as of 06/28/2023  Medication Sig   tirzepatide (MOUNJARO) 2.5 MG/0.5ML Pen Inject 2.5 mg into the skin once a week.   acetaminophen (TYLENOL) 650 MG CR tablet Take 650 mg by mouth 2 (two) times daily.   cetirizine (ZYRTEC) 10 MG tablet Take 10 mg by mouth daily.   Cholecalciferol (VITAMIN D3 PO) Take 1,000 mcg by mouth daily.   citalopram (CELEXA) 20 MG tablet TAKE 1 AND 1/2 TABLETS BY  MOUTH DAILY   Coenzyme Q10 (CO Q 10) 100 MG CAPS Take 100 mg by mouth daily.    fish oil-omega-3 fatty acids 1000 MG capsule Take 2 g by mouth daily.   fluticasone (FLONASE) 50 MCG/ACT nasal spray USE 2 SPRAYS IN BOTH  NOSTRILS DAILY   Inulin (FIBER CHOICE PREBIOTIC FIBER)  1.5 g CHEW Chew 1 tablet by mouth daily. Takes 1/4 chew daily   levothyroxine (SYNTHROID) 75 MCG tablet TAKE 1 TABLET BY MOUTH  DAILY BEFORE BREAKFAST   lisinopril (ZESTRIL) 20 MG tablet TAKE 1 TABLET BY MOUTH ONCE  DAILY   loperamide (IMODIUM A-D) 2 MG tablet Take 2 mg by mouth 4 (four) times daily as needed for diarrhea or loose stools.   Multiple Vitamin (MULTIVITAMIN) tablet Take 1 tablet by mouth daily.   OVER THE COUNTER MEDICATION Apply 1 Application topically as needed (knee pain). Emu cream blue rub topical   OVER THE COUNTER MEDICATION as needed. Equate- Childrens cough and cold syrup   rosuvastatin (CRESTOR) 20 MG tablet TAKE 1 TABLET BY MOUTH DAILY   [DISCONTINUED] citalopram (CELEXA) 20 MG tablet TAKE 1 AND 1/2 TABLETS BY MOUTH  DAILY   [DISCONTINUED] fluticasone (FLONASE) 50 MCG/ACT nasal spray USE 2 SPRAYS IN BOTH  NOSTRILS DAILY   [DISCONTINUED] levothyroxine (SYNTHROID) 75 MCG tablet TAKE 1 TABLET BY MOUTH DAILY  BEFORE BREAKFAST   No facility-administered encounter medications on file as of 06/28/2023.     Lab Results  Component Value Date   WBC 8.3 10/16/2022   HGB 13.4 10/16/2022   HCT 41.3 10/16/2022   PLT 379 10/16/2022   GLUCOSE 113 (H) 06/24/2023   CHOL 132 06/24/2023   TRIG 206 (H) 06/24/2023   HDL 46 06/24/2023   LDLDIRECT 135.3 07/20/2014   LDLCALC 53 06/24/2023   ALT 27 06/24/2023   AST 22 06/24/2023   NA 140 06/24/2023   K 4.7 06/24/2023   CL 102 06/24/2023   CREATININE 0.86 06/24/2023   BUN 13 06/24/2023   CO2 23 06/24/2023   TSH 3.410 06/24/2023   HGBA1C 6.7 (H) 06/24/2023       Assessment & Plan:  Anemia, unspecified type Assessment & Plan: Follow cbc.   Orders: -     CBC with Differential/Platelet; Future  Hypercholesterolemia Assessment &  Plan: Low cholesterol diet and exercise.  Follow lipid panel and liver function tests.  Continue crestor.   Orders: -     Lipid panel; Future -     Hepatic function panel; Future -     Basic metabolic panel; Future  Hyperglycemia Assessment & Plan: Low carb diet and exercise.  Follow met b and a1c.   Orders: -     Hemoglobin A1c; Future  Flu vaccine need -     Flu vaccine trivalent PF, 6mos and older(Flulaval,Afluria,Fluarix,Fluzone)  Essential hypertension Assessment & Plan: Blood pressure as outlined.  Continue lisinopril.  Follow pressures.  Follow metabolic panel.    Exertional dyspnea Assessment & Plan: Saw cardiology 09/25/22.  Echocardiogram completed 12//23 revealed LVEF of 60-65%, no regional wall motion abnormalities, G2 DD, with no valvular abnormalities noted. Coronary CTA was completed 09/24/2022 which revealed a normal coronary calcium score of 0. Normal coronary origin with right dominance, no evidence of CAD, CAD-RADS 0.  No chest pain.  Some sob with exertion, but reports is unchanged.  She feels is related to her weight. Discussed weight loss as outlined.    Hyperlipidemia due to dietary fat intake Assessment & Plan: Currently on crestor 20mg  q day.  Low cholesterol diet and exercise.  Follow lipid panel and liver function tests.    Severe obesity (HCC) Assessment & Plan: BMI 41. Low carb diet and exercise.  Discussed diet and exercise.  Discussed weight loss medication.  Discussed GLP 1 agonist. Discussed possible side effects and contraindication of medication.  Will start mounjaro 2.5mg  q week.  Follow.     Stress Assessment & Plan: On citalopram.  Continue.  Has buspar 5mg  bid prn.  Overall feels she is handling things relatively well.  Follow.     Tinnitus, unspecified laterality Assessment & Plan: Evaluated by ENT 02/2023 - no imaging recommended.    Other orders -     Citalopram Hydrobromide; TAKE 1 AND 1/2 TABLETS BY  MOUTH DAILY  Dispense:  135 tablet; Refill: 3 -     Fluticasone Propionate; USE 2 SPRAYS IN BOTH  NOSTRILS DAILY  Dispense: 48 g; Refill: 3 -     Levothyroxine Sodium; TAKE 1 TABLET BY MOUTH  DAILY BEFORE BREAKFAST  Dispense: 90 tablet; Refill: 3 -     Tirzepatide; Inject 2.5 mg into the skin once a week.  Dispense: 2 mL; Refill: 2     Dale Forreston, MD

## 2023-06-30 ENCOUNTER — Encounter: Payer: Self-pay | Admitting: Internal Medicine

## 2023-06-30 NOTE — Assessment & Plan Note (Signed)
Low cholesterol diet and exercise.  Follow lipid panel and liver function tests.  Continue crestor.

## 2023-06-30 NOTE — Assessment & Plan Note (Addendum)
Currently on crestor 20mg  q day.  Low cholesterol diet and exercise.  Follow lipid panel and liver function tests.   ?

## 2023-06-30 NOTE — Assessment & Plan Note (Signed)
BMI 41. Low carb diet and exercise.  Discussed diet and exercise.  Discussed weight loss medication.  Discussed GLP 1 agonist. Discussed possible side effects and contraindication of medication.  Will start mounjaro 2.5mg  q week.  Follow.

## 2023-06-30 NOTE — Assessment & Plan Note (Signed)
On citalopram.  Continue.  Has buspar 5mg  bid prn.  Overall feels she is handling things relatively well.  Follow.   ?

## 2023-06-30 NOTE — Assessment & Plan Note (Signed)
Blood pressure as outlined.  Continue lisinopril.  Follow pressures.  Follow metabolic panel.

## 2023-06-30 NOTE — Assessment & Plan Note (Signed)
Saw cardiology 09/25/22.  Echocardiogram completed 12//23 revealed LVEF of 60-65%, no regional wall motion abnormalities, G2 DD, with no valvular abnormalities noted. Coronary CTA was completed 09/24/2022 which revealed a normal coronary calcium score of 0. Normal coronary origin with right dominance, no evidence of CAD, CAD-RADS 0.  No chest pain.  Some sob with exertion, but reports is unchanged.  She feels is related to her weight. Discussed weight loss as outlined.

## 2023-06-30 NOTE — Assessment & Plan Note (Signed)
Low carb diet and exercise.  Follow met b and a1c.  

## 2023-06-30 NOTE — Assessment & Plan Note (Signed)
Evaluated by ENT 02/2023 - no imaging recommended.

## 2023-06-30 NOTE — Assessment & Plan Note (Signed)
Follow cbc.  

## 2023-07-01 ENCOUNTER — Telehealth: Payer: Self-pay

## 2023-07-01 ENCOUNTER — Other Ambulatory Visit (HOSPITAL_COMMUNITY): Payer: Self-pay

## 2023-07-01 NOTE — Telephone Encounter (Signed)
Pharmacy Patient Advocate Encounter   Received notification from Physician's Office that prior authorization for [facility] 2.5mg   is required/requested.   Insurance verification completed.   The patient is insured through [facility] .   Per test claim: PA required; PA submitted to [facility] via CoverMyMeds Key/confirmation #/EOC Key: [id number] Status is pending

## 2023-07-01 NOTE — Telephone Encounter (Signed)
Kristen Aguilar, not sure if wegovy is an option or if there are any options to help her get medication.  I can send in wegovy .25mg  - if needed.

## 2023-07-08 NOTE — Telephone Encounter (Signed)
Pharmacy Patient Advocate Encounter  Received notification from [facility] that Prior Authorization for Mounjaro 2.5MG /0.5ML pen-injectors has been DENIED.  Full denial letter will be uploaded to the media tab. See denial reason below.   PA #/Case ID/Reference #: Key: [id number]

## 2023-08-29 ENCOUNTER — Ambulatory Visit: Payer: Managed Care, Other (non HMO) | Admitting: Internal Medicine

## 2023-08-29 VITALS — BP 134/76 | HR 84 | Temp 98.0°F | Resp 16 | Ht 68.0 in | Wt 242.0 lb

## 2023-08-29 DIAGNOSIS — F439 Reaction to severe stress, unspecified: Secondary | ICD-10-CM

## 2023-08-29 DIAGNOSIS — E7800 Pure hypercholesterolemia, unspecified: Secondary | ICD-10-CM

## 2023-08-29 DIAGNOSIS — E1165 Type 2 diabetes mellitus with hyperglycemia: Secondary | ICD-10-CM

## 2023-08-29 DIAGNOSIS — R0683 Snoring: Secondary | ICD-10-CM | POA: Insufficient documentation

## 2023-08-29 DIAGNOSIS — Z7984 Long term (current) use of oral hypoglycemic drugs: Secondary | ICD-10-CM | POA: Diagnosis not present

## 2023-08-29 DIAGNOSIS — Z96659 Presence of unspecified artificial knee joint: Secondary | ICD-10-CM

## 2023-08-29 DIAGNOSIS — I10 Essential (primary) hypertension: Secondary | ICD-10-CM

## 2023-08-29 LAB — HM DIABETES FOOT EXAM

## 2023-08-29 MED ORDER — METFORMIN HCL ER 500 MG PO TB24: 500.0000 mg | ORAL_TABLET | Freq: Every day | ORAL | 3 refills | Status: DC

## 2023-08-29 NOTE — Progress Notes (Signed)
Subjective:    Patient ID: Kristen Aguilar, female    DOB: [birth year], 57 y.o.   MRN: [id number]  Patient here for  Chief Complaint  Patient presents with   Medical Management of Chronic Issues    HPI Here for a scheduled follow up. S/p TKR 07/06/22. Doing well status post her knee surgery. Saw cardiology 09/25/22. Echocardiogram completed 12//23 revealed LVEF of 60-65%, no regional wall motion abnormalities, G2 DD, with no valvular abnormalities noted. Coronary CTA was completed 09/24/2022 which revealed a normal coronary calcium score of 0. Normal coronary origin with right dominance, no evidence of CAD, CAD-RADS 0. Had discussed GLP 1 agonist last visit.  Rx written for mounjaro. Per note, insurance denied. Reports a family member had some issues with GLP 1 agonist and was hospitalized. She desires not to pursue treatment with this at this time.  Discussed her A1c and diabetes.  Discussed starting metformin.  Discussed insulin resistance. No chest pain or sob reported.  No abdominal pain or bowel change reported.  States her bowels are stable with her current regimen and watching her diet. Had f/u regarding her knee.  Recommended exercises.  Discussed increased fatigue, daytime somnolence and snoring. Discussed sleep apnea.  She is agreeable for referral.     Past Medical History:  Diagnosis Date   Allergy    Anemia    as a child   Anxiety    Arthritis    Depression    Gestational diabetes    Hyperlipidemia due to dietary fat intake 04/25/2014   Hypertension    Hypothyroidism    Pneumonia    as a child   PONV (postoperative nausea and vomiting)    with c sections   Pre-diabetes    Past Surgical History:  Procedure Laterality Date   CESAREAN SECTION  2001 & 2004   COLONOSCOPY WITH PROPOFOL N/A 06/09/2018   Procedure: COLONOSCOPY WITH PROPOFOL;  Surgeon: Wyline Mood, MD;  Location: [facility] ENDOSCOPY;  Service: Gastroenterology;  Laterality: N/A;   KNEE ARTHROPLASTY Right 07/06/2022    Procedure: COMPUTER ASSISTED TOTAL KNEE ARTHROPLASTY;  Surgeon: Donato Heinz, MD;  Location: [facility] ORS;  Service: Orthopedics;  Laterality: Right;   Family History  Problem Relation Age of Onset   Arrhythmia Mother        A-FIB   Hypertension Mother    Atrial fibrillation Mother    Atrial fibrillation Father    Arrhythmia Father        A-FIB   Hypertension Father    Fibromyalgia Sister    Hypertension Maternal Grandmother    Diabetes Maternal Grandmother    Breast cancer Paternal Grandmother    Social History   Socioeconomic History   Marital status: Married    Spouse name: Dorinda Hill   Number of children: 2   Years of education: Not on file   Highest education level: Not on file  Occupational History   Not on file  Tobacco Use   Smoking status: Never   Smokeless tobacco: Never  Vaping Use   Vaping status: Never Used  Substance and Sexual Activity   Alcohol use: No    Alcohol/week: 0.0 standard drinks of alcohol   Drug use: No   Sexual activity: Not on file  Other Topics Concern   Not on file  Social History Narrative   Not on file   Social Determinants of Health   Financial Resource Strain: Not on file  Food Insecurity: No Food Insecurity (07/06/2022)   Hunger Vital  Sign    Worried About Programme researcher, broadcasting/film/video in the Last Year: Never true    Ran Out of Food in the Last Year: Never true  Transportation Needs: No Transportation Needs (07/06/2022)   PRAPARE - Administrator, Civil Service (Medical): No    Lack of Transportation (Non-Medical): No  Physical Activity: Not on file  Stress: Not on file  Social Connections: Not on file     Review of Systems  Constitutional:  Negative for appetite change and unexpected weight change.  HENT:  Negative for congestion and sinus pressure.   Respiratory:  Negative for cough, chest tightness and shortness of breath.   Cardiovascular:  Negative for chest pain, palpitations and leg swelling.  Gastrointestinal:   Negative for abdominal pain, nausea and vomiting.       Bowels stable.   Genitourinary:  Negative for difficulty urinating and dysuria.  Musculoskeletal:  Negative for joint swelling and myalgias.  Skin:  Negative for color change and rash.  Neurological:  Negative for dizziness and headaches.  Psychiatric/Behavioral:  Negative for agitation and dysphoric mood.        Objective:     BP 134/76   Pulse 84   Temp 98 F (36.7 C)   Resp 16   Ht 5\' 8"  (1.727 m)   Wt 242 lb (109.8 kg)   LMP 10/09/2014   SpO2 98%   BMI 36.80 kg/m  Wt Readings from Last 3 Encounters:  08/29/23 242 lb (109.8 kg)  06/28/23 243 lb 9.6 oz (110.5 kg)  02/19/23 240 lb (108.9 kg)    Physical Exam Vitals reviewed.  Constitutional:      General: She is not in acute distress.    Appearance: Normal appearance.  HENT:     Head: Normocephalic and atraumatic.     Right Ear: External ear normal.     Left Ear: External ear normal.  Eyes:     General: No scleral icterus.       Right eye: No discharge.        Left eye: No discharge.     Conjunctiva/sclera: Conjunctivae normal.  Neck:     Thyroid: No thyromegaly.  Cardiovascular:     Rate and Rhythm: Normal rate and regular rhythm.  Pulmonary:     Effort: No respiratory distress.     Breath sounds: Normal breath sounds. No wheezing.  Abdominal:     General: Bowel sounds are normal.     Palpations: Abdomen is soft.     Tenderness: There is no abdominal tenderness.  Musculoskeletal:        General: No swelling or tenderness.     Cervical back: Neck supple. No tenderness.  Lymphadenopathy:     Cervical: No cervical adenopathy.  Skin:    Findings: No erythema or rash.  Neurological:     Mental Status: She is alert.  Psychiatric:        Mood and Affect: Mood normal.        Behavior: Behavior normal.      Outpatient Encounter Medications as of 08/29/2023  Medication Sig   [DISCONTINUED] metFORMIN (GLUCOPHAGE-XR) 500 MG 24 hr tablet Take 1  tablet (500 mg total) by mouth daily with breakfast.   acetaminophen (TYLENOL) 650 MG CR tablet Take 650 mg by mouth 2 (two) times daily.   cetirizine (ZYRTEC) 10 MG tablet Take 10 mg by mouth daily.   Cholecalciferol (VITAMIN D3 PO) Take 1,000 mcg by mouth daily.   citalopram (CELEXA)  20 MG tablet TAKE 1 AND 1/2 TABLETS BY  MOUTH DAILY   Coenzyme Q10 (CO Q 10) 100 MG CAPS Take 100 mg by mouth daily.   fish oil-omega-3 fatty acids 1000 MG capsule Take 2 g by mouth daily.   fluticasone (FLONASE) 50 MCG/ACT nasal spray USE 2 SPRAYS IN BOTH  NOSTRILS DAILY   Inulin (FIBER CHOICE PREBIOTIC FIBER) 1.5 g CHEW Chew 1 tablet by mouth daily. Takes 1/4 chew daily   levothyroxine (SYNTHROID) 75 MCG tablet TAKE 1 TABLET BY MOUTH  DAILY BEFORE BREAKFAST   lisinopril (ZESTRIL) 20 MG tablet TAKE 1 TABLET BY MOUTH ONCE  DAILY   loperamide (IMODIUM A-D) 2 MG tablet Take 2 mg by mouth 4 (four) times daily as needed for diarrhea or loose stools.   metFORMIN (GLUCOPHAGE-XR) 500 MG 24 hr tablet Take 1 tablet (500 mg total) by mouth daily with breakfast.   Multiple Vitamin (MULTIVITAMIN) tablet Take 1 tablet by mouth daily.   OVER THE COUNTER MEDICATION Apply 1 Application topically as needed (knee pain). Emu cream blue rub topical   OVER THE COUNTER MEDICATION as needed. Equate- Childrens cough and cold syrup   rosuvastatin (CRESTOR) 20 MG tablet TAKE 1 TABLET BY MOUTH DAILY   [DISCONTINUED] tirzepatide (MOUNJARO) 2.5 MG/0.5ML Pen Inject 2.5 mg into the skin once a week.   No facility-administered encounter medications on file as of 08/29/2023.     Lab Results  Component Value Date   WBC 8.3 10/16/2022   HGB 13.4 10/16/2022   HCT 41.3 10/16/2022   PLT 379 10/16/2022   GLUCOSE 113 (H) 06/24/2023   CHOL 132 06/24/2023   TRIG 206 (H) 06/24/2023   HDL 46 06/24/2023   LDLDIRECT 135.3 07/20/2014   LDLCALC 53 06/24/2023   ALT 27 06/24/2023   AST 22 06/24/2023   NA 140 06/24/2023   K 4.7 06/24/2023   CL 102  06/24/2023   CREATININE 0.86 06/24/2023   BUN 13 06/24/2023   CO2 23 06/24/2023   TSH 3.410 06/24/2023   HGBA1C 6.7 (H) 06/24/2023       Assessment & Plan:  Type 2 diabetes mellitus with hyperglycemia, without long-term current use of insulin (HCC) Assessment & Plan: Low carb diet and exercise.  Start metformin XR as directed.  Follow met b and A1c.    Snoring Assessment & Plan: Discussed increased fatigue, daytime somnolence and snoring.  Discussed possible sleep apnea.  Agreeable for referral to pulmonary for further evaluation.   Orders: -     Ambulatory referral to Pulmonology  Status post total knee replacement, unspecified laterality Assessment & Plan: S/p TKR 07/06/22.  Doing well status post her knee surgery.    Stress Assessment & Plan: On citalopram.  Continue.  Has buspar 5mg  bid prn.  Overall feels she is handling things relatively well.  Follow.     Hypercholesterolemia Assessment & Plan: Low cholesterol diet and exercise.  Follow lipid panel and liver function tests.  Continue crestor.    Essential hypertension Assessment & Plan: Blood pressure as outlined.  Continue lisinopril.  Follow pressures.  Follow metabolic panel.    Other orders -     metFORMIN HCl ER; Take 1 tablet (500 mg total) by mouth daily with breakfast.  Dispense: 30 tablet; Refill: 3     Dale Bell, MD

## 2023-09-01 ENCOUNTER — Encounter: Payer: Self-pay | Admitting: Internal Medicine

## 2023-09-01 NOTE — Assessment & Plan Note (Signed)
Low carb diet and exercise.  Start metformin XR as directed.  Follow met b and A1c.

## 2023-09-01 NOTE — Assessment & Plan Note (Signed)
On citalopram.  Continue.  Has buspar 5mg bid prn.  Overall feels she is handling things relatively well.  Follow.   

## 2023-09-01 NOTE — Assessment & Plan Note (Signed)
Low cholesterol diet and exercise.  Follow lipid panel and liver function tests.  Continue crestor.  

## 2023-09-01 NOTE — Assessment & Plan Note (Signed)
Discussed increased fatigue, daytime somnolence and snoring.  Discussed possible sleep apnea.  Agreeable for referral to pulmonary for further evaluation.

## 2023-09-01 NOTE — Assessment & Plan Note (Signed)
Blood pressure as outlined.  Continue lisinopril.  Follow pressures.  Follow metabolic panel.   

## 2023-09-01 NOTE — Assessment & Plan Note (Signed)
S/p TKR 07/06/22.  Doing well status post her knee surgery.

## 2023-10-17 ENCOUNTER — Ambulatory Visit: Payer: Managed Care, Other (non HMO) | Admitting: Nurse Practitioner

## 2023-10-17 ENCOUNTER — Encounter: Payer: Self-pay | Admitting: Nurse Practitioner

## 2023-10-17 VITALS — BP 140/80 | HR 82 | Temp 97.6°F | Ht 68.0 in | Wt 241.4 lb

## 2023-10-17 DIAGNOSIS — Z6836 Body mass index (BMI) 36.0-36.9, adult: Secondary | ICD-10-CM

## 2023-10-17 DIAGNOSIS — E66812 Obesity, class 2: Secondary | ICD-10-CM | POA: Diagnosis not present

## 2023-10-17 DIAGNOSIS — R0683 Snoring: Secondary | ICD-10-CM

## 2023-10-17 DIAGNOSIS — G4719 Other hypersomnia: Secondary | ICD-10-CM | POA: Diagnosis not present

## 2023-10-17 NOTE — Addendum Note (Signed)
 Addended by: Noemi Chapel on: 10/17/2023 03:17 PM   Modules accepted: Level of Service

## 2023-10-17 NOTE — Progress Notes (Signed)
 @Patient  ID: Kristen Aguilar, female    DOB: 25-May-1966, 58 y.o.   MRN: [id number]  Chief Complaint  Patient presents with   Consult    Snoring. Restless sleep. Excessive tiredness throughout the day.     Referring provider: Glendia Shad, MD  HPI: 58 year old female, never smoker referred for sleep consult. Past medical history significant for HTN, DM, HLD, obesity.  TEST/EVENTS:   10/17/2023: Today - sleep consult  Discussed the use of AI scribe software for clinical note transcription with the patient, who gave verbal consent to proceed.  History of Present Illness   The patient, accompanied by her spouse, presents for a sleep consultation due to significant daytime sleepiness. She reports intermittent snoring, as observed by her spouse, but denies any episodes of waking up gasping, choking, or short of breath. She also denies morning headaches. She has a history of teeth grinding, as noted by her dentist, but does not use a bite guard.  The patient reports frequent awakenings during the night, often due to knee pain. She used to fall asleep easily, but now she requires a midday nap to function. If she does not nap, she feels excessively tired.  She often uses her phone before sleeping.  She is not on any sleep medications and has never had a sleep study before. Her caffeine intake is limited to the mornings, and she denies any alcohol intake. She has no history of drowsy driving or sleep parasomnias/paralysis.  The patient has been gaining weight annually, but recently lost seven pounds after starting metformin  four to five weeks ago. She expresses a desire to have more energy, as she feels it takes her all day to complete tasks that used to take only a few hours.   She goes to bed around 1130pm-midnight.  Falls asleep within 15 to 20 minutes.  Usually wakes up once or twice to go to the bathroom.  Sometimes the pain in her knees also wakes her up.  Gets up for the day around 7 to  7:30 AM.  Does not operate any heavy machinery in her job animal nutritionist.  Has gained 10 pounds over the last 2 years.  Never had a previous sleep study.  No supplemental oxygen use.  She lives with her husband and her son.  She is a futures trader.      Epworth 14  Allergies  Allergen Reactions   Penicillins     IgE = 67 (WNL) on 06/28/2022 Tolerated cefazolin     Immunization History  Administered Date(s) Administered   Influenza Split 07/18/2014   Influenza Whole 06/11/1981   Influenza, Seasonal, Injecte, Preservative Fre 06/28/2023   Influenza,inj,Quad PF,6+ Mos 07/17/2018, 06/18/2019, 07/12/2020, 07/31/2021   Influenza-Unspecified 07/11/2015   Moderna Sars-Covid-2 Vaccination 06/24/2020, 07/31/2020   Tdap 04/02/2013    Past Medical History:  Diagnosis Date   Allergy    Anemia    as a child   Anxiety    Arthritis    Depression    Gestational diabetes    Hyperlipidemia due to dietary fat intake 04/25/2014   Hypertension    Hypothyroidism    Pneumonia    as a child   PONV (postoperative nausea and vomiting)    with c sections   Pre-diabetes     Tobacco History: Social History   Tobacco Use  Smoking Status Never  Smokeless Tobacco Never   Counseling given: Not Answered   Outpatient Medications Prior to Visit  Medication Sig Dispense Refill   acetaminophen  (  TYLENOL ) 650 MG CR tablet Take 650 mg by mouth 2 (two) times daily.     cetirizine (ZYRTEC) 10 MG tablet Take 10 mg by mouth daily.     Cholecalciferol  (VITAMIN D3 PO) Take 1,000 mcg by mouth daily.     citalopram  (CELEXA ) 20 MG tablet TAKE 1 AND 1/2 TABLETS BY  MOUTH DAILY 135 tablet 3   Coenzyme Q10 (CO Q 10) 100 MG CAPS Take 100 mg by mouth daily.     fish oil-omega-3 fatty acids 1000 MG capsule Take 2 g by mouth daily.     fluticasone  (FLONASE ) 50 MCG/ACT nasal spray USE 2 SPRAYS IN BOTH  NOSTRILS DAILY 48 g 3   Inulin  (FIBER CHOICE PREBIOTIC FIBER) 1.5 g CHEW Chew 1 tablet by mouth daily. Takes 1/4 chew daily      levothyroxine  (SYNTHROID ) 75 MCG tablet TAKE 1 TABLET BY MOUTH  DAILY BEFORE BREAKFAST 90 tablet 3   lisinopril  (ZESTRIL ) 20 MG tablet TAKE 1 TABLET BY MOUTH ONCE  DAILY 90 tablet 3   loperamide  (IMODIUM  A-D) 2 MG tablet Take 2 mg by mouth 4 (four) times daily as needed for diarrhea or loose stools.     metFORMIN  (GLUCOPHAGE -XR) 500 MG 24 hr tablet Take 1 tablet (500 mg total) by mouth daily with breakfast. 30 tablet 3   Multiple Vitamin (MULTIVITAMIN) tablet Take 1 tablet by mouth daily.     OVER THE COUNTER MEDICATION Apply 1 Application topically as needed (knee pain). Emu cream blue rub topical     OVER THE COUNTER MEDICATION as needed. Equate- Childrens cough and cold syrup     rosuvastatin  (CRESTOR ) 20 MG tablet TAKE 1 TABLET BY MOUTH DAILY 90 tablet 3   No facility-administered medications prior to visit.     Review of Systems:   Constitutional: No night sweats, fevers, chills, or lassitude. +weight gain, fatigue  HEENT: No headaches, difficulty swallowing, tooth/dental problems, or sore throat. No sneezing, itching, ear ache, nasal congestion, or post nasal drip CV:  No chest pain, orthopnea, PND, swelling in lower extremities, anasarca, dizziness, palpitations, syncope Resp: +snoring; baseline shortness of breath with exertion. No excess mucus or change in color of mucus. No productive or non-productive. No hemoptysis. No wheezing.  No chest wall deformity GI:  No heartburn, indigestion, abdominal pain, nausea, vomiting, diarrhea, change in bowel habits, loss of appetite, bloody stools.  GU: No dysuria, change in color of urine, urgency or frequency.  No flank pain, no hematuria  Skin: No rash, lesions, ulcerations MSK:  +chronic knee pain Neuro: No dizziness or lightheadedness.  Psych: +stable depression. No anxiety. Mood stable. +sleep disturbance     Physical Exam:  BP (!) 140/80 (BP Location: Left Arm, Patient Position: Sitting, Cuff Size: Large)   Pulse 82   Temp 97.6  F (36.4 C) (Temporal)   Ht 5' 8 (1.727 m)   Wt 241 lb 6.4 oz (109.5 kg)   LMP 10/09/2014   SpO2 97%   BMI 36.70 kg/m   GEN: Pleasant, interactive, well-appearing; obese; in no acute distress HEENT:  Normocephalic and atraumatic. PERRLA. Sclera white. Nasal turbinates pink, moist and patent bilaterally. No rhinorrhea present. Oropharynx pink and moist, without exudate or edema. No lesions, ulcerations, or postnasal drip. Mallampati III NECK:  Supple w/ fair ROM. No JVD present. Normal carotid impulses w/o bruits. Thyroid  symmetrical with no goiter or nodules palpated. No lymphadenopathy.   CV: RRR, no m/r/g, no peripheral edema. Pulses intact, +2 bilaterally. No cyanosis, pallor or  clubbing. PULMONARY:  Unlabored, regular breathing. Clear bilaterally A&P w/o wheezes/rales/rhonchi. No accessory muscle use.  GI: BS present and normoactive. Soft, non-tender to palpation. No organomegaly or masses detected.  MSK: No erythema, warmth or tenderness. Cap refil <2 sec all extrem. No deformities or joint swelling noted.  Neuro: A/Ox3. No focal deficits noted.   Skin: Warm, no lesions or rashe Psych: Normal affect and behavior. Judgement and thought content appropriate.     Lab Results:  CBC    Component Value Date/Time   WBC 8.3 10/16/2022 0904   WBC 10.9 (H) 06/28/2022 1413   RBC 4.64 10/16/2022 0904   RBC 4.69 06/28/2022 1413   HGB 13.4 10/16/2022 0904   HCT 41.3 10/16/2022 0904   PLT 379 10/16/2022 0904   MCV 89 10/16/2022 0904   MCH 28.9 10/16/2022 0904   MCH 29.0 06/28/2022 1413   MCHC 32.4 10/16/2022 0904   MCHC 32.4 06/28/2022 1413   RDW 12.9 10/16/2022 0904   LYMPHSABS 2.6 10/16/2022 0904   MONOABS 0.5 10/22/2016 0856   EOSABS 0.3 10/16/2022 0904   BASOSABS 0.0 10/16/2022 0904    BMET    Component Value Date/Time   NA 140 06/24/2023 0905   K 4.7 06/24/2023 0905   CL 102 06/24/2023 0905   CO2 23 06/24/2023 0905   GLUCOSE 113 (H) 06/24/2023 0905   GLUCOSE 118 (H)  09/18/2022 0944   BUN 13 06/24/2023 0905   CREATININE 0.86 06/24/2023 0905   CALCIUM  9.7 06/24/2023 0905   GFRNONAA >60 09/18/2022 0944   GFRAA 81 11/23/2020 0909    BNP No results found for: BNP   Imaging:  No results found.  Administration History     None           No data to display          No results found for: NITRICOXIDE      Assessment & Plan:   Excessive daytime sleepiness She has snoring, excessive daytime sleepiness, restless sleep. BMI 36. History of HTN, DM. Epworth 14. Given this,  I am concerned she could have sleep disordered breathing with obstructive sleep apnea. She will need sleep study for further evaluation.    - discussed how weight can impact sleep and risk for sleep disordered breathing - discussed options to assist with weight loss: combination of diet modification, cardiovascular and strength training exercises   - had an extensive discussion regarding the adverse health consequences related to untreated sleep disordered breathing - specifically discussed the risks for hypertension, coronary artery disease, cardiac dysrhythmias, cerebrovascular disease, and diabetes - lifestyle modification discussed   - discussed how sleep disruption can increase risk of accidents, particularly when driving - safe driving practices were discussed  Patient Instructions  Given your symptoms, I am concerned that you may have sleep disordered breathing with sleep apnea. You will need a sleep study for further evaluation. Someone will contact you to schedule this.   We discussed how untreated sleep apnea puts an individual at risk for cardiac arrhthymias, pulm HTN, DM, stroke and increases their risk for daytime accidents. We also briefly reviewed treatment options including weight loss, side sleeping position, oral appliance, CPAP therapy or referral to ENT for possible surgical options  Use caution when driving and pull over if you become  sleepy.  Follow up in 6-8 weeks with Izetta Naim Murtha,NP to go over sleep study results, or sooner, if needed     Class 2 obesity with body mass index (BMI) of 36.0  to 36.9 in adult BMI 36. Healthy weight loss encouraged  Snoring See above   Advised if symptoms do not improve or worsen, to please contact office for sooner follow up or seek emergency care.   I spent 35 minutes of dedicated to the care of this patient on the date of this encounter to include pre-visit review of records, face-to-face time with the patient discussing conditions above, post visit ordering of testing, clinical documentation with the electronic health record, making appropriate referrals as documented, and communicating necessary findings to members of the patients care team.  Comer LULLA Rouleau, NP 10/17/2023  Pt aware and understands NP's role.

## 2023-10-17 NOTE — Assessment & Plan Note (Signed)
 See above

## 2023-10-17 NOTE — Patient Instructions (Addendum)
Given your symptoms, I am concerned that you may have sleep disordered breathing with sleep apnea. You will need a sleep study for further evaluation. Someone will contact you to schedule this.   We discussed how untreated sleep apnea puts an individual at risk for cardiac arrhthymias, pulm HTN, DM, stroke and increases their risk for daytime accidents. We also briefly reviewed treatment options including weight loss, side sleeping position, oral appliance, CPAP therapy or referral to ENT for possible surgical options  Use caution when driving and pull over if you become sleepy.  Follow up in 6-8 weeks with Katie Gerri Acre,NP to go over sleep study results, or sooner, if needed

## 2023-10-17 NOTE — Assessment & Plan Note (Signed)
 She has snoring, excessive daytime sleepiness, restless sleep. BMI 36. History of HTN, DM. Epworth 14. Given this,  I am concerned she could have sleep disordered breathing with obstructive sleep apnea. She will need sleep study for further evaluation.    - discussed how weight can impact sleep and risk for sleep disordered breathing - discussed options to assist with weight loss: combination of diet modification, cardiovascular and strength training exercises   - had an extensive discussion regarding the adverse health consequences related to untreated sleep disordered breathing - specifically discussed the risks for hypertension, coronary artery disease, cardiac dysrhythmias, cerebrovascular disease, and diabetes - lifestyle modification discussed   - discussed how sleep disruption can increase risk of accidents, particularly when driving - safe driving practices were discussed  Patient Instructions  Given your symptoms, I am concerned that you may have sleep disordered breathing with sleep apnea. You will need a sleep study for further evaluation. Someone will contact you to schedule this.   We discussed how untreated sleep apnea puts an individual at risk for cardiac arrhthymias, pulm HTN, DM, stroke and increases their risk for daytime accidents. We also briefly reviewed treatment options including weight loss, side sleeping position, oral appliance, CPAP therapy or referral to ENT for possible surgical options  Use caution when driving and pull over if you become sleepy.  Follow up in 6-8 weeks with Katie Blessed Girdner,NP to go over sleep study results, or sooner, if needed

## 2023-10-17 NOTE — Assessment & Plan Note (Signed)
BMI 36. Healthy weight loss encouraged.

## 2023-10-31 DIAGNOSIS — G4719 Other hypersomnia: Secondary | ICD-10-CM

## 2023-10-31 DIAGNOSIS — R0683 Snoring: Secondary | ICD-10-CM

## 2023-11-26 ENCOUNTER — Encounter: Payer: Self-pay | Admitting: Nurse Practitioner

## 2023-11-26 ENCOUNTER — Ambulatory Visit (INDEPENDENT_AMBULATORY_CARE_PROVIDER_SITE_OTHER): Payer: Managed Care, Other (non HMO) | Admitting: Nurse Practitioner

## 2023-11-26 VITALS — BP 122/60 | HR 80 | Temp 97.7°F | Ht 64.0 in | Wt 240.2 lb

## 2023-11-26 DIAGNOSIS — G4719 Other hypersomnia: Secondary | ICD-10-CM | POA: Diagnosis not present

## 2023-11-26 DIAGNOSIS — G4733 Obstructive sleep apnea (adult) (pediatric): Secondary | ICD-10-CM

## 2023-11-26 NOTE — Progress Notes (Signed)
@Patient  ID: Kristen Aguilar, female    DOB: April 20, 1966, 58 y.o.   MRN: [id number]  Chief Complaint  Patient presents with   Follow-up    HST    Referring provider: Dale [place], MD  HPI: 58 year old female, never smoker followed for OSA. Past medical history significant for HTN, DM, HLD, obesity.  TEST/EVENTS:  10/28/2023 HST: AHI 21.8/h, SpO2 low 84%  10/17/2023: OV with Pj Zehner NP The patient, accompanied by her spouse, presents for a sleep consultation due to significant daytime sleepiness. She reports intermittent snoring, as observed by her spouse, but denies any episodes of waking up gasping, choking, or short of breath. She also denies morning headaches. She has a history of teeth grinding, as noted by her dentist, but does not use a bite guard. The patient reports frequent awakenings during the night, often due to knee pain. She used to fall asleep easily, but now she requires a midday nap to function. If she does not nap, she feels excessively tired.  She often uses her phone before sleeping. She is not on any sleep medications and has never had a sleep study before. Her caffeine intake is limited to the mornings, and she denies any alcohol intake. She has no history of drowsy driving or sleep parasomnias/paralysis. The patient has been gaining weight annually, but recently lost seven pounds after starting metformin four to five weeks ago. She expresses a desire to have more energy, as she feels it takes her all day to complete tasks that used to take only a few hours.  She goes to bed around 1130pm-midnight.  Falls asleep within 15 to 20 minutes.  Usually wakes up once or twice to go to the bathroom.  Sometimes the pain in her knees also wakes her up.  Gets up for the day around 7 to 7:30 AM.  Does not operate any heavy machinery in her job Animal nutritionist.  Has gained 10 pounds over the last 2 years.  Never had a previous sleep study.  No supplemental oxygen use. She lives with her husband  and her son.  She is a Futures trader.  Epworth 14  11/26/2023: Today - follow up Patients presents today for follow up after home sleep study which revealed moderate sleep apnea. She feels unchanged compared to our last visit. She has trouble staying asleep and feels tired during the day. She does have intermittent snoring. She denies drowsy driving or sleep parasomnias/paralysis. Here to discuss treatment options with her husband.   Allergies  Allergen Reactions   Penicillins     IgE = 67 (WNL) on 06/28/2022 Tolerated cefazolin    Immunization History  Administered Date(s) Administered   Influenza Split 07/18/2014   Influenza Whole 06/11/1981   Influenza, Seasonal, Injecte, Preservative Fre 06/28/2023   Influenza,inj,Quad PF,6+ Mos 07/17/2018, 06/18/2019, 07/12/2020, 07/31/2021   Influenza-Unspecified 07/11/2015   Moderna Sars-Covid-2 Vaccination 06/24/2020, 07/31/2020   Tdap 04/02/2013    Past Medical History:  Diagnosis Date   Allergy    Anemia    as a child   Anxiety    Arthritis    Depression    Gestational diabetes    Hyperlipidemia due to dietary fat intake 04/25/2014   Hypertension    Hypothyroidism    Pneumonia    as a child   PONV (postoperative nausea and vomiting)    with c sections   Pre-diabetes     Tobacco History: Social History   Tobacco Use  Smoking Status Never  Smokeless Tobacco  Never   Counseling given: Not Answered   Outpatient Medications Prior to Visit  Medication Sig Dispense Refill   acetaminophen (TYLENOL) 650 MG CR tablet Take 650 mg by mouth 2 (two) times daily.     cetirizine (ZYRTEC) 10 MG tablet Take 10 mg by mouth daily.     Cholecalciferol (VITAMIN D3 PO) Take 1,000 mcg by mouth daily.     citalopram (CELEXA) 20 MG tablet TAKE 1 AND 1/2 TABLETS BY  MOUTH DAILY 135 tablet 3   Coenzyme Q10 (CO Q 10) 100 MG CAPS Take 100 mg by mouth daily.     fish oil-omega-3 fatty acids 1000 MG capsule Take 2 g by mouth daily.     fluticasone  (FLONASE) 50 MCG/ACT nasal spray USE 2 SPRAYS IN BOTH  NOSTRILS DAILY 48 g 3   Inulin (FIBER CHOICE PREBIOTIC FIBER) 1.5 g CHEW Chew 1 tablet by mouth daily. Takes 1/4 chew daily     levothyroxine (SYNTHROID) 75 MCG tablet TAKE 1 TABLET BY MOUTH  DAILY BEFORE BREAKFAST 90 tablet 3   lisinopril (ZESTRIL) 20 MG tablet TAKE 1 TABLET BY MOUTH ONCE  DAILY 90 tablet 3   loperamide (IMODIUM A-D) 2 MG tablet Take 2 mg by mouth 4 (four) times daily as needed for diarrhea or loose stools.     metFORMIN (GLUCOPHAGE-XR) 500 MG 24 hr tablet Take 1 tablet (500 mg total) by mouth daily with breakfast. 30 tablet 3   Multiple Vitamin (MULTIVITAMIN) tablet Take 1 tablet by mouth daily.     OVER THE COUNTER MEDICATION Apply 1 Application topically as needed (knee pain). Emu cream blue rub topical     OVER THE COUNTER MEDICATION as needed. Equate- Childrens cough and cold syrup     rosuvastatin (CRESTOR) 20 MG tablet TAKE 1 TABLET BY MOUTH DAILY 90 tablet 3   No facility-administered medications prior to visit.     Review of Systems:   Constitutional: No night sweats, fevers, chills, or lassitude. +weight gain, fatigue  HEENT: No headaches, difficulty swallowing, tooth/dental problems, or sore throat. No sneezing, itching, ear ache, nasal congestion, or post nasal drip CV:  No chest pain, orthopnea, PND, swelling in lower extremities, anasarca, dizziness, palpitations, syncope Resp: +snoring; baseline shortness of breath with exertion. No excess mucus or change in color of mucus. No productive or non-productive. No hemoptysis. No wheezing.  No chest wall deformity GI:  No heartburn, indigestion, abdominal pain, nausea, vomiting, diarrhea, change in bowel habits, loss of appetite, bloody stools.  GU: No dysuria, change in color of urine, urgency or frequency.  No flank pain, no hematuria  Skin: No rash, lesions, ulcerations MSK:  +chronic knee pain Neuro: No dizziness or lightheadedness.  Psych: +stable  depression. No anxiety. Mood stable. +sleep disturbance     Physical Exam:  BP 122/60 (BP Location: Left Arm, Patient Position: Sitting, Cuff Size: Normal)   Pulse 80   Temp 97.7 F (36.5 C) (Temporal)   Ht 5\' 4"  (1.626 m)   Wt 240 lb 3.2 oz (109 kg)   LMP 10/09/2014   SpO2 96%   BMI 41.23 kg/m   GEN: Pleasant, interactive, well-appearing; obese; in no acute distress HEENT:  Normocephalic and atraumatic. PERRLA. Sclera white. Nasal turbinates pink, moist and patent bilaterally. No rhinorrhea present. Oropharynx pink and moist, without exudate or edema. No lesions, ulcerations, or postnasal drip. Mallampati III NECK:  Supple w/ fair ROM. No JVD present. Normal carotid impulses w/o bruits. Thyroid symmetrical with no goiter or  nodules palpated. No lymphadenopathy.   CV: RRR, no m/r/g, no peripheral edema. Pulses intact, +2 bilaterally. No cyanosis, pallor or clubbing. PULMONARY:  Unlabored, regular breathing. Clear bilaterally A&P w/o wheezes/rales/rhonchi. No accessory muscle use.  GI: BS present and normoactive. Soft, non-tender to palpation. No organomegaly or masses detected.  MSK: No erythema, warmth or tenderness. Cap refil <2 sec all extrem. No deformities or joint swelling noted.  Neuro: A/Ox3. No focal deficits noted.   Skin: Warm, no lesions or rashe Psych: Normal affect and behavior. Judgement and thought content appropriate.     Lab Results:  CBC    Component Value Date/Time   WBC 8.3 10/16/2022 0904   WBC 10.9 (H) 06/28/2022 1413   RBC 4.64 10/16/2022 0904   RBC 4.69 06/28/2022 1413   HGB 13.4 10/16/2022 0904   HCT 41.3 10/16/2022 0904   PLT 379 10/16/2022 0904   MCV 89 10/16/2022 0904   MCH 28.9 10/16/2022 0904   MCH 29.0 06/28/2022 1413   MCHC 32.4 10/16/2022 0904   MCHC 32.4 06/28/2022 1413   RDW 12.9 10/16/2022 0904   LYMPHSABS 2.6 10/16/2022 0904   MONOABS 0.5 10/22/2016 0856   EOSABS 0.3 10/16/2022 0904   BASOSABS 0.0 10/16/2022 0904    BMET     Component Value Date/Time   NA 140 06/24/2023 0905   K 4.7 06/24/2023 0905   CL 102 06/24/2023 0905   CO2 23 06/24/2023 0905   GLUCOSE 113 (H) 06/24/2023 0905   GLUCOSE 118 (H) 09/18/2022 0944   BUN 13 06/24/2023 0905   CREATININE 0.86 06/24/2023 0905   CALCIUM 9.7 06/24/2023 0905   GFRNONAA >60 09/18/2022 0944   GFRAA 81 11/23/2020 0909    BNP No results found for: "BNP"   Imaging:  No results found.  Administration History     None           No data to display          No results found for: "NITRICOXIDE"      Assessment & Plan:   Moderate obstructive sleep apnea Moderate OSA. Reviewed risks of untreated OSA and potential treatment options. Shared decision to move forward with CPAP given symptoms and severity. Orders placed for auto CPAP 5-15 cmH2O, mask of choice and heated humidity. Educated on proper care/use of device. Risks/benefits reviewed. Healthy weight loss encouraged. Aware of safe driving practices.   Patient Instructions  Start CPAP every night, minimum of 4-6 hours a night.  Change equipment as directed. Wash your tubing with warm soap and water daily, hang to dry. Wash humidifier portion weekly. Use bottled, distilled water and change daily Be aware of reduced alertness and do not drive or operate heavy machinery if experiencing this or drowsiness.  Exercise encouraged, as tolerated. Healthy weight management discussed.  Avoid or decrease alcohol consumption and medications that make you more sleepy, if possible. Notify if persistent daytime sleepiness occurs even with consistent use of PAP therapy.  We discussed how untreated sleep apnea puts an individual at risk for cardiac arrhthymias, pulm HTN, DM, stroke and increases their risk for daytime accidents. We also briefly reviewed treatment options including weight loss, side sleeping position, oral appliance, CPAP therapy or referral to ENT for possible surgical options  Change  supplies... Every month Mask cushions and/or nasal pillows CPAP machine filters Every 3 months Mask frame (not including the headgear) CPAP tubing Every 6 months Mask headgear Chin strap (if applicable) Humidifier water tub  Follow up in 10-12 weeks  with Katie Darby Fleeman,NP, or sooner, if needed   Severe obesity (BMI >= 40) (HCC) Severe obesity BMI 41. Healthy weight loss encouraged.   Excessive daytime sleepiness Likely due to untreated OSA. See above   Advised if symptoms do not improve or worsen, to please contact office for sooner follow up or seek emergency care.   I spent 35 minutes of dedicated to the care of this patient on the date of this encounter to include pre-visit review of records, face-to-face time with the patient discussing conditions above, post visit ordering of testing, clinical documentation with the electronic health record, making appropriate referrals as documented, and communicating necessary findings to members of the patients care team.  Noemi Chapel, NP 11/26/2023  Pt aware and understands NP's role.

## 2023-11-26 NOTE — Patient Instructions (Addendum)
Start CPAP every night, minimum of 4-6 hours a night.  Change equipment as directed. Wash your tubing with warm soap and water daily, hang to dry. Wash humidifier portion weekly. Use bottled, distilled water and change daily Be aware of reduced alertness and do not drive or operate heavy machinery if experiencing this or drowsiness.  Exercise encouraged, as tolerated. Healthy weight management discussed.  Avoid or decrease alcohol consumption and medications that make you more sleepy, if possible. Notify if persistent daytime sleepiness occurs even with consistent use of PAP therapy.  We discussed how untreated sleep apnea puts an individual at risk for cardiac arrhthymias, pulm HTN, DM, stroke and increases their risk for daytime accidents. We also briefly reviewed treatment options including weight loss, side sleeping position, oral appliance, CPAP therapy or referral to ENT for possible surgical options  Change supplies... Every month Mask cushions and/or nasal pillows CPAP machine filters Every 3 months Mask frame (not including the headgear) CPAP tubing Every 6 months Mask headgear Chin strap (if applicable) Humidifier water tub  Follow up in 10-12 weeks with Florentina Addison Sophronia Varney,NP, or sooner, if needed

## 2023-11-26 NOTE — Assessment & Plan Note (Signed)
Likely due to untreated OSA. See above

## 2023-11-26 NOTE — Assessment & Plan Note (Signed)
Severe obesity BMI 41. Healthy weight loss encouraged.

## 2023-11-26 NOTE — Assessment & Plan Note (Signed)
Moderate OSA. Reviewed risks of untreated OSA and potential treatment options. Shared decision to move forward with CPAP given symptoms and severity. Orders placed for auto CPAP 5-15 cmH2O, mask of choice and heated humidity. Educated on proper care/use of device. Risks/benefits reviewed. Healthy weight loss encouraged. Aware of safe driving practices.   Patient Instructions  Start CPAP every night, minimum of 4-6 hours a night.  Change equipment as directed. Wash your tubing with warm soap and water daily, hang to dry. Wash humidifier portion weekly. Use bottled, distilled water and change daily Be aware of reduced alertness and do not drive or operate heavy machinery if experiencing this or drowsiness.  Exercise encouraged, as tolerated. Healthy weight management discussed.  Avoid or decrease alcohol consumption and medications that make you more sleepy, if possible. Notify if persistent daytime sleepiness occurs even with consistent use of PAP therapy.  We discussed how untreated sleep apnea puts an individual at risk for cardiac arrhthymias, pulm HTN, DM, stroke and increases their risk for daytime accidents. We also briefly reviewed treatment options including weight loss, side sleeping position, oral appliance, CPAP therapy or referral to ENT for possible surgical options  Change supplies... Every month Mask cushions and/or nasal pillows CPAP machine filters Every 3 months Mask frame (not including the headgear) CPAP tubing Every 6 months Mask headgear Chin strap (if applicable) Humidifier water tub  Follow up in 10-12 weeks with Kristen Addison Durwood Dittus,NP, or sooner, if needed

## 2023-12-03 ENCOUNTER — Encounter: Payer: Self-pay | Admitting: Internal Medicine

## 2023-12-03 LAB — HM MAMMOGRAPHY

## 2023-12-09 ENCOUNTER — Telehealth: Payer: Self-pay

## 2023-12-09 ENCOUNTER — Other Ambulatory Visit (INDEPENDENT_AMBULATORY_CARE_PROVIDER_SITE_OTHER): Payer: Managed Care, Other (non HMO)

## 2023-12-09 DIAGNOSIS — I10 Essential (primary) hypertension: Secondary | ICD-10-CM

## 2023-12-09 DIAGNOSIS — E1165 Type 2 diabetes mellitus with hyperglycemia: Secondary | ICD-10-CM

## 2023-12-09 DIAGNOSIS — D649 Anemia, unspecified: Secondary | ICD-10-CM

## 2023-12-09 DIAGNOSIS — R739 Hyperglycemia, unspecified: Secondary | ICD-10-CM | POA: Diagnosis not present

## 2023-12-09 DIAGNOSIS — E7800 Pure hypercholesterolemia, unspecified: Secondary | ICD-10-CM

## 2023-12-09 NOTE — Telephone Encounter (Signed)
Labs reordered for [facility]  

## 2023-12-10 LAB — CBC WITH DIFFERENTIAL/PLATELET
Basophils Absolute: 0.1 10*3/uL (ref 0.0–0.2)
Basos: 1 %
EOS (ABSOLUTE): 0.3 10*3/uL (ref 0.0–0.4)
Eos: 3 %
Hematocrit: 43.6 % (ref 34.0–46.6)
Hemoglobin: 13.9 g/dL (ref 11.1–15.9)
Immature Grans (Abs): 0 10*3/uL (ref 0.0–0.1)
Immature Granulocytes: 0 %
Lymphocytes Absolute: 2.9 10*3/uL (ref 0.7–3.1)
Lymphs: 31 %
MCH: 29.5 pg (ref 26.6–33.0)
MCHC: 31.9 g/dL (ref 31.5–35.7)
MCV: 93 fL (ref 79–97)
Monocytes Absolute: 0.5 10*3/uL (ref 0.1–0.9)
Monocytes: 5 %
Neutrophils Absolute: 5.5 10*3/uL (ref 1.4–7.0)
Neutrophils: 60 %
Platelets: 339 10*3/uL (ref 150–450)
RBC: 4.71 x10E6/uL (ref 3.77–5.28)
RDW: 12.3 % (ref 11.7–15.4)
WBC: 9.2 10*3/uL (ref 3.4–10.8)

## 2023-12-10 LAB — HEPATIC FUNCTION PANEL
ALT: 18 IU/L (ref 0–32)
AST: 22 IU/L (ref 0–40)
Albumin: 4.4 g/dL (ref 3.8–4.9)
Alkaline Phosphatase: 92 IU/L (ref 44–121)
Bilirubin Total: 0.5 mg/dL (ref 0.0–1.2)
Bilirubin, Direct: 0.14 mg/dL (ref 0.00–0.40)
Total Protein: 6.6 g/dL (ref 6.0–8.5)

## 2023-12-10 LAB — BASIC METABOLIC PANEL
BUN/Creatinine Ratio: 18 (ref 9–23)
BUN: 15 mg/dL (ref 6–24)
CO2: 22 mmol/L (ref 20–29)
Calcium: 9.7 mg/dL (ref 8.7–10.2)
Chloride: 102 mmol/L (ref 96–106)
Creatinine, Ser: 0.85 mg/dL (ref 0.57–1.00)
Glucose: 105 mg/dL — ABNORMAL HIGH (ref 70–99)
Potassium: 4.6 mmol/L (ref 3.5–5.2)
Sodium: 140 mmol/L (ref 134–144)
eGFR: 79 mL/min/{1.73_m2} (ref >59)

## 2023-12-10 LAB — LIPID PANEL
Chol/HDL Ratio: 2.8 ratio (ref 0.0–4.4)
Cholesterol, Total: 130 mg/dL (ref 100–199)
HDL: 46 mg/dL (ref >39)
LDL Chol Calc (NIH): 56 mg/dL (ref 0–99)
Triglycerides: 170 mg/dL — ABNORMAL HIGH (ref 0–149)
VLDL Cholesterol Cal: 28 mg/dL (ref 5–40)

## 2023-12-10 LAB — HEMOGLOBIN A1C
Est. average glucose Bld gHb Est-mCnc: 143 mg/dL
Hgb A1c MFr Bld: 6.6 % — ABNORMAL HIGH (ref 4.8–5.6)

## 2023-12-11 ENCOUNTER — Ambulatory Visit (INDEPENDENT_AMBULATORY_CARE_PROVIDER_SITE_OTHER): Payer: Managed Care, Other (non HMO) | Admitting: Internal Medicine

## 2023-12-11 ENCOUNTER — Encounter: Payer: Self-pay | Admitting: Internal Medicine

## 2023-12-11 VITALS — BP 120/74 | HR 86 | Temp 98.0°F | Resp 16 | Ht 64.0 in | Wt 238.4 lb

## 2023-12-11 DIAGNOSIS — G4733 Obstructive sleep apnea (adult) (pediatric): Secondary | ICD-10-CM

## 2023-12-11 DIAGNOSIS — I10 Essential (primary) hypertension: Secondary | ICD-10-CM

## 2023-12-11 DIAGNOSIS — Z0000 Encounter for general adult medical examination without abnormal findings: Secondary | ICD-10-CM | POA: Diagnosis not present

## 2023-12-11 DIAGNOSIS — F439 Reaction to severe stress, unspecified: Secondary | ICD-10-CM

## 2023-12-11 DIAGNOSIS — E1165 Type 2 diabetes mellitus with hyperglycemia: Secondary | ICD-10-CM

## 2023-12-11 DIAGNOSIS — R739 Hyperglycemia, unspecified: Secondary | ICD-10-CM

## 2023-12-11 DIAGNOSIS — Z7984 Long term (current) use of oral hypoglycemic drugs: Secondary | ICD-10-CM

## 2023-12-11 DIAGNOSIS — E7800 Pure hypercholesterolemia, unspecified: Secondary | ICD-10-CM

## 2023-12-11 DIAGNOSIS — Z96659 Presence of unspecified artificial knee joint: Secondary | ICD-10-CM

## 2023-12-11 NOTE — Assessment & Plan Note (Signed)
 Physical today 12/11/23.   PAP 10/19/22 - negative with negative HPV. Colonoscopy 05/2018.  Recommended f/u in 10 years.  Mammogram 12/03/23 - Birads I.

## 2023-12-11 NOTE — Assessment & Plan Note (Signed)
 Continue low carb diet. Discussed diet and exercise. A1c 6.6.  continue metformin XR. Follow met b and A1c.

## 2023-12-11 NOTE — Progress Notes (Signed)
 Subjective:    Patient ID: Kristen Aguilar, female    DOB: 1965/11/09, 58 y.o.   MRN: [id number]  Patient here for  Chief Complaint  Patient presents with   Annual Exam    HPI Here for a physical exam. Saw pulmonary 11/26/23 - f/u HST - revealed moderate sleep apnea. Recommended auto cpap. Gets her machine next week. She has been watching her diet. Starting to lose weight. Taking metformin XR. Some occasional loose stool, but overall feels she is tolerating. Wants to continue. Discussed continuing diet and exercise. Breathing stable. Discussed labs.    Past Medical History:  Diagnosis Date   Allergy    Anemia    as a child   Anxiety    Arthritis    Depression    Gestational diabetes    Hyperlipidemia due to dietary fat intake 04/25/2014   Hypertension    Hypothyroidism    Pneumonia    as a child   PONV (postoperative nausea and vomiting)    with c sections   Pre-diabetes    Past Surgical History:  Procedure Laterality Date   CESAREAN SECTION  2001 & 2004   COLONOSCOPY WITH PROPOFOL N/A 06/09/2018   Procedure: COLONOSCOPY WITH PROPOFOL;  Surgeon: Wyline Mood, MD;  Location: [facility] ENDOSCOPY;  Service: Gastroenterology;  Laterality: N/A;   KNEE ARTHROPLASTY Right 07/06/2022   Procedure: COMPUTER ASSISTED TOTAL KNEE ARTHROPLASTY;  Surgeon: Donato Heinz, MD;  Location: [facility] ORS;  Service: Orthopedics;  Laterality: Right;   Family History  Problem Relation Age of Onset   Arrhythmia Mother        A-FIB   Hypertension Mother    Atrial fibrillation Mother    Atrial fibrillation Father    Arrhythmia Father        A-FIB   Hypertension Father    Fibromyalgia Sister    Hypertension Maternal Grandmother    Diabetes Maternal Grandmother    Breast cancer Paternal Grandmother    Social History   Socioeconomic History   Marital status: Married    Spouse name: Dorinda Hill   Number of children: 2   Years of education: Not on file   Highest education level: Not on file   Occupational History   Not on file  Tobacco Use   Smoking status: Never   Smokeless tobacco: Never  Vaping Use   Vaping status: Never Used  Substance and Sexual Activity   Alcohol use: No    Alcohol/week: 0.0 standard drinks of alcohol   Drug use: No   Sexual activity: Not on file  Other Topics Concern   Not on file  Social History Narrative   Not on file   Social Drivers of Health   Financial Resource Strain: Not on file  Food Insecurity: No Food Insecurity (07/06/2022)   Hunger Vital Sign    Worried About Running Out of Food in the Last Year: Never true    Ran Out of Food in the Last Year: Never true  Transportation Needs: No Transportation Needs (07/06/2022)   PRAPARE - Administrator, Civil Service (Medical): No    Lack of Transportation (Non-Medical): No  Physical Activity: Not on file  Stress: Not on file  Social Connections: Not on file     Review of Systems  Constitutional:  Negative for appetite change and unexpected weight change.  HENT:  Negative for congestion, sinus pressure and sore throat.   Eyes:  Negative for pain and visual disturbance.  Respiratory:  Negative  for cough, chest tightness and shortness of breath.   Cardiovascular:  Negative for chest pain and palpitations.  Gastrointestinal:  Negative for abdominal pain, nausea and vomiting.       Occasional loose stool as outlined.  Taking benefiber daily.   Genitourinary:  Negative for difficulty urinating and dysuria.  Musculoskeletal:  Negative for joint swelling and myalgias.  Skin:  Negative for color change and rash.  Neurological:  Negative for dizziness and headaches.  Hematological:  Negative for adenopathy. Does not bruise/bleed easily.  Psychiatric/Behavioral:  Negative for agitation and dysphoric mood.        Objective:     BP 120/74   Pulse 86   Temp 98 F (36.7 C)   Resp 16   Ht 5\' 4"  (1.626 m)   Wt 238 lb 6.4 oz (108.1 kg)   LMP 10/09/2014   SpO2 98%   BMI 40.92  kg/m  Wt Readings from Last 3 Encounters:  12/11/23 238 lb 6.4 oz (108.1 kg)  11/26/23 240 lb 3.2 oz (109 kg)  10/17/23 241 lb 6.4 oz (109.5 kg)    Physical Exam Vitals reviewed.  Constitutional:      General: She is not in acute distress.    Appearance: Normal appearance. She is well-developed.  HENT:     Head: Normocephalic and atraumatic.     Right Ear: External ear normal.     Left Ear: External ear normal.     Mouth/Throat:     Pharynx: No oropharyngeal exudate or posterior oropharyngeal erythema.  Eyes:     General: No scleral icterus.       Right eye: No discharge.        Left eye: No discharge.     Conjunctiva/sclera: Conjunctivae normal.  Neck:     Thyroid: No thyromegaly.  Cardiovascular:     Rate and Rhythm: Normal rate and regular rhythm.  Pulmonary:     Effort: No tachypnea, accessory muscle usage or respiratory distress.     Breath sounds: Normal breath sounds. No decreased breath sounds or wheezing.  Chest:  Breasts:    Right: No inverted nipple, mass, nipple discharge or tenderness (no axillary adenopathy).     Left: No inverted nipple, mass, nipple discharge or tenderness (no axilarry adenopathy).  Abdominal:     General: Bowel sounds are normal.     Palpations: Abdomen is soft.     Tenderness: There is no abdominal tenderness.  Musculoskeletal:        General: No swelling or tenderness.     Cervical back: Neck supple.  Lymphadenopathy:     Cervical: No cervical adenopathy.  Skin:    Findings: No erythema or rash.  Neurological:     Mental Status: She is alert and oriented to person, place, and time.  Psychiatric:        Mood and Affect: Mood normal.        Behavior: Behavior normal.         Outpatient Encounter Medications as of 12/11/2023  Medication Sig   acetaminophen (TYLENOL) 650 MG CR tablet Take 650 mg by mouth 2 (two) times daily.   cetirizine (ZYRTEC) 10 MG tablet Take 10 mg by mouth daily.   Cholecalciferol (VITAMIN D3 PO) Take  1,000 mcg by mouth daily.   citalopram (CELEXA) 20 MG tablet TAKE 1 AND 1/2 TABLETS BY  MOUTH DAILY   Coenzyme Q10 (CO Q 10) 100 MG CAPS Take 100 mg by mouth daily.   fish oil-omega-3 fatty acids  1000 MG capsule Take 2 g by mouth daily.   fluticasone (FLONASE) 50 MCG/ACT nasal spray USE 2 SPRAYS IN BOTH  NOSTRILS DAILY   Inulin (FIBER CHOICE PREBIOTIC FIBER) 1.5 g CHEW Chew 1 tablet by mouth daily. Takes 1/4 chew daily   levothyroxine (SYNTHROID) 75 MCG tablet TAKE 1 TABLET BY MOUTH  DAILY BEFORE BREAKFAST   lisinopril (ZESTRIL) 20 MG tablet TAKE 1 TABLET BY MOUTH ONCE  DAILY   loperamide (IMODIUM A-D) 2 MG tablet Take 2 mg by mouth 4 (four) times daily as needed for diarrhea or loose stools.   metFORMIN (GLUCOPHAGE-XR) 500 MG 24 hr tablet Take 1 tablet (500 mg total) by mouth daily with breakfast.   Multiple Vitamin (MULTIVITAMIN) tablet Take 1 tablet by mouth daily.   OVER THE COUNTER MEDICATION Apply 1 Application topically as needed (knee pain). Emu cream blue rub topical   OVER THE COUNTER MEDICATION as needed. Equate- Childrens cough and cold syrup   rosuvastatin (CRESTOR) 20 MG tablet TAKE 1 TABLET BY MOUTH DAILY   No facility-administered encounter medications on file as of 12/11/2023.     Lab Results  Component Value Date   WBC 9.2 12/09/2023   HGB 13.9 12/09/2023   HCT 43.6 12/09/2023   PLT 339 12/09/2023   GLUCOSE 105 (H) 12/09/2023   CHOL 130 12/09/2023   TRIG 170 (H) 12/09/2023   HDL 46 12/09/2023   LDLDIRECT 135.3 07/20/2014   LDLCALC 56 12/09/2023   ALT 18 12/09/2023   AST 22 12/09/2023   NA 140 12/09/2023   K 4.6 12/09/2023   CL 102 12/09/2023   CREATININE 0.85 12/09/2023   BUN 15 12/09/2023   CO2 22 12/09/2023   TSH 3.410 06/24/2023   HGBA1C 6.6 (H) 12/09/2023       Assessment & Plan:  Health care maintenance Assessment & Plan: Physical today 12/11/23.   PAP 10/19/22 - negative with negative HPV. Colonoscopy 05/2018.  Recommended f/u in 10 years.  Mammogram  12/03/23 - Birads I.   Hypercholesterolemia Assessment & Plan: Continue Crestor.  Low-cholesterol diet and exercise.  Follow lipid panel liver function testing.  Orders: -     Lipid panel; Future -     Hepatic function panel; Future  Essential hypertension -     CBC with Differential/Platelet; Future -     Basic metabolic panel; Future  Type 2 diabetes mellitus with hyperglycemia, without long-term current use of insulin (HCC) Assessment & Plan: Continue low carb diet. Discussed diet and exercise. A1c 6.6.  continue metformin XR. Follow met b and A1c.   Orders: -     Hemoglobin A1c; Future  Status post total knee replacement, unspecified laterality Assessment & Plan: S/p TKR 07/06/22.  Appears to be doing well status post knee surgery.   Stress Assessment & Plan: On citalopram.  Overall appears to be doing well.  Has BuSpar if needed.  Follow.  No changes.   Severe obesity (BMI >= 40) (HCC) Assessment & Plan: She is adjusting her diet.  On metformin XR.  Reports she appears to be tolerating relatively well.  Has lost some weight.  Continue diet and exercise.  Follow.   Moderate obstructive sleep apnea Assessment & Plan: Saw pulmonary 11/26/2023 for follow-up home sleep test.  Home sleep test revealed moderate sleep apnea.  Recommended auto CPAP.  She is supposed to get a machine next week.   Hyperglycemia Assessment & Plan: Low-carb diet and exercise.  Follow met b and A1c.  Dale Clover, MD

## 2023-12-15 ENCOUNTER — Encounter: Payer: Self-pay | Admitting: Internal Medicine

## 2023-12-15 NOTE — Assessment & Plan Note (Signed)
 On citalopram.  Overall appears to be doing well.  Has BuSpar if needed.  Follow.  No changes.

## 2023-12-15 NOTE — Assessment & Plan Note (Addendum)
 Low-carb diet and exercise.  Follow met b and A1c.

## 2023-12-15 NOTE — Assessment & Plan Note (Signed)
 Saw pulmonary 11/26/2023 for follow-up home sleep test.  Home sleep test revealed moderate sleep apnea.  Recommended auto CPAP.  She is supposed to get a machine next week.

## 2023-12-15 NOTE — Assessment & Plan Note (Signed)
 She is adjusting her diet.  On metformin XR.  Reports she appears to be tolerating relatively well.  Has lost some weight.  Continue diet and exercise.  Follow.

## 2023-12-15 NOTE — Assessment & Plan Note (Signed)
 S/p TKR 07/06/22.  Appears to be doing well status post knee surgery.

## 2023-12-15 NOTE — Assessment & Plan Note (Signed)
 Continue Crestor.  Low-cholesterol diet and exercise.  Follow lipid panel liver function testing.

## 2024-02-07 ENCOUNTER — Ambulatory Visit: Payer: Managed Care, Other (non HMO) | Admitting: Nurse Practitioner

## 2024-02-07 ENCOUNTER — Encounter: Payer: Self-pay | Admitting: Nurse Practitioner

## 2024-02-07 VITALS — BP 140/82 | HR 73 | Ht 64.0 in | Wt 241.6 lb

## 2024-02-07 DIAGNOSIS — G4733 Obstructive sleep apnea (adult) (pediatric): Secondary | ICD-10-CM

## 2024-02-07 NOTE — Patient Instructions (Addendum)
 Continue to use CPAP every night, minimum of 4-6 hours a night.  Change equipment as directed. Wash your tubing with warm soap and water daily, hang to dry. Wash humidifier portion weekly. Use bottled, distilled water and change daily Be aware of reduced alertness and do not drive or operate heavy machinery if experiencing this or drowsiness.  Exercise encouraged, as tolerated. Healthy weight management discussed.  Avoid or decrease alcohol consumption and medications that make you more sleepy, if possible. Notify if persistent daytime sleepiness occurs even with consistent use of PAP therapy.  CPAP use looks great!   Let me know if you need anything Otherwise, follow up in 1 year with Kristen Tameaka Eichhorn,NP, or sooner, if needed

## 2024-02-07 NOTE — Assessment & Plan Note (Signed)
 Moderate OSA on CPAP.  Excellent compliance and control.  She is receiving benefit from use.  Aware of proper care/use of device.  Understands risks of untreated sleep apnea.  Encouraged her to continue using nightly.  She will call if she has any issues.  Safe during practices reviewed.  Healthy weight loss encouraged.  Patient Instructions  Continue to use CPAP every night, minimum of 4-6 hours a night.  Change equipment as directed. Wash your tubing with warm soap and water daily, hang to dry. Wash humidifier portion weekly. Use bottled, distilled water and change daily Be aware of reduced alertness and do not drive or operate heavy machinery if experiencing this or drowsiness.  Exercise encouraged, as tolerated. Healthy weight management discussed.  Avoid or decrease alcohol consumption and medications that make you more sleepy, if possible. Notify if persistent daytime sleepiness occurs even with consistent use of PAP therapy.  CPAP use looks great!   Let me know if you need anything Otherwise, follow up in 1 year with Katie Ana Liaw,NP, or sooner, if needed

## 2024-02-07 NOTE — Progress Notes (Signed)
 @Patient  ID: Kristen Aguilar, female    DOB: 05-May-1966, 58 y.o.   MRN: [id number]  Chief Complaint  Patient presents with   Follow-up    Follow up regarding sleep apnea , pt has c-pap with her the patient isn't having any concerns with c-pap     Referring provider: Dellar Fenton, MD  HPI: 58 year old female, never smoker followed for OSA. Past medical history significant for HTN, DM, HLD, obesity.  TEST/EVENTS:  10/28/2023 HST: AHI 21.8/h, SpO2 low 84%  10/17/2023: OV with Adriel Desrosier NP The patient, accompanied by her spouse, presents for a sleep consultation due to significant daytime sleepiness. She reports intermittent snoring, as observed by her spouse, but denies any episodes of waking up gasping, choking, or short of breath. She also denies morning headaches. She has a history of teeth grinding, as noted by her dentist, but does not use a bite guard. The patient reports frequent awakenings during the night, often due to knee pain. She used to fall asleep easily, but now she requires a midday nap to function. If she does not nap, she feels excessively tired.  She often uses her phone before sleeping. She is not on any sleep medications and has never had a sleep study before. Her caffeine intake is limited to the mornings, and she denies any alcohol intake. She has no history of drowsy driving or sleep parasomnias/paralysis. The patient has been gaining weight annually, but recently lost seven pounds after starting metformin  four to five weeks ago. She expresses a desire to have more energy, as she feels it takes her all day to complete tasks that used to take only a few hours.  She goes to bed around 1130pm-midnight.  Falls asleep within 15 to 20 minutes.  Usually wakes up once or twice to go to the bathroom.  Sometimes the pain in her knees also wakes her up.  Gets up for the day around 7 to 7:30 AM.  Does not operate any heavy machinery in her job Animal nutritionist.  Has gained 10 pounds over the last  2 years.  Never had a previous sleep study.  No supplemental oxygen use. She lives with her husband and her son.  She is a Futures trader.  [place] [address]  11/26/2023: OV with Ranie Chinchilla NP for follow up after home sleep study which revealed moderate sleep apnea. She feels unchanged compared to our last visit. She has trouble staying asleep and feels tired during the day. She does have intermittent snoring. She denies drowsy driving or sleep parasomnias/paralysis. Here to discuss treatment options with her husband.   02/07/2024: Today - follow up Patient presents today for follow-up after being started on CPAP therapy for moderate sleep apnea.  She is doing fantastic with this.  Sleeping well at night.  Using it every night.  Feels like she is resting much better and energy levels are improved during the day.  Not having to nap as much.  No issues with drowsy driving or sleep parasomnia/paralysis.  No issues with mask fit.  Wearing a fullface mask.  01/08/2024-02/06/2024: CPAP 5-15 cmH2O 30/30 days; 100% >4 hr; average use 7 hours 10 minutes Pressure 95th 10.9 Leaks 95th 12.6 AHI 1  Allergies  Allergen Reactions   Penicillins     IgE = 67 (WNL) on 06/28/2022 Tolerated cefazolin     Immunization History  Administered Date(s) Administered   Influenza Split 07/18/2014   Influenza Whole 06/11/1981   Influenza, Seasonal, Injecte, Preservative Fre 06/28/2023   Influenza,inj,Quad  PF,6+ Mos 07/17/2018, 06/18/2019, 07/12/2020, 07/31/2021   Influenza-Unspecified 07/11/2015   Moderna Sars-Covid-2 Vaccination 06/24/2020, 07/31/2020   Tdap 04/02/2013    Past Medical History:  Diagnosis Date   Allergy    Anemia    as a child   Anxiety    Arthritis    Depression    Gestational diabetes    Hyperlipidemia due to dietary fat intake 04/25/2014   Hypertension    Hypothyroidism    Pneumonia    as a child   PONV (postoperative nausea and vomiting)    with c sections   Pre-diabetes     Tobacco  History: Social History   Tobacco Use  Smoking Status Never  Smokeless Tobacco Never   Counseling given: Not Answered   Outpatient Medications Prior to Visit  Medication Sig Dispense Refill   acetaminophen  (TYLENOL ) 650 MG CR tablet Take 650 mg by mouth 2 (two) times daily.     cetirizine (ZYRTEC) 10 MG tablet Take 10 mg by mouth daily.     Cholecalciferol  (VITAMIN D3 PO) Take 1,000 mcg by mouth daily.     citalopram  (CELEXA ) 20 MG tablet TAKE 1 AND 1/2 TABLETS BY  MOUTH DAILY 135 tablet 3   Coenzyme Q10 (CO Q 10) 100 MG CAPS Take 100 mg by mouth daily.     fish oil-omega-3 fatty acids 1000 MG capsule Take 2 g by mouth daily.     fluticasone  (FLONASE ) 50 MCG/ACT nasal spray USE 2 SPRAYS IN BOTH  NOSTRILS DAILY 48 g 3   Inulin  (FIBER CHOICE PREBIOTIC FIBER) 1.5 g CHEW Chew 1 tablet by mouth daily. Takes 1/4 chew daily     levothyroxine  (SYNTHROID ) 75 MCG tablet TAKE 1 TABLET BY MOUTH  DAILY BEFORE BREAKFAST 90 tablet 3   lisinopril  (ZESTRIL ) 20 MG tablet TAKE 1 TABLET BY MOUTH ONCE  DAILY 90 tablet 3   loperamide  (IMODIUM  A-D) 2 MG tablet Take 2 mg by mouth 4 (four) times daily as needed for diarrhea or loose stools.     metFORMIN  (GLUCOPHAGE -XR) 500 MG 24 hr tablet Take 1 tablet (500 mg total) by mouth daily with breakfast. 30 tablet 3   Multiple Vitamin (MULTIVITAMIN) tablet Take 1 tablet by mouth daily.     OVER THE COUNTER MEDICATION Apply 1 Application topically as needed (knee pain). Emu cream blue rub topical     OVER THE COUNTER MEDICATION as needed. Equate- Childrens cough and cold syrup     rosuvastatin  (CRESTOR ) 20 MG tablet TAKE 1 TABLET BY MOUTH DAILY 90 tablet 3   No facility-administered medications prior to visit.     Review of Systems:   Constitutional: No night sweats, fevers, chills, or lassitude, weight change, fatigue  HEENT: No headaches, difficulty swallowing, tooth/dental problems, or sore throat. No sneezing, itching, ear ache, nasal congestion, or post  nasal drip CV:  No chest pain, orthopnea, PND, swelling in lower extremities, anasarca, dizziness, palpitations, syncope Resp: +snoring (without CPAP); baseline shortness of breath with exertion. No excess mucus or change in color of mucus. No productive or non-productive. No hemoptysis. No wheezing.  No chest wall deformity GI:  No heartburn, indigestion GU: No nocturia  Skin: No rash, lesions, ulcerations MSK:  +chronic knee pain Neuro: No dizziness or lightheadedness.  Psych: +stable depression. No anxiety. Mood stable. +sleep disturbance (resolved with CPAP)    Physical Exam:  BP (!) 140/82   Pulse 73   Ht 5\' 4"  (1.626 m)   Wt 241 lb 9.6 oz (109.6  kg)   LMP 10/09/2014   SpO2 95%   BMI 41.47 kg/m   GEN: Pleasant, interactive, well-appearing; obese; in no acute distress HEENT:  Normocephalic and atraumatic. PERRLA. Sclera white. Nasal turbinates pink, moist and patent bilaterally. No rhinorrhea present. Oropharynx pink and moist, without exudate or edema. No lesions, ulcerations, or postnasal drip. Mallampati III NECK:  Supple w/ fair ROM. No JVD present. Normal carotid impulses w/o bruits. Thyroid  symmetrical with no goiter or nodules palpated. No lymphadenopathy.   CV: RRR, no m/r/g, no peripheral edema. Pulses intact, +2 bilaterally. No cyanosis, pallor or clubbing. PULMONARY:  Unlabored, regular breathing. Clear bilaterally A&P w/o wheezes/rales/rhonchi. No accessory muscle use.  GI: BS present and normoactive. Soft, non-tender to palpation. No organomegaly or masses detected.  MSK: No erythema, warmth or tenderness. Cap refil <2 sec all extrem. No deformities or joint swelling noted.  Neuro: A/Ox3. No focal deficits noted.   Skin: Warm, no lesions or rashe Psych: Normal affect and behavior. Judgement and thought content appropriate.     Lab Results:  CBC    Component Value Date/Time   WBC 9.2 12/09/2023 0842   WBC 10.9 (H) 06/28/2022 1413   RBC 4.71 12/09/2023 0842    RBC 4.69 06/28/2022 1413   HGB 13.9 12/09/2023 0842   HCT 43.6 12/09/2023 0842   PLT 339 12/09/2023 0842   MCV 93 12/09/2023 0842   MCH 29.5 12/09/2023 0842   MCH 29.0 06/28/2022 1413   MCHC 31.9 12/09/2023 0842   MCHC 32.4 06/28/2022 1413   RDW 12.3 12/09/2023 0842   LYMPHSABS 2.9 12/09/2023 0842   MONOABS 0.5 10/22/2016 0856   EOSABS 0.3 12/09/2023 0842   BASOSABS 0.1 12/09/2023 0842    BMET    Component Value Date/Time   NA 140 12/09/2023 0842   K 4.6 12/09/2023 0842   CL 102 12/09/2023 0842   CO2 22 12/09/2023 0842   GLUCOSE 105 (H) 12/09/2023 0842   GLUCOSE 118 (H) 09/18/2022 0944   BUN 15 12/09/2023 0842   CREATININE 0.85 12/09/2023 0842   CALCIUM  9.7 12/09/2023 0842   GFRNONAA >60 09/18/2022 0944   GFRAA 81 11/23/2020 0909    BNP No results found for: "BNP"   Imaging:  No results found.  Administration History     None           No data to display          No results found for: "NITRICOXIDE"      Assessment & Plan:   Moderate obstructive sleep apnea Moderate OSA on CPAP.  Excellent compliance and control.  She is receiving benefit from use.  Aware of proper care/use of device.  Understands risks of untreated sleep apnea.  Encouraged her to continue using nightly.  She will call if she has any issues.  Safe during practices reviewed.  Healthy weight loss encouraged.  Patient Instructions  Continue to use CPAP every night, minimum of 4-6 hours a night.  Change equipment as directed. Wash your tubing with warm soap and water daily, hang to dry. Wash humidifier portion weekly. Use bottled, distilled water and change daily Be aware of reduced alertness and do not drive or operate heavy machinery if experiencing this or drowsiness.  Exercise encouraged, as tolerated. Healthy weight management discussed.  Avoid or decrease alcohol consumption and medications that make you more sleepy, if possible. Notify if persistent daytime sleepiness  occurs even with consistent use of PAP therapy.  CPAP use looks great!   Let  me know if you need anything Otherwise, follow up in 1 year with Katie Revel Stellmach,NP, or sooner, if needed     Advised if symptoms do not improve or worsen, to please contact office for sooner follow up or seek emergency care.   I spent 21 minutes of dedicated to the care of this patient on the date of this encounter to include pre-visit review of records, face-to-face time with the patient discussing conditions above, post visit ordering of testing, clinical documentation with the electronic health record, making appropriate referrals as documented, and communicating necessary findings to members of the patients care team.  Roetta Clarke, NP 02/07/2024  Pt aware and understands NP's role.

## 2024-02-25 LAB — HM DIABETES EYE EXAM

## 2024-03-17 ENCOUNTER — Other Ambulatory Visit: Payer: Self-pay | Admitting: Internal Medicine

## 2024-04-07 ENCOUNTER — Other Ambulatory Visit (INDEPENDENT_AMBULATORY_CARE_PROVIDER_SITE_OTHER): Payer: Managed Care, Other (non HMO)

## 2024-04-07 DIAGNOSIS — I10 Essential (primary) hypertension: Secondary | ICD-10-CM

## 2024-04-07 DIAGNOSIS — E7800 Pure hypercholesterolemia, unspecified: Secondary | ICD-10-CM

## 2024-04-07 DIAGNOSIS — E1165 Type 2 diabetes mellitus with hyperglycemia: Secondary | ICD-10-CM

## 2024-04-08 ENCOUNTER — Ambulatory Visit: Payer: Self-pay | Admitting: Internal Medicine

## 2024-04-08 LAB — CBC WITH DIFFERENTIAL/PLATELET
Basophils Absolute: 0.1 10*3/uL (ref 0.0–0.2)
Basos: 1 %
EOS (ABSOLUTE): 0.4 10*3/uL (ref 0.0–0.4)
Eos: 5 %
Hematocrit: 43.3 % (ref 34.0–46.6)
Hemoglobin: 13.9 g/dL (ref 11.1–15.9)
Immature Grans (Abs): 0 10*3/uL (ref 0.0–0.1)
Immature Granulocytes: 0 %
Lymphocytes Absolute: 2.5 10*3/uL (ref 0.7–3.1)
Lymphs: 31 %
MCH: 30.2 pg (ref 26.6–33.0)
MCHC: 32.1 g/dL (ref 31.5–35.7)
MCV: 94 fL (ref 79–97)
Monocytes Absolute: 0.6 10*3/uL (ref 0.1–0.9)
Monocytes: 7 %
Neutrophils Absolute: 4.4 10*3/uL (ref 1.4–7.0)
Neutrophils: 56 %
Platelets: 304 10*3/uL (ref 150–450)
RBC: 4.61 x10E6/uL (ref 3.77–5.28)
RDW: 12.7 % (ref 11.7–15.4)
WBC: 8 10*3/uL (ref 3.4–10.8)

## 2024-04-08 LAB — HEPATIC FUNCTION PANEL
ALT: 20 IU/L (ref 0–32)
AST: 19 IU/L (ref 0–40)
Albumin: 4.4 g/dL (ref 3.8–4.9)
Alkaline Phosphatase: 85 IU/L (ref 44–121)
Bilirubin Total: 0.5 mg/dL (ref 0.0–1.2)
Bilirubin, Direct: 0.19 mg/dL (ref 0.00–0.40)
Total Protein: 6.8 g/dL (ref 6.0–8.5)

## 2024-04-08 LAB — BASIC METABOLIC PANEL WITH GFR
BUN/Creatinine Ratio: 13 (ref 9–23)
BUN: 14 mg/dL (ref 6–24)
CO2: 23 mmol/L (ref 20–29)
Calcium: 9.7 mg/dL (ref 8.7–10.2)
Chloride: 102 mmol/L (ref 96–106)
Creatinine, Ser: 1.05 mg/dL — ABNORMAL HIGH (ref 0.57–1.00)
Glucose: 112 mg/dL — ABNORMAL HIGH (ref 70–99)
Potassium: 4.8 mmol/L (ref 3.5–5.2)
Sodium: 141 mmol/L (ref 134–144)
eGFR: 61 mL/min/{1.73_m2} (ref >59)

## 2024-04-08 LAB — HEMOGLOBIN A1C
Est. average glucose Bld gHb Est-mCnc: 140 mg/dL
Hgb A1c MFr Bld: 6.5 % — ABNORMAL HIGH (ref 4.8–5.6)

## 2024-04-08 LAB — LIPID PANEL
Chol/HDL Ratio: 2.9 ratio (ref 0.0–4.4)
Cholesterol, Total: 136 mg/dL (ref 100–199)
HDL: 47 mg/dL (ref >39)
LDL Chol Calc (NIH): 60 mg/dL (ref 0–99)
Triglycerides: 174 mg/dL — ABNORMAL HIGH (ref 0–149)
VLDL Cholesterol Cal: 29 mg/dL (ref 5–40)

## 2024-04-09 ENCOUNTER — Encounter: Payer: Self-pay | Admitting: Internal Medicine

## 2024-04-09 ENCOUNTER — Ambulatory Visit: Payer: Managed Care, Other (non HMO) | Admitting: Internal Medicine

## 2024-04-09 VITALS — BP 126/74 | HR 80 | Temp 98.0°F | Resp 16 | Ht 64.0 in | Wt 241.0 lb

## 2024-04-09 DIAGNOSIS — E1165 Type 2 diabetes mellitus with hyperglycemia: Secondary | ICD-10-CM

## 2024-04-09 DIAGNOSIS — E7800 Pure hypercholesterolemia, unspecified: Secondary | ICD-10-CM

## 2024-04-09 DIAGNOSIS — F439 Reaction to severe stress, unspecified: Secondary | ICD-10-CM

## 2024-04-09 DIAGNOSIS — I10 Essential (primary) hypertension: Secondary | ICD-10-CM | POA: Diagnosis not present

## 2024-04-09 DIAGNOSIS — Z7984 Long term (current) use of oral hypoglycemic drugs: Secondary | ICD-10-CM

## 2024-04-09 DIAGNOSIS — R944 Abnormal results of kidney function studies: Secondary | ICD-10-CM | POA: Diagnosis not present

## 2024-04-09 DIAGNOSIS — G4733 Obstructive sleep apnea (adult) (pediatric): Secondary | ICD-10-CM

## 2024-04-09 NOTE — Progress Notes (Signed)
 Subjective:    Patient ID: Kristen Aguilar, female    DOB: Aug 11, 1966, 58 y.o.   MRN: [id number]  Patient here for  Chief Complaint  Patient presents with   Medical Management of Chronic Issues    HPI Here for a scheduled follow up - follow up regarding diabetes, hypertension and hypercholesterolemia. Continues on citalopram . Has buspar  if needed. Overall she feels she is doing well handling stress. Had f/u with pulmonary 02/07/24 - f/u sleep apnea. Using cpap regularly. Doing well with cpap. Feels better. Breathing stable. No chest pain. No abdominal pain. Bowels better.    Past Medical History:  Diagnosis Date   Allergy    Anemia    as a child   Anxiety    Arthritis    Depression    Gestational diabetes    Hyperlipidemia due to dietary fat intake 04/25/2014   Hypertension    Hypothyroidism    Pneumonia    as a child   PONV (postoperative nausea and vomiting)    with c sections   Pre-diabetes    Past Surgical History:  Procedure Laterality Date   CESAREAN SECTION  2001 & 2004   COLONOSCOPY WITH PROPOFOL  N/A 06/09/2018   Procedure: COLONOSCOPY WITH PROPOFOL ;  Surgeon: Therisa Bi, MD;  Location: [facility] ENDOSCOPY;  Service: Gastroenterology;  Laterality: N/A;   KNEE ARTHROPLASTY Right 07/06/2022   Procedure: COMPUTER ASSISTED TOTAL KNEE ARTHROPLASTY;  Surgeon: Mardee Lynwood SQUIBB, MD;  Location: [facility] ORS;  Service: Orthopedics;  Laterality: Right;   Family History  Problem Relation Age of Onset   Arrhythmia Mother        A-FIB   Hypertension Mother    Atrial fibrillation Mother    Atrial fibrillation Father    Arrhythmia Father        A-FIB   Hypertension Father    Fibromyalgia Sister    Hypertension Maternal Grandmother    Diabetes Maternal Grandmother    Breast cancer Paternal Grandmother    Social History   Socioeconomic History   Marital status: Married    Spouse name: Nancyann   Number of children: 2   Years of education: Not on file   Highest education  level: Not on file  Occupational History   Not on file  Tobacco Use   Smoking status: Never   Smokeless tobacco: Never  Vaping Use   Vaping status: Never Used  Substance and Sexual Activity   Alcohol use: No    Alcohol/week: 0.0 standard drinks of alcohol   Drug use: No   Sexual activity: Not on file  Other Topics Concern   Not on file  Social History Narrative   Not on file   Social Drivers of Health   Financial Resource Strain: Not on file  Food Insecurity: No Food Insecurity (07/06/2022)   Hunger Vital Sign    Worried About Running Out of Food in the Last Year: Never true    Ran Out of Food in the Last Year: Never true  Transportation Needs: No Transportation Needs (07/06/2022)   PRAPARE - Administrator, Civil Service (Medical): No    Lack of Transportation (Non-Medical): No  Physical Activity: Not on file  Stress: Not on file  Social Connections: Not on file     Review of Systems  Constitutional:  Negative for appetite change and unexpected weight change.  HENT:  Negative for congestion and sinus pressure.   Respiratory:  Negative for cough, chest tightness and shortness of breath.  Cardiovascular:  Negative for chest pain, palpitations and leg swelling.  Gastrointestinal:  Negative for abdominal pain, diarrhea, nausea and vomiting.  Genitourinary:  Negative for difficulty urinating and dysuria.  Musculoskeletal:  Negative for joint swelling and myalgias.  Skin:  Negative for color change and rash.  Neurological:  Negative for dizziness and headaches.  Psychiatric/Behavioral:  Negative for agitation and dysphoric mood.        Objective:     BP 126/74   Pulse 80   Temp 98 F (36.7 C)   Resp 16   Ht 5' 4 (1.626 m)   Wt 241 lb (109.3 kg)   LMP 10/09/2014   SpO2 98%   BMI 41.37 kg/m  Wt Readings from Last 3 Encounters:  04/09/24 241 lb (109.3 kg)  02/07/24 241 lb 9.6 oz (109.6 kg)  12/11/23 238 lb 6.4 oz (108.1 kg)    Physical  Exam Vitals reviewed.  Constitutional:      General: She is not in acute distress.    Appearance: Normal appearance.  HENT:     Head: Normocephalic and atraumatic.     Right Ear: External ear normal.     Left Ear: External ear normal.     Mouth/Throat:     Pharynx: No oropharyngeal exudate or posterior oropharyngeal erythema.   Eyes:     General: No scleral icterus.       Right eye: No discharge.        Left eye: No discharge.     Conjunctiva/sclera: Conjunctivae normal.   Neck:     Thyroid : No thyromegaly.   Cardiovascular:     Rate and Rhythm: Normal rate and regular rhythm.  Pulmonary:     Effort: No respiratory distress.     Breath sounds: Normal breath sounds. No wheezing.  Abdominal:     General: Bowel sounds are normal.     Palpations: Abdomen is soft.     Tenderness: There is no abdominal tenderness.   Musculoskeletal:        General: No swelling or tenderness.     Cervical back: Neck supple. No tenderness.  Lymphadenopathy:     Cervical: No cervical adenopathy.   Skin:    Findings: No erythema or rash.   Neurological:     Mental Status: She is alert.   Psychiatric:        Mood and Affect: Mood normal.        Behavior: Behavior normal.         Outpatient Encounter Medications as of 04/09/2024  Medication Sig   acetaminophen  (TYLENOL ) 650 MG CR tablet Take 650 mg by mouth 2 (two) times daily.   cetirizine (ZYRTEC) 10 MG tablet Take 10 mg by mouth daily.   Cholecalciferol  (VITAMIN D3 PO) Take 1,000 mcg by mouth daily.   citalopram  (CELEXA ) 20 MG tablet TAKE 1 AND 1/2 TABLETS BY  MOUTH DAILY   Coenzyme Q10 (CO Q 10) 100 MG CAPS Take 100 mg by mouth daily.   fish oil-omega-3 fatty acids 1000 MG capsule Take 2 g by mouth daily.   fluticasone  (FLONASE ) 50 MCG/ACT nasal spray USE 2 SPRAYS IN BOTH  NOSTRILS DAILY   Inulin  (FIBER CHOICE PREBIOTIC FIBER) 1.5 g CHEW Chew 1 tablet by mouth daily. Takes 1/4 chew daily   levothyroxine  (SYNTHROID ) 75 MCG tablet  TAKE 1 TABLET BY MOUTH  DAILY BEFORE BREAKFAST   lisinopril  (ZESTRIL ) 20 MG tablet TAKE 1 TABLET BY MOUTH ONCE  DAILY   loperamide  (IMODIUM  A-D) 2  MG tablet Take 2 mg by mouth 4 (four) times daily as needed for diarrhea or loose stools.   metFORMIN  (GLUCOPHAGE -XR) 500 MG 24 hr tablet TAKE 1 TABLET(500 MG) BY MOUTH DAILY WITH BREAKFAST   Multiple Vitamin (MULTIVITAMIN) tablet Take 1 tablet by mouth daily.   OVER THE COUNTER MEDICATION Apply 1 Application topically as needed (knee pain). Emu cream blue rub topical   OVER THE COUNTER MEDICATION as needed. Equate- Childrens cough and cold syrup   rosuvastatin  (CRESTOR ) 20 MG tablet TAKE 1 TABLET BY MOUTH DAILY   No facility-administered encounter medications on file as of 04/09/2024.     Lab Results  Component Value Date   WBC 8.0 04/07/2024   HGB 13.9 04/07/2024   HCT 43.3 04/07/2024   PLT 304 04/07/2024   GLUCOSE 112 (H) 04/07/2024   CHOL 136 04/07/2024   TRIG 174 (H) 04/07/2024   HDL 47 04/07/2024   LDLDIRECT 135.3 07/20/2014   LDLCALC 60 04/07/2024   ALT 20 04/07/2024   AST 19 04/07/2024   NA 141 04/07/2024   K 4.8 04/07/2024   CL 102 04/07/2024   CREATININE 1.05 (H) 04/07/2024   BUN 14 04/07/2024   CO2 23 04/07/2024   TSH 3.410 06/24/2023   HGBA1C 6.5 (H) 04/07/2024       Assessment & Plan:  Decreased GFR -     Basic metabolic panel with GFR; Future  Hypercholesterolemia -     Lipid panel; Future -     Hepatic function panel; Future -     TSH; Future  Type 2 diabetes mellitus with hyperglycemia, without long-term current use of insulin (HCC) Assessment & Plan: Continue low carb diet. Discussed diet and exercise. Continue metformin  XR. Follow met b and A1c.   Lab Results  Component Value Date   HGBA1C 6.5 (H) 04/07/2024     Orders: -     Hemoglobin A1c; Future -     Microalbumin / creatinine urine ratio; Future  Essential hypertension Assessment & Plan: Blood pressure as outlined.  Continue lisinopril .   Follow pressures. Doing well. Recheck as outlined. No changes in medication today.   Orders: -     Basic metabolic panel with GFR; Future  Stress Assessment & Plan: On citalopram .  Overall appears to be doing well. Has buspar  if needed. Stable. Does not feel needs anything more at this time.    Severe obesity (BMI >= 40) (HCC) Assessment & Plan: Continue diet and exercise.    Moderate obstructive sleep apnea Assessment & Plan: Using cpap. Using regularly. Does feel better. Follow.       Allena Hamilton, MD

## 2024-04-12 ENCOUNTER — Encounter: Payer: Self-pay | Admitting: Internal Medicine

## 2024-04-12 NOTE — Assessment & Plan Note (Signed)
 Continue low carb diet. Discussed diet and exercise. Continue metformin  XR. Follow met b and A1c.   Lab Results  Component Value Date   HGBA1C 6.5 (H) 04/07/2024

## 2024-04-12 NOTE — Assessment & Plan Note (Signed)
 On citalopram .  Overall appears to be doing well. Has buspar  if needed. Stable. Does not feel needs anything more at this time.

## 2024-04-12 NOTE — Assessment & Plan Note (Signed)
 Blood pressure as outlined.  Continue lisinopril .  Follow pressures. Doing well. Recheck as outlined. No changes in medication today.

## 2024-04-12 NOTE — Assessment & Plan Note (Signed)
 Continue diet and exercise.

## 2024-04-12 NOTE — Assessment & Plan Note (Signed)
 Using cpap. Using regularly. Does feel better. Follow.

## 2024-04-30 ENCOUNTER — Other Ambulatory Visit (INDEPENDENT_AMBULATORY_CARE_PROVIDER_SITE_OTHER)

## 2024-04-30 DIAGNOSIS — R944 Abnormal results of kidney function studies: Secondary | ICD-10-CM

## 2024-04-30 NOTE — Addendum Note (Signed)
 Addended by: TANDA HARVEY D on: 04/30/2024 09:55 AM   Modules accepted: Orders

## 2024-05-01 ENCOUNTER — Ambulatory Visit: Payer: Self-pay | Admitting: Internal Medicine

## 2024-05-01 LAB — BASIC METABOLIC PANEL WITH GFR
BUN/Creatinine Ratio: 15 (ref 9–23)
BUN: 13 mg/dL (ref 6–24)
CO2: 20 mmol/L (ref 20–29)
Calcium: 9.3 mg/dL (ref 8.7–10.2)
Chloride: 102 mmol/L (ref 96–106)
Creatinine, Ser: 0.89 mg/dL (ref 0.57–1.00)
Glucose: 136 mg/dL — ABNORMAL HIGH (ref 70–99)
Potassium: 4.3 mmol/L (ref 3.5–5.2)
Sodium: 140 mmol/L (ref 134–144)
eGFR: 75 mL/min/1.73 (ref >59)

## 2024-06-16 ENCOUNTER — Other Ambulatory Visit: Payer: Self-pay

## 2024-06-16 MED ORDER — METFORMIN HCL ER 500 MG PO TB24: 500.0000 mg | ORAL_TABLET | Freq: Every day | ORAL | 0 refills | Status: DC

## 2024-07-03 ENCOUNTER — Other Ambulatory Visit: Payer: Self-pay | Admitting: Internal Medicine

## 2024-08-01 ENCOUNTER — Other Ambulatory Visit: Payer: Self-pay | Admitting: Internal Medicine

## 2024-08-05 ENCOUNTER — Other Ambulatory Visit

## 2024-08-05 DIAGNOSIS — E1165 Type 2 diabetes mellitus with hyperglycemia: Secondary | ICD-10-CM

## 2024-08-05 DIAGNOSIS — I10 Essential (primary) hypertension: Secondary | ICD-10-CM

## 2024-08-05 DIAGNOSIS — E7800 Pure hypercholesterolemia, unspecified: Secondary | ICD-10-CM

## 2024-08-06 LAB — BASIC METABOLIC PANEL WITH GFR
BUN/Creatinine Ratio: 16 (ref 9–23)
BUN: 16 mg/dL (ref 6–24)
CO2: 21 mmol/L (ref 20–29)
Calcium: 9.5 mg/dL (ref 8.7–10.2)
Chloride: 99 mmol/L (ref 96–106)
Creatinine, Ser: 1.02 mg/dL — ABNORMAL HIGH (ref 0.57–1.00)
Glucose: 111 mg/dL — ABNORMAL HIGH (ref 70–99)
Potassium: 4.3 mmol/L (ref 3.5–5.2)
Sodium: 137 mmol/L (ref 134–144)
eGFR: 63 mL/min/1.73 (ref >59)

## 2024-08-06 LAB — HEPATIC FUNCTION PANEL
ALT: 16 IU/L (ref 0–32)
AST: 18 IU/L (ref 0–40)
Albumin: 4.6 g/dL (ref 3.8–4.9)
Alkaline Phosphatase: 90 IU/L (ref 49–135)
Bilirubin Total: 0.5 mg/dL (ref 0.0–1.2)
Bilirubin, Direct: 0.17 mg/dL (ref 0.00–0.40)
Total Protein: 7.4 g/dL (ref 6.0–8.5)

## 2024-08-06 LAB — MICROALBUMIN / CREATININE URINE RATIO
Creatinine, Urine: 64.9 mg/dL
Microalb/Creat Ratio: 19 mg/g{creat} (ref 0–29)
Microalbumin, Urine: 12.1 ug/mL

## 2024-08-06 LAB — LIPID PANEL
Chol/HDL Ratio: 3 ratio (ref 0.0–4.4)
Cholesterol, Total: 153 mg/dL (ref 100–199)
HDL: 51 mg/dL (ref >39)
LDL Chol Calc (NIH): 74 mg/dL (ref 0–99)
Triglycerides: 162 mg/dL — ABNORMAL HIGH (ref 0–149)
VLDL Cholesterol Cal: 28 mg/dL (ref 5–40)

## 2024-08-06 LAB — HEMOGLOBIN A1C
Est. average glucose Bld gHb Est-mCnc: 143 mg/dL
Hgb A1c MFr Bld: 6.6 % — ABNORMAL HIGH (ref 4.8–5.6)

## 2024-08-06 LAB — TSH: TSH: 3.55 u[IU]/mL (ref 0.450–4.500)

## 2024-08-11 ENCOUNTER — Ambulatory Visit: Admitting: Internal Medicine

## 2024-08-11 ENCOUNTER — Encounter: Payer: Self-pay | Admitting: Internal Medicine

## 2024-08-11 VITALS — BP 136/76 | HR 85 | Temp 98.4°F | Ht 64.0 in | Wt 238.6 lb

## 2024-08-11 DIAGNOSIS — Z23 Encounter for immunization: Secondary | ICD-10-CM | POA: Diagnosis not present

## 2024-08-11 DIAGNOSIS — E1165 Type 2 diabetes mellitus with hyperglycemia: Secondary | ICD-10-CM | POA: Diagnosis not present

## 2024-08-11 DIAGNOSIS — Z7984 Long term (current) use of oral hypoglycemic drugs: Secondary | ICD-10-CM

## 2024-08-11 DIAGNOSIS — F439 Reaction to severe stress, unspecified: Secondary | ICD-10-CM | POA: Diagnosis not present

## 2024-08-11 DIAGNOSIS — I10 Essential (primary) hypertension: Secondary | ICD-10-CM

## 2024-08-11 DIAGNOSIS — E7800 Pure hypercholesterolemia, unspecified: Secondary | ICD-10-CM | POA: Diagnosis not present

## 2024-08-11 DIAGNOSIS — G4733 Obstructive sleep apnea (adult) (pediatric): Secondary | ICD-10-CM

## 2024-08-11 DIAGNOSIS — E7849 Other hyperlipidemia: Secondary | ICD-10-CM

## 2024-08-11 MED ORDER — LEVOTHYROXINE SODIUM 75 MCG PO TABS: ORAL_TABLET | ORAL | 3 refills | Status: AC

## 2024-08-11 MED ORDER — BUSPIRONE HCL 5 MG PO TABS: 5.0000 mg | ORAL_TABLET | Freq: Two times a day (BID) | ORAL | 0 refills | Status: DC | PRN

## 2024-08-11 MED ORDER — METFORMIN HCL ER 500 MG PO TB24: 500.0000 mg | ORAL_TABLET | Freq: Every day | ORAL | 1 refills | Status: DC

## 2024-08-11 NOTE — Progress Notes (Signed)
 Subjective:    Patient ID: Kristen Aguilar, female    DOB: 09-Sep-1966, 58 y.o.   MRN: [id number]  Patient here for  Chief Complaint  Patient presents with   Medical Management of Chronic Issues    HPI Here for a scheduled follow up -  follow up regarding diabetes, hypertension and hypercholesterolemia. Continues on citalopram . Has buspar  if needed.  Had f/u with pulmonary 02/07/24 - f/u sleep apnea. Using cpap regularly. Discussed recent labs. A1c 6.6. discussed continuing diet and exercise. No chest pain or sob reported. No abdominal pain or bowel change reported.    Past Medical History:  Diagnosis Date   Allergy    Anemia    as a child   Anxiety    Arthritis    Depression    Gestational diabetes    Hyperlipidemia due to dietary fat intake 04/25/2014   Hypertension    Hypothyroidism    Pneumonia    as a child   PONV (postoperative nausea and vomiting)    with c sections   Pre-diabetes    Past Surgical History:  Procedure Laterality Date   CESAREAN SECTION  2001 & 2004   COLONOSCOPY WITH PROPOFOL  N/A 06/09/2018   Procedure: COLONOSCOPY WITH PROPOFOL ;  Surgeon: Therisa Bi, MD;  Location: [facility] ENDOSCOPY;  Service: Gastroenterology;  Laterality: N/A;   KNEE ARTHROPLASTY Right 07/06/2022   Procedure: COMPUTER ASSISTED TOTAL KNEE ARTHROPLASTY;  Surgeon: Mardee Lynwood SQUIBB, MD;  Location: [facility] ORS;  Service: Orthopedics;  Laterality: Right;   Family History  Problem Relation Age of Onset   Arrhythmia Mother        A-FIB   Hypertension Mother    Atrial fibrillation Mother    Atrial fibrillation Father    Arrhythmia Father        A-FIB   Hypertension Father    Fibromyalgia Sister    Hypertension Maternal Grandmother    Diabetes Maternal Grandmother    Breast cancer Paternal Grandmother    Social History   Socioeconomic History   Marital status: Married    Spouse name: Nancyann   Number of children: 2   Years of education: Not on file   Highest education level: Not  on file  Occupational History   Not on file  Tobacco Use   Smoking status: Never   Smokeless tobacco: Never  Vaping Use   Vaping status: Never Used  Substance and Sexual Activity   Alcohol use: No    Alcohol/week: 0.0 standard drinks of alcohol   Drug use: No   Sexual activity: Not on file  Other Topics Concern   Not on file  Social History Narrative   Not on file   Social Drivers of Health   Financial Resource Strain: Not on file  Food Insecurity: No Food Insecurity (07/06/2022)   Hunger Vital Sign    Worried About Running Out of Food in the Last Year: Never true    Ran Out of Food in the Last Year: Never true  Transportation Needs: No Transportation Needs (07/06/2022)   PRAPARE - Administrator, Civil Service (Medical): No    Lack of Transportation (Non-Medical): No  Physical Activity: Not on file  Stress: Not on file  Social Connections: Not on file     Review of Systems  Constitutional:  Negative for appetite change and unexpected weight change.  HENT:  Negative for congestion and sinus pressure.   Respiratory:  Negative for cough, chest tightness and shortness of breath.  Cardiovascular:  Negative for chest pain, palpitations and leg swelling.  Gastrointestinal:  Negative for abdominal pain, diarrhea, nausea and vomiting.  Genitourinary:  Negative for difficulty urinating and dysuria.  Musculoskeletal:  Negative for joint swelling and myalgias.  Skin:  Negative for color change and rash.  Neurological:  Negative for dizziness and headaches.  Psychiatric/Behavioral:  Negative for agitation and dysphoric mood.        Objective:     BP 136/76   Pulse 85   Temp 98.4 F (36.9 C) (Oral)   Ht 5' 4 (1.626 m)   Wt 238 lb 9.6 oz (108.2 kg)   LMP 10/09/2014   SpO2 98%   BMI 40.96 kg/m  Wt Readings from Last 3 Encounters:  08/11/24 238 lb 9.6 oz (108.2 kg)  04/09/24 241 lb (109.3 kg)  02/07/24 241 lb 9.6 oz (109.6 kg)    Physical Exam Vitals  reviewed.  Constitutional:      General: She is not in acute distress.    Appearance: Normal appearance.  HENT:     Head: Normocephalic and atraumatic.     Right Ear: External ear normal.     Left Ear: External ear normal.     Mouth/Throat:     Pharynx: No oropharyngeal exudate or posterior oropharyngeal erythema.  Eyes:     General: No scleral icterus.       Right eye: No discharge.        Left eye: No discharge.     Conjunctiva/sclera: Conjunctivae normal.  Neck:     Thyroid : No thyromegaly.  Cardiovascular:     Rate and Rhythm: Normal rate and regular rhythm.  Pulmonary:     Effort: No respiratory distress.     Breath sounds: Normal breath sounds. No wheezing.  Abdominal:     General: Bowel sounds are normal.     Palpations: Abdomen is soft.     Tenderness: There is no abdominal tenderness.  Musculoskeletal:        General: No swelling or tenderness.     Cervical back: Neck supple. No tenderness.  Lymphadenopathy:     Cervical: No cervical adenopathy.  Skin:    Findings: No erythema or rash.  Neurological:     Mental Status: She is alert.  Psychiatric:        Mood and Affect: Mood normal.        Behavior: Behavior normal.         Outpatient Encounter Medications as of 08/11/2024  Medication Sig   acetaminophen  (TYLENOL ) 650 MG CR tablet Take 650 mg by mouth 2 (two) times daily.   busPIRone  (BUSPAR ) 5 MG tablet Take 1 tablet (5 mg total) by mouth 2 (two) times daily as needed.   cetirizine (ZYRTEC) 10 MG tablet Take 10 mg by mouth daily.   Cholecalciferol  (VITAMIN D3 PO) Take 1,000 mcg by mouth daily.   citalopram  (CELEXA ) 20 MG tablet TAKE 1 AND 1/2 TABLETS BY MOUTH DAILY   Coenzyme Q10 (CO Q 10) 100 MG CAPS Take 100 mg by mouth daily.   fish oil-omega-3 fatty acids 1000 MG capsule Take 2 g by mouth daily.   fluticasone  (FLONASE ) 50 MCG/ACT nasal spray USE 2 SPRAYS IN BOTH  NOSTRILS DAILY   Inulin  (FIBER CHOICE PREBIOTIC FIBER) 1.5 g CHEW Chew 1 tablet by  mouth daily. Takes 1/4 chew daily   lisinopril  (ZESTRIL ) 20 MG tablet TAKE 1 TABLET BY MOUTH EVERY DAY   loperamide  (IMODIUM  A-D) 2 MG tablet Take 2 mg  by mouth 4 (four) times daily as needed for diarrhea or loose stools.   Multiple Vitamin (MULTIVITAMIN) tablet Take 1 tablet by mouth daily.   OVER THE COUNTER MEDICATION Apply 1 Application topically as needed (knee pain). Emu cream blue rub topical   OVER THE COUNTER MEDICATION as needed. Equate- Childrens cough and cold syrup   rosuvastatin  (CRESTOR ) 20 MG tablet TAKE 1 TABLET BY MOUTH EVERY DAY   levothyroxine  (SYNTHROID ) 75 MCG tablet TAKE 1 TABLET BY MOUTH  DAILY BEFORE BREAKFAST   metFORMIN  (GLUCOPHAGE -XR) 500 MG 24 hr tablet Take 1 tablet (500 mg total) by mouth daily.   [DISCONTINUED] levothyroxine  (SYNTHROID ) 75 MCG tablet TAKE 1 TABLET BY MOUTH  DAILY BEFORE BREAKFAST   [DISCONTINUED] metFORMIN  (GLUCOPHAGE -XR) 500 MG 24 hr tablet Take 1 tablet (500 mg total) by mouth daily.   No facility-administered encounter medications on file as of 08/11/2024.     Lab Results  Component Value Date   WBC 8.0 04/07/2024   HGB 13.9 04/07/2024   HCT 43.3 04/07/2024   PLT 304 04/07/2024   GLUCOSE 111 (H) 08/05/2024   CHOL 153 08/05/2024   TRIG 162 (H) 08/05/2024   HDL 51 08/05/2024   LDLDIRECT 135.3 07/20/2014   LDLCALC 74 08/05/2024   ALT 16 08/05/2024   AST 18 08/05/2024   NA 137 08/05/2024   K 4.3 08/05/2024   CL 99 08/05/2024   CREATININE 1.02 (H) 08/05/2024   BUN 16 08/05/2024   CO2 21 08/05/2024   TSH 3.550 08/05/2024   HGBA1C 6.6 (H) 08/05/2024       Assessment & Plan:  Need for influenza vaccination -     Flu vaccine trivalent PF, 6mos and older(Flulaval,Afluria,Fluarix,Fluzone)  Hypercholesterolemia Assessment & Plan: Continue Crestor .  Low-cholesterol diet and exercise.  Follow lipid panel liver function testing.  Orders: -     Lipid panel; Future -     Hepatic function panel; Future -     TSH; Future  Type 2  diabetes mellitus with hyperglycemia, without long-term current use of insulin (HCC) Assessment & Plan: Continue low carb diet. Discussed diet and exercise. Continue metformin  XR. Follow met b and A1c.  Lab Results  Component Value Date   HGBA1C 6.6 (H) 08/05/2024     Orders: -     Hemoglobin A1c; Future  Essential hypertension Assessment & Plan: Blood pressure as outlined.  Continue lisinopril .  Follow pressures. Doing well. No change in medication today.   Orders: -     Basic metabolic panel with GFR; Future  Stress Assessment & Plan: Continues on citalopram . Has buspar  if needed. Follow.    Severe obesity (BMI >= 40) (HCC) Assessment & Plan: Continue diet and exercise.    Moderate obstructive sleep apnea Assessment & Plan: Continue cpap.    Hyperlipidemia due to dietary fat intake Assessment & Plan: Currently on crestor  20mg  q day.  Low cholesterol diet and exercise.  Follow lipid panel and liver function tests. No change in medication today.  Lab Results  Component Value Date   CHOL 153 08/05/2024   HDL 51 08/05/2024   LDLCALC 74 08/05/2024   LDLDIRECT 135.3 07/20/2014   TRIG 162 (H) 08/05/2024   CHOLHDL 3.0 08/05/2024      Other orders -     Levothyroxine  Sodium; TAKE 1 TABLET BY MOUTH  DAILY BEFORE BREAKFAST  Dispense: 90 tablet; Refill: 3 -     metFORMIN  HCl ER; Take 1 tablet (500 mg total) by mouth daily.  Dispense: 90  tablet; Refill: 1 -     busPIRone  HCl; Take 1 tablet (5 mg total) by mouth 2 (two) times daily as needed.  Dispense: 60 tablet; Refill: 0     Allena Hamilton, MD

## 2024-08-16 ENCOUNTER — Encounter: Payer: Self-pay | Admitting: Internal Medicine

## 2024-08-16 NOTE — Assessment & Plan Note (Signed)
 Continues on citalopram . Has buspar  if needed. Follow.

## 2024-08-16 NOTE — Assessment & Plan Note (Signed)
 Continue cpap.

## 2024-08-16 NOTE — Assessment & Plan Note (Signed)
 Continue low carb diet. Discussed diet and exercise. Continue metformin  XR. Follow met b and A1c.  Lab Results  Component Value Date   HGBA1C 6.6 (H) 08/05/2024

## 2024-08-16 NOTE — Assessment & Plan Note (Signed)
 Blood pressure as outlined.  Continue lisinopril .  Follow pressures. Doing well. No change in medication today.

## 2024-08-16 NOTE — Assessment & Plan Note (Signed)
 Continue diet and exercise.

## 2024-08-16 NOTE — Assessment & Plan Note (Signed)
 Currently on crestor  20mg  q day.  Low cholesterol diet and exercise.  Follow lipid panel and liver function tests. No change in medication today.  Lab Results  Component Value Date   CHOL 153 08/05/2024   HDL 51 08/05/2024   LDLCALC 74 08/05/2024   LDLDIRECT 135.3 07/20/2014   TRIG 162 (H) 08/05/2024   CHOLHDL 3.0 08/05/2024

## 2024-08-16 NOTE — Assessment & Plan Note (Signed)
 Continue Crestor.  Low-cholesterol diet and exercise.  Follow lipid panel liver function testing.

## 2024-09-14 ENCOUNTER — Other Ambulatory Visit: Payer: Self-pay | Admitting: Internal Medicine

## 2024-09-15 ENCOUNTER — Other Ambulatory Visit: Payer: Self-pay | Admitting: Internal Medicine

## 2024-12-14 ENCOUNTER — Other Ambulatory Visit

## 2024-12-16 ENCOUNTER — Encounter: Admitting: Internal Medicine
